# Patient Record
Sex: Female | Born: 1960 | Race: White | Hispanic: No | Marital: Married | Smoking: Never smoker
Health system: Southern US, Community
[De-identification: ages and names within clinical notes are randomized; demographics above are authoritative.]

## PROBLEM LIST (undated history)

## (undated) DIAGNOSIS — R51 Headache: Secondary | ICD-10-CM

## (undated) DIAGNOSIS — E785 Hyperlipidemia, unspecified: Secondary | ICD-10-CM

## (undated) DIAGNOSIS — M8580 Other specified disorders of bone density and structure, unspecified site: Secondary | ICD-10-CM

## (undated) HISTORY — PX: COLONOSCOPY: SHX174

## (undated) HISTORY — DX: Headache: R51

## (undated) HISTORY — DX: Hyperlipidemia, unspecified: E78.5

## (undated) HISTORY — DX: Other specified disorders of bone density and structure, unspecified site: M85.80

---

## 1996-11-10 HISTORY — PX: VAGINAL HYSTERECTOMY: SUR661

## 1998-04-06 ENCOUNTER — Other Ambulatory Visit: Admission: RE | Admit: 1998-04-06 | Discharge: 1998-04-06 | Payer: Self-pay | Admitting: Obstetrics and Gynecology

## 1999-11-05 ENCOUNTER — Ambulatory Visit (HOSPITAL_COMMUNITY): Admission: RE | Admit: 1999-11-05 | Discharge: 1999-11-05 | Payer: Self-pay | Admitting: Obstetrics and Gynecology

## 1999-11-05 ENCOUNTER — Encounter: Payer: Self-pay | Admitting: Obstetrics and Gynecology

## 2000-11-05 ENCOUNTER — Encounter: Payer: Self-pay | Admitting: Obstetrics and Gynecology

## 2000-11-05 ENCOUNTER — Ambulatory Visit (HOSPITAL_COMMUNITY): Admission: RE | Admit: 2000-11-05 | Discharge: 2000-11-05 | Payer: Self-pay | Admitting: Obstetrics and Gynecology

## 2001-04-27 ENCOUNTER — Other Ambulatory Visit: Admission: RE | Admit: 2001-04-27 | Discharge: 2001-04-27 | Payer: Self-pay | Admitting: Obstetrics and Gynecology

## 2001-11-12 ENCOUNTER — Encounter: Payer: Self-pay | Admitting: Obstetrics and Gynecology

## 2001-11-12 ENCOUNTER — Ambulatory Visit (HOSPITAL_COMMUNITY): Admission: RE | Admit: 2001-11-12 | Discharge: 2001-11-12 | Payer: Self-pay | Admitting: Obstetrics and Gynecology

## 2002-04-27 ENCOUNTER — Other Ambulatory Visit: Admission: RE | Admit: 2002-04-27 | Discharge: 2002-04-27 | Payer: Self-pay | Admitting: Obstetrics and Gynecology

## 2002-11-14 ENCOUNTER — Encounter: Payer: Self-pay | Admitting: Obstetrics and Gynecology

## 2002-11-14 ENCOUNTER — Encounter: Admission: RE | Admit: 2002-11-14 | Discharge: 2002-11-14 | Payer: Self-pay | Admitting: Obstetrics and Gynecology

## 2003-05-04 ENCOUNTER — Other Ambulatory Visit: Admission: RE | Admit: 2003-05-04 | Discharge: 2003-05-04 | Payer: Self-pay | Admitting: Obstetrics and Gynecology

## 2003-11-20 ENCOUNTER — Ambulatory Visit (HOSPITAL_COMMUNITY): Admission: RE | Admit: 2003-11-20 | Discharge: 2003-11-20 | Payer: Self-pay | Admitting: Obstetrics and Gynecology

## 2004-05-06 ENCOUNTER — Other Ambulatory Visit: Admission: RE | Admit: 2004-05-06 | Discharge: 2004-05-06 | Payer: Self-pay | Admitting: Obstetrics and Gynecology

## 2004-11-27 ENCOUNTER — Encounter: Admission: RE | Admit: 2004-11-27 | Discharge: 2004-11-27 | Payer: Self-pay | Admitting: Obstetrics and Gynecology

## 2005-02-27 ENCOUNTER — Ambulatory Visit: Payer: Self-pay | Admitting: Family Medicine

## 2005-05-07 ENCOUNTER — Other Ambulatory Visit: Admission: RE | Admit: 2005-05-07 | Discharge: 2005-05-07 | Payer: Self-pay | Admitting: Addiction Medicine

## 2005-11-10 HISTORY — PX: OOPHORECTOMY: SHX86

## 2005-11-10 HISTORY — PX: PELVIC LAPAROSCOPY: SHX162

## 2005-12-01 ENCOUNTER — Encounter: Admission: RE | Admit: 2005-12-01 | Discharge: 2005-12-01 | Payer: Self-pay | Admitting: Obstetrics and Gynecology

## 2006-05-08 ENCOUNTER — Other Ambulatory Visit: Admission: RE | Admit: 2006-05-08 | Discharge: 2006-05-08 | Payer: Self-pay | Admitting: Obstetrics and Gynecology

## 2006-08-10 ENCOUNTER — Ambulatory Visit (HOSPITAL_BASED_OUTPATIENT_CLINIC_OR_DEPARTMENT_OTHER): Admission: RE | Admit: 2006-08-10 | Discharge: 2006-08-10 | Payer: Self-pay | Admitting: Obstetrics and Gynecology

## 2006-08-10 ENCOUNTER — Encounter (INDEPENDENT_AMBULATORY_CARE_PROVIDER_SITE_OTHER): Payer: Self-pay | Admitting: *Deleted

## 2006-12-03 ENCOUNTER — Encounter: Admission: RE | Admit: 2006-12-03 | Discharge: 2006-12-03 | Payer: Self-pay | Admitting: Obstetrics and Gynecology

## 2007-05-10 ENCOUNTER — Other Ambulatory Visit: Admission: RE | Admit: 2007-05-10 | Discharge: 2007-05-10 | Payer: Self-pay | Admitting: Obstetrics and Gynecology

## 2007-06-22 ENCOUNTER — Ambulatory Visit: Payer: Self-pay | Admitting: Internal Medicine

## 2007-07-06 ENCOUNTER — Ambulatory Visit: Payer: Self-pay | Admitting: Internal Medicine

## 2007-12-06 ENCOUNTER — Telehealth (INDEPENDENT_AMBULATORY_CARE_PROVIDER_SITE_OTHER): Payer: Self-pay | Admitting: Internal Medicine

## 2007-12-07 ENCOUNTER — Encounter: Admission: RE | Admit: 2007-12-07 | Discharge: 2007-12-07 | Payer: Self-pay | Admitting: Obstetrics and Gynecology

## 2007-12-08 ENCOUNTER — Ambulatory Visit: Payer: Self-pay | Admitting: Family Medicine

## 2007-12-08 DIAGNOSIS — R51 Headache: Secondary | ICD-10-CM | POA: Insufficient documentation

## 2007-12-08 DIAGNOSIS — K209 Esophagitis, unspecified without bleeding: Secondary | ICD-10-CM | POA: Insufficient documentation

## 2007-12-08 DIAGNOSIS — R519 Headache, unspecified: Secondary | ICD-10-CM | POA: Insufficient documentation

## 2007-12-13 ENCOUNTER — Telehealth (INDEPENDENT_AMBULATORY_CARE_PROVIDER_SITE_OTHER): Payer: Self-pay | Admitting: Internal Medicine

## 2007-12-14 ENCOUNTER — Ambulatory Visit: Payer: Self-pay | Admitting: Family Medicine

## 2008-01-12 ENCOUNTER — Telehealth (INDEPENDENT_AMBULATORY_CARE_PROVIDER_SITE_OTHER): Payer: Self-pay | Admitting: Internal Medicine

## 2008-02-02 ENCOUNTER — Encounter (INDEPENDENT_AMBULATORY_CARE_PROVIDER_SITE_OTHER): Payer: Self-pay | Admitting: Internal Medicine

## 2008-04-25 ENCOUNTER — Telehealth (INDEPENDENT_AMBULATORY_CARE_PROVIDER_SITE_OTHER): Payer: Self-pay | Admitting: Internal Medicine

## 2008-05-10 ENCOUNTER — Other Ambulatory Visit: Admission: RE | Admit: 2008-05-10 | Discharge: 2008-05-10 | Payer: Self-pay | Admitting: Obstetrics and Gynecology

## 2008-05-25 ENCOUNTER — Ambulatory Visit: Payer: Self-pay | Admitting: Family Medicine

## 2008-08-28 ENCOUNTER — Ambulatory Visit: Payer: Self-pay | Admitting: Family Medicine

## 2008-12-07 ENCOUNTER — Encounter: Admission: RE | Admit: 2008-12-07 | Discharge: 2008-12-07 | Payer: Self-pay | Admitting: Obstetrics and Gynecology

## 2009-05-16 ENCOUNTER — Other Ambulatory Visit: Admission: RE | Admit: 2009-05-16 | Discharge: 2009-05-16 | Payer: Self-pay | Admitting: Obstetrics and Gynecology

## 2009-05-16 ENCOUNTER — Encounter: Payer: Self-pay | Admitting: Obstetrics and Gynecology

## 2009-05-16 ENCOUNTER — Ambulatory Visit: Payer: Self-pay | Admitting: Obstetrics and Gynecology

## 2009-10-15 ENCOUNTER — Ambulatory Visit: Payer: Self-pay | Admitting: Family Medicine

## 2009-10-15 ENCOUNTER — Telehealth: Payer: Self-pay | Admitting: Family Medicine

## 2009-10-15 ENCOUNTER — Encounter: Payer: Self-pay | Admitting: Family Medicine

## 2009-10-15 DIAGNOSIS — R1013 Epigastric pain: Secondary | ICD-10-CM | POA: Insufficient documentation

## 2009-10-15 LAB — CONVERTED CEMR LAB
AST: 19 units/L (ref 0–37)
Basophils Relative: 0.6 % (ref 0.0–3.0)
Eosinophils Absolute: 0.1 10*3/uL (ref 0.0–0.7)
HCT: 39.5 % (ref 36.0–46.0)
Hemoglobin: 13.5 g/dL (ref 12.0–15.0)
Lipase: 10 units/L — ABNORMAL LOW (ref 11.0–59.0)
Lymphocytes Relative: 37.5 % (ref 12.0–46.0)
MCHC: 34.1 g/dL (ref 30.0–36.0)
MCV: 96.4 fL (ref 78.0–100.0)
Monocytes Absolute: 0.5 10*3/uL (ref 0.1–1.0)
Neutro Abs: 2.5 10*3/uL (ref 1.4–7.7)
Neutrophils Relative %: 49.2 % (ref 43.0–77.0)
Platelets: 131 10*3/uL — ABNORMAL LOW (ref 150.0–400.0)
RBC: 4.09 M/uL (ref 3.87–5.11)
Total Bilirubin: 0.8 mg/dL (ref 0.3–1.2)
Total Protein: 6.9 g/dL (ref 6.0–8.3)

## 2009-10-17 LAB — CONVERTED CEMR LAB
HDL: 72.5 mg/dL (ref >39.00)
Total CHOL/HDL Ratio: 3
Triglycerides: 71 mg/dL (ref 0.0–149.0)

## 2009-12-10 ENCOUNTER — Encounter: Admission: RE | Admit: 2009-12-10 | Discharge: 2009-12-10 | Payer: Self-pay | Admitting: Obstetrics and Gynecology

## 2010-05-30 ENCOUNTER — Ambulatory Visit: Payer: Self-pay | Admitting: Obstetrics and Gynecology

## 2010-05-30 ENCOUNTER — Other Ambulatory Visit: Admission: RE | Admit: 2010-05-30 | Discharge: 2010-05-30 | Payer: Self-pay | Admitting: Obstetrics and Gynecology

## 2010-11-30 ENCOUNTER — Other Ambulatory Visit: Payer: Self-pay | Admitting: Obstetrics and Gynecology

## 2010-11-30 DIAGNOSIS — Z0000 Encounter for general adult medical examination without abnormal findings: Secondary | ICD-10-CM

## 2010-12-11 ENCOUNTER — Ambulatory Visit
Admission: RE | Admit: 2010-12-11 | Discharge: 2010-12-11 | Disposition: A | Discharge status: Home / Self Care | Payer: Self-pay | Source: Ambulatory Visit | Attending: Obstetrics and Gynecology | Admitting: Obstetrics and Gynecology

## 2010-12-11 DIAGNOSIS — Z0000 Encounter for general adult medical examination without abnormal findings: Secondary | ICD-10-CM

## 2011-01-23 ENCOUNTER — Ambulatory Visit (INDEPENDENT_AMBULATORY_CARE_PROVIDER_SITE_OTHER): Payer: Self-pay | Admitting: Family Medicine

## 2011-01-23 ENCOUNTER — Encounter: Payer: Self-pay | Admitting: Family Medicine

## 2011-01-23 DIAGNOSIS — J018 Other acute sinusitis: Secondary | ICD-10-CM

## 2011-01-28 NOTE — Assessment & Plan Note (Signed)
Summary: COUGH,COLD/CLE  AETNA   Vital Signs:  Patient profile:   50 year old female Height:      64 inches Weight:      121.25 pounds BMI:     20.89 Temp:     98.3 degrees F Pulse rate:   80 / minute Pulse rhythm:   regular BP sitting:   110 / 80  (left arm) Cuff size:   regular  Vitals Entered ByMelody Comas (January 23, 2011 9:09 AM) CC: cough, congestion   History of Present Illness: 50 yo here with 2 weeks of cough and congestion.  Started with runny nose, dry cough. Now has sinus pressure, productive cough, body aches. No fevers or chills. No CP, wheezing or SOB.    Current Medications (verified): 1)  Budeprion Xl 150 Mg  Tb24 (Bupropion Hcl) .... Take 1 Tablet By Mouth Once A Day 2)  Baclofen 10 Mg Tabs (Baclofen) .... Take 1 Tablet By Mouth Once A Day 3)  Topamax 25 Mg Tabs (Topiramate) .... Take 2 By Mouth Once Daily 4)  Calcium Carbonate-Vitamin D 600-400 Mg-Unit  Tabs (Calcium Carbonate-Vitamin D) .... Take 1 Tablet By Mouth Once A Day 5)  Fish Oil   Oil (Fish Oil) .... Take 1 Tablet By Mouth Once A Day 6)  Multivitamins   Tabs (Multiple Vitamin) .... Take 1 Tablet By Mouth Once A Day 7)  Tussionex Pennkinetic Er 8-10 Mg/27ml Lqcr (Chlorpheniramine-Hydrocodone) .... 5 Ml By Mouth Two Times A Day As Needed Cough 8)  Amoxicillin 875 Mg Tabs (Amoxicillin) .Marland Kitchen.. 1 Tab By Mouth Two Times A Day X 10 Days  Allergies (verified): No Known Drug Allergies  Past History:  Social History: Last updated: 12/08/2007 Marital Status: Married Children: 2 adult children Occupation:   Risk Factors: Alcohol Use: <1 (12/08/2007) Caffeine Use: 1 (12/08/2007) Exercise: yes (12/08/2007)  Risk Factors: Smoking Status: never (12/08/2007) Passive Smoke Exposure: no (12/08/2007)  Review of Systems      See HPI General:  Complains of malaise; denies chills and fever. ENT:  Complains of postnasal drainage, sinus pressure, and sore throat. CV:  Denies chest pain or  discomfort. Resp:  Complains of cough and sputum productive; denies shortness of breath and wheezing.  Physical Exam  General:  alert, well-developed, and well-nourished.  NAD VSS Ears:  TMs retracted bilaterally Nose:  boggy turbinates, frontal and maxillary sinuses TTP Mouth:  pharyngeal erythema.   Lungs:  Normal respiratory effort, chest expands symmetrically. Lungs are clear to auscultation, no crackles or wheezes. Heart:  normal rate, regular rhythm, no murmur, and no gallop.   Extremities:  No clubbing, cyanosis, edema, or deformity noted with normal full range of motion of all joints.   Neurologic:  alert & oriented X3 and gait normal.   Skin:  Intact without suspicious lesions or rashes Psych:  Cognition and judgment appear intact. Alert and cooperative with normal attention span and concentration. No apparent delusions, illusions, hallucinations   Impression & Recommendations:  Problem # 1:  OTHER ACUTE SINUSITIS (ICD-461.8) Assessment New Given duration and progression of symptoms, will treat for bacterial sinusitis with Amoxcillin x 10 days. Supportive care as per pt instructions. Her updated medication list for this problem includes:    Tussionex Pennkinetic Er 8-10 Mg/33ml Lqcr (Chlorpheniramine-hydrocodone) .Marland KitchenMarland KitchenMarland KitchenMarland Kitchen 5 ml by mouth two times a day as needed cough    Amoxicillin 875 Mg Tabs (Amoxicillin) .Marland Kitchen... 1 tab by mouth two times a day x 10 days  Complete Medication List: 1)  Budeprion Xl 150 Mg Tb24 (Bupropion hcl) .... Take 1 tablet by mouth once a day 2)  Baclofen 10 Mg Tabs (Baclofen) .... Take 1 tablet by mouth once a day 3)  Topamax 25 Mg Tabs (Topiramate) .... Take 2 by mouth once daily 4)  Calcium Carbonate-vitamin D 600-400 Mg-unit Tabs (Calcium carbonate-vitamin d) .... Take 1 tablet by mouth once a day 5)  Fish Oil Oil (Fish oil) .... Take 1 tablet by mouth once a day 6)  Multivitamins Tabs (Multiple vitamin) .... Take 1 tablet by mouth once a day 7)   Tussionex Pennkinetic Er 8-10 Mg/49ml Lqcr (Chlorpheniramine-hydrocodone) .... 5 ml by mouth two times a day as needed cough 8)  Amoxicillin 875 Mg Tabs (Amoxicillin) .Marland Kitchen.. 1 tab by mouth two times a day x 10 days  Patient Instructions: 1)  Take amoxicillin as directed.  Drink lots of fluids.  Treat sympotmatically with Mucinex, nasal saline irrigation, and Tylenol/Ibuprofen. Also try claritin D or zyrtec D over the counter- two times a day as needed ( have to sign for them at pharmacy). You can use warm compresses.  Cough suppressant at night. Call if not improving as expected in 5-7 days.  Prescriptions: AMOXICILLIN 875 MG TABS (AMOXICILLIN) 1 tab by mouth two times a day x 10 days  #20 x 0   Entered and Authorized by:   Ruthe Mannan MD   Signed by:   Ruthe Mannan MD on 01/23/2011   Method used:   Electronically to        [facility] [address]* (retail)       [address] [place], [place]  [address]       Ph: [phone]       Fax: [phone]   RxID:   [id number] Sandria Senter ER 8-10 MG/5ML LQCR (CHLORPHENIRAMINE-HYDROCODONE) 5 ml by mouth two times a day as needed cough  #5 ounces x 0   Entered and Authorized by:   Ruthe Mannan MD   Signed by:   Ruthe Mannan MD on 01/23/2011   Method used:   Print then Give to Patient   RxID:   [id number]    Orders Added: 1)  Est. Patient Level III [59563]    Current Allergies (reviewed today): No known allergies

## 2011-03-28 NOTE — Op Note (Signed)
Darlene Spencer, Darlene Spencer                ACCOUNT NO.:  [id number]   MEDICAL RECORD NO.:  [id number]          PATIENT TYPE:  AMB   LOCATION:  [facility]                         FACILITY:  [facility]   PHYSICIAN:  Daniel L. Gottsegen, M.D.DATE OF BIRTH:  11-Dec-1960   DATE OF PROCEDURE:  08/10/2006  DATE OF DISCHARGE:                                 OPERATIVE REPORT   PREOPERATIVE DIAGNOSIS:  Right lower quadrant pain, right ovarian neoplasm,  question endometriosis.   POSTOPERATIVE DIAGNOSES:  1. Right lower quadrant pain, right ovarian neoplasm, question      endometriosis.  2. Pelvic adhesions.   NAME OF OPERATION:  Diagnostic laparoscopy. with lysis of pelvic adhesions,  bilateral salpingo-oophorectomy.   SURGEON:  Daniel L. Eda Paschal, M.D.   FIRST ASSISTANT:  Timothy P. Fontaine, M.D.   INDICATIONS:  The patient is a 50 year old female who had presented to the  office with persistent chronic right lower quadrant pain.  She had been  ultrasounded, which showed enlargement of her right ovary, consistent with  an endometrioma, and she did have a previous history of endometriosis.  Her  pain has persisted.  She was re-ultrasounded. The ovarian neoplasm had not  changed in size, and therefore it was elected to proceed with surgery.  Although the patient is only 65, she is postmenopausal.  We had discussed  pros and cons of removing both her ovaries, and she has elected to do both.   FINDINGS:  At the time of surgery, the patient's right ovary was twice as  large is normal.  It appeared consistent with an endometrioma, although we  never opened the specimen, so we could not be sure.  The right ovary was  adherent to the appendix, which also could explain some of the patient's  pain.  The appendix itself appeared to be normal other than its adherence to  the ovary and tube.  The left ovary and tube were normal.  There was a  segment of sigmoid colon adherent into the cul-de-sac, and there was an  area  of endometriosis on the left broad ligament.   PROCEDURE:  After adequate general anesthesia, the patient was placed in  dorsal lithotomy position, prepped and draped in the usual sterile manner.  A Foley catheter was inserted into the patient's bladder, and a sponge stick  was placed in the vagina.  A midline vertical incision was made, and then  the laparoscope was introduced directly using Opti Vision for an atraumatic  entry.  Pneumoperitoneum was created. Two 5 mm ports were placed in the  pelvis in the right and left lower quadrant.  Peritoneal washings were first  obtained.  At this point, the appendix was dissected free from the ovary and  tube.  Bipolar power was used for control of bleeding, as well as a sharp  scissors, and care was taken not to actually cauterize the appendix itself.  At the termination of this, the appendix was now free.  It appeared normal.  The right ureter could be identified, and then the right ovary and tube with  neoplasm was removed using bipolar  current for control of bleeding.  It was  completely separated from all its broad ligament attachments.  Attention was  the next turned to the left side.  We could not see her left ovary and tube  until the sigmoid colon could be dissected free from the bladder flap.  This  was done without difficulty.  The left ureter could then be identified.  The  left ovary and tube were then removed using bipolar current to separate the  attachments, starting at the IP ligament.  This was done atraumatically and  without bleeding.  The one area of endometriosis was cauterized, elevated,  to keep it away from the ureter.  At this point, a 5 mm scope was placed in  the right lower quadrant.  An Endopouch was placed subumbilically, and both  specimens were removed and then separated.  The pelvis was reinspected.  There was no bleeding noted.  All cul-de-sac fluid was removed.  Pneumoperitoneum was evacuated.  The  subumbilical fascial incision was  closed with 0 Vicryl, and then the three skin incisions were closed with  Dermabond.  The patient tolerated the procedure well and left the operating  room in satisfactory condition, draining clear urine from her Foley  catheter.      Daniel L. Eda Paschal, M.D.  Electronically Signed     DLG/MEDQ  D:  08/10/2006  T:  08/10/2006  Job:  [id number]

## 2011-04-18 ENCOUNTER — Encounter: Payer: Self-pay | Admitting: Family Medicine

## 2011-04-21 ENCOUNTER — Other Ambulatory Visit: Payer: Self-pay

## 2011-04-21 ENCOUNTER — Ambulatory Visit (INDEPENDENT_AMBULATORY_CARE_PROVIDER_SITE_OTHER)
Admission: RE | Admit: 2011-04-21 | Discharge: 2011-04-21 | Disposition: A | Discharge status: Home / Self Care | Payer: Medicare HMO | Source: Ambulatory Visit | Attending: Family Medicine | Admitting: Family Medicine

## 2011-04-21 ENCOUNTER — Encounter: Payer: Self-pay | Admitting: Family Medicine

## 2011-04-21 ENCOUNTER — Ambulatory Visit (INDEPENDENT_AMBULATORY_CARE_PROVIDER_SITE_OTHER): Payer: Medicare HMO | Admitting: Family Medicine

## 2011-04-21 VITALS — BP 110/80 | HR 80 | Temp 97.8°F | Wt 122.5 lb

## 2011-04-21 DIAGNOSIS — M549 Dorsalgia, unspecified: Secondary | ICD-10-CM

## 2011-04-21 DIAGNOSIS — M5442 Lumbago with sciatica, left side: Secondary | ICD-10-CM | POA: Insufficient documentation

## 2011-04-21 DIAGNOSIS — Z136 Encounter for screening for cardiovascular disorders: Secondary | ICD-10-CM

## 2011-04-21 NOTE — Progress Notes (Signed)
Addended by: Alvina Chou on: 04/21/2011 12:52 PM   Modules accepted: Orders

## 2011-04-21 NOTE — Patient Instructions (Addendum)
Try to avoid painful activities  as much as possible. Aleve and/or tylenol as needed for pain Let's get xrays today. If not improving at follow-up we will consider ultrasound and/or physical therapy.

## 2011-04-21 NOTE — Progress Notes (Signed)
50 yo here for upper back, neck pain.  Started acutely 1-2 months ago. No known injury or trauma. Feels like it is getting worse.  Sometimes, achy feeling radiates down one or both arms. No UE weakness. No cough, CP or SOB.  Has only tried ASA for it because she was worried it could be her heart when pains radiated down her arm. ASA did not help.  Works at a [employer] account [employer] all day.  ROS: See HPI.  The PMH, PSH, Social History, Family History, Medications, and allergies have been reviewed in [facility], and have been updated if relevant.  Physical exam: BP 110/80  Pulse 80  Temp(Src) 97.8 F (36.6 C) (Oral)  Wt 122 lb 8 oz (55.566 kg) Gen:  Alert, pleasant, NAD Resp:  CTA bilaterally CVS:  RRR, no mrg Msk:  spurling neg, no tightness of paraspinous muscles. Neg arch, neg empty can. Neuro:  UE  Strength normal bilaterally, sensation in tact.  1. Back pain   New. Seems most consistent with arthritis vs other vertebral issue. Will get xray of thoracic and cervical spine today. Symptoms not consistent with cardiac, EKG also NSR today. See pt instructions for details.

## 2011-06-11 ENCOUNTER — Ambulatory Visit (INDEPENDENT_AMBULATORY_CARE_PROVIDER_SITE_OTHER): Payer: Managed Care, Other (non HMO) | Admitting: Obstetrics and Gynecology

## 2011-06-11 ENCOUNTER — Encounter: Payer: Self-pay | Admitting: Obstetrics and Gynecology

## 2011-06-11 ENCOUNTER — Other Ambulatory Visit (HOSPITAL_COMMUNITY)
Admission: RE | Admit: 2011-06-11 | Discharge: 2011-06-11 | Disposition: A | Discharge status: Home / Self Care | Payer: Managed Care, Other (non HMO) | Source: Ambulatory Visit | Attending: Obstetrics and Gynecology | Admitting: Obstetrics and Gynecology

## 2011-06-11 DIAGNOSIS — E789 Disorder of lipoprotein metabolism, unspecified: Secondary | ICD-10-CM

## 2011-06-11 DIAGNOSIS — Z13220 Encounter for screening for lipoid disorders: Secondary | ICD-10-CM

## 2011-06-11 DIAGNOSIS — Z01419 Encounter for gynecological examination (general) (routine) without abnormal findings: Secondary | ICD-10-CM | POA: Insufficient documentation

## 2011-06-11 DIAGNOSIS — Z0000 Encounter for general adult medical examination without abnormal findings: Secondary | ICD-10-CM

## 2011-06-11 NOTE — Progress Notes (Signed)
Addended by: Trellis Paganini on: 06/11/2011 02:51 PM   Modules accepted: Orders

## 2011-06-11 NOTE — Progress Notes (Signed)
06/11/2011: Shaianne's LDL is now up to 176. She continues to have a very good HDL 89. I called her today and I think there is room for her to improve her diet. She will work on this and return in 4 months for followup lipid profile.

## 2011-06-11 NOTE — Progress Notes (Signed)
The patient came to see me today for her annual GYN exam. She is stopped her hormone replacement and although she is symptomatic she seems to be okay without it.she does have some vaginal dryness with intercourse but a lubricant is adequate. She is up-to-date on mammograms and bone density. Her entire past history, family history, social history, and ROS was reviewed and recorded.  Physical exam: HEENT within normal limits. Neck shows no masses. Breasts were examined both sitting and lying. There are no skin changes, they are symmetrical, and there are no masses. Abdomen is soft without guarding rebound or masses. External genitalia are normal. BUN  is normal.vaginal exam shows slightly decreased estrogen effect. Cervix and uterus are absent. Adnexa fails to reveal masses. Rectovaginal exam is confirmatory. Extremities within normal limits.  Impression normal GYN exam, some menopausal symptoms.  Plan: For the moment the patient will stay off her estrogen. Her symptoms discussed above start when she discontinued her estrogen. If she wants to go back on estrogen for symptoms we will try a patch. We check a lipid profile today because she's had a slightly elevated LDL in the past. She will continue yearly mammograms.

## 2011-07-06 ENCOUNTER — Emergency Department (HOSPITAL_COMMUNITY): Payer: Managed Care, Other (non HMO)

## 2011-07-06 ENCOUNTER — Observation Stay (HOSPITAL_COMMUNITY)
Admission: EM | Admit: 2011-07-06 | Discharge: 2011-07-07 | Disposition: A | Discharge status: Home / Self Care | Payer: Managed Care, Other (non HMO) | Attending: Internal Medicine | Admitting: Internal Medicine

## 2011-07-06 DIAGNOSIS — R0989 Other specified symptoms and signs involving the circulatory and respiratory systems: Secondary | ICD-10-CM | POA: Insufficient documentation

## 2011-07-06 DIAGNOSIS — E785 Hyperlipidemia, unspecified: Secondary | ICD-10-CM | POA: Insufficient documentation

## 2011-07-06 DIAGNOSIS — R079 Chest pain, unspecified: Principal | ICD-10-CM | POA: Insufficient documentation

## 2011-07-06 DIAGNOSIS — R110 Nausea: Secondary | ICD-10-CM | POA: Insufficient documentation

## 2011-07-06 DIAGNOSIS — R0609 Other forms of dyspnea: Secondary | ICD-10-CM | POA: Insufficient documentation

## 2011-07-06 DIAGNOSIS — G43909 Migraine, unspecified, not intractable, without status migrainosus: Secondary | ICD-10-CM | POA: Insufficient documentation

## 2011-07-06 LAB — CBC
HCT: 37.9 % (ref 36.0–46.0)
Hemoglobin: 13 g/dL (ref 12.0–15.0)
RDW: 12.8 % (ref 11.5–15.5)
WBC: 10.2 10*3/uL (ref 4.0–10.5)

## 2011-07-06 LAB — BASIC METABOLIC PANEL
CO2: 28 mEq/L (ref 19–32)
Calcium: 9.2 mg/dL (ref 8.4–10.5)
Sodium: 141 mEq/L (ref 135–145)

## 2011-07-06 LAB — DIFFERENTIAL
Basophils Absolute: 0 10*3/uL (ref 0.0–0.1)
Lymphocytes Relative: 5 % — ABNORMAL LOW (ref 12–46)
Neutro Abs: 9 10*3/uL — ABNORMAL HIGH (ref 1.7–7.7)
Neutrophils Relative %: 88 % — ABNORMAL HIGH (ref 43–77)

## 2011-07-06 LAB — CARDIAC PANEL(CRET KIN+CKTOT+MB+TROPI)
CK, MB: 2.9 ng/mL (ref 0.3–4.0)
CK, MB: 1.9 ng/mL (ref 0.3–4.0)
Total CK: 120 U/L (ref 7–177)
Troponin I: <0.3 ng/mL (ref <0.30)

## 2011-07-06 LAB — URINALYSIS, MICROSCOPIC ONLY
Glucose, UA: NEGATIVE mg/dL
Hgb urine dipstick: NEGATIVE
Protein, ur: NEGATIVE mg/dL
Specific Gravity, Urine: 1.002 — ABNORMAL LOW (ref 1.005–1.030)
pH: 6 (ref 5.0–8.0)

## 2011-07-06 LAB — POCT I-STAT TROPONIN I

## 2011-07-06 LAB — CK TOTAL AND CKMB (NOT AT ARMC)
CK, MB: 3.4 ng/mL (ref 0.3–4.0)
Total CK: 204 U/L — ABNORMAL HIGH (ref 7–177)

## 2011-07-06 LAB — TSH: TSH: 0.63 u[IU]/mL (ref 0.350–4.500)

## 2011-07-07 LAB — HEMOGLOBIN A1C: Mean Plasma Glucose: 117 mg/dL — ABNORMAL HIGH (ref <117)

## 2011-07-07 LAB — BASIC METABOLIC PANEL
BUN: 15 mg/dL (ref 6–23)
Chloride: 105 mEq/L (ref 96–112)
Glucose, Bld: 97 mg/dL (ref 70–99)
Potassium: 3.1 mEq/L — ABNORMAL LOW (ref 3.5–5.1)
Sodium: 137 mEq/L (ref 135–145)

## 2011-07-07 LAB — CBC
MCH: 31.5 pg (ref 26.0–34.0)
MCHC: 35.2 g/dL (ref 30.0–36.0)
Platelets: 108 10*3/uL — ABNORMAL LOW (ref 150–400)
RDW: 12.7 % (ref 11.5–15.5)

## 2011-07-07 LAB — LIPID PANEL: Cholesterol: 199 mg/dL (ref 0–200)

## 2011-07-23 NOTE — H&P (Signed)
NAMEMARIGOLD, Darlene Spencer NO.:  [id number]  MEDICAL RECORD NO.:  [id number]  LOCATION:  3712                         FACILITY:  [facility]  PHYSICIAN:  Peggye Pitt, M.D. DATE OF BIRTH:  1961/08/23  DATE OF ADMISSION:  07/06/2011 DATE OF DISCHARGE:                             HISTORY & PHYSICAL   PRIMARY CARE PHYSICIAN:  Ruthe Mannan, MD with [facility].  CHIEF COMPLAINT:  Chest pain.  HISTORY OF PRESENT ILLNESS:  Ms.  Spencer is a very pleasant 50 year old white woman with past medical history only significant for diet- controlled hyperlipidemia and migraine headaches, who presents to the emergency department today with right-sided chest pain that began at around 3 o'clock in the morning.  She states the chest pain woke her up from sleep again at around 3:00 a.m.  Chest pain was located on the right upper chest without radiation.  She states that pain lasted for approximately 3 hours before it disappeared.  She was given aspirin and nitroglycerin in the emergency department and did not have significant relief with either medication.  She did have some mild shortness of breath associated with the chest pain and said that she felt better sitting up, then she did lying down.  She also had some nausea, however, no frank emesis.  She waited at home for about an hour and then finally her husband advised her to come to the emergency department.  We are asked to admit her for further evaluation.  ALLERGIES:  No known drug allergies.  PAST MEDICAL HISTORY:  Significant only for migraine headaches and diet- controlled hyperlipidemia.  HOME MEDICATIONS:  Topamax 10 mg at bedtime.  She also takes baclofen, she is not sure of the dose.  SOCIAL HISTORY:  She denies any alcohol, tobacco, or illicit drug use. She is married and lives with her husband.  FAMILY HISTORY:  Significant for a father who died at age 51 of what was presumed to be a heart attack.  No other  history of heart disease, cancer, strokes, or blood clots.  REVIEW OF SYSTEMS:  Negative except as mentioned in history of present illness.  PHYSICAL EXAMINATION:  VITAL SIGNS:  On admission, blood pressure 98/70, heart rate 81, respirations 15, temperature of 98.6, sats of 99% on 2 liters. GENERAL:  She is alert, awake, and oriented x3 in no distress. HEENT:  Normocephalic, atraumatic.  Her pupils are equal, round, and reactive to light.  She has intact extraocular movements. NECK:  Supple.  No JVD, no lymphadenopathy, no bruits, no goiter. HEART:  Regular rate and rhythm without murmurs, rubs, or gallops. LUNGS:  Clear to auscultation bilaterally. ABDOMEN:  Soft, nontender, nondistended with positive bowel sounds. EXTREMITIES:  No clubbing, cyanosis or edema with positive pulses. NEUROLOGIC:  Grossly intact and nonfocal.  LABORATORY DATA ON ADMISSION:  Sodium 141, potassium 3.6, chloride 106, bicarb 28, BUN 22, creatinine 0.78, glucose of 92.  WBC is 10.2, hemoglobin 13.0, platelets of 129.  First set of cardiac enzymes is normal with a total CK of 204, CK-MB of 3.4, and a troponin 0.01.  A chest x-ray shows no acute distress.  An EKG which unfortunately is not available  for me to review at this time, however, the ED physician, in his notes says that it is normal.  ASSESSMENT AND PLAN: 1. Chest pain.  This is very atypical for coronary artery disease.     She also has no coronary artery disease risk factors other than her     family history and hyperlipidemia.  At this time, I will admit her     as an observation to rule out acute coronary syndrome.  I will     cycle cardiac enzymes and EKGs and we will order 1 stat to     evaluate.  She states her chest pain at time of onset, felt better     with sitting forward which would definitely make her believe that     this is more pleuritic in nature.  A chest x-ray was normal as     stated above.  CT scan of the chest to rule out PE  will not be     ordered as she has very low pretest probability for pulmonary     embolus by Wells criteria, she also denies any calf pain or     swelling.  If she rules out, I will discharge her home in the     morning.  I do not believe she requires a stress test either     inpatient or outpatient at this time. 2. For her diet-controlled hyperlipidemia, we will recheck a fasting     lipid profile in the morning. 3. For migraine headaches, we will continue Topamax and baclofen at     home doses once med rec is performed by pharmacy. 4. For deep vein thrombosis prophylaxis, I will place her on Lovenox.     Peggye Pitt, M.D.     EH/MEDQ  D:  07/06/2011  T:  07/06/2011  Job:  [id number]  cc:   Ruthe Mannan, M.D.  Electronically Signed by Peggye Pitt M.D. on 07/23/2011 02:24:26 PM

## 2011-07-23 NOTE — Discharge Summary (Signed)
  NAMEMADDISEN, Spencer NO.:  [id number]  MEDICAL RECORD NO.:  [id number]  LOCATION:  3712                         FACILITY:  [facility]  PHYSICIAN:  Peggye Pitt, M.D. DATE OF BIRTH:  1960/12/05  DATE OF ADMISSION:  07/06/2011 DATE OF DISCHARGE:  07/07/2011                              DISCHARGE SUMMARY   PRIMARY CARE PHYSICIAN:  Ruthe Mannan, MD  DISCHARGE DIAGNOSES: 1. Chest pain, ruled out for acute coronary syndrome. 2. Diet-controlled hyperlipidemia. 3. Migraine headaches.  DISCHARGE MEDICATIONS: 1. Aspirin 81 mg daily. 2. Baclofen 10 mg at bedtime. 3. Os-Cal D 1 tablet daily. 4. Fish oil over the counter 1 capsule three times a day. 5. Multivitamin 1 tablet daily. 6. Topamax 25 mg, of which she takes 2 tablets twice daily. 7. Wellbutrin 150 mg daily.  DISPOSITION AND FOLLOWUP:  Darlene Spencer will be discharged home today in stable and improved condition.  She will need to follow up with her primary care physician within 2 weeks after discharge.  CONSULTATION THIS HOSPITALIZATION:  None.  IMAGES AND PROCEDURES:  A chest x-ray on July 06, 2011, that showed no acute cardiopulmonary process.  HISTORY AND PHYSICAL:  For complete details, please refer to history and physical on July 06, 2011, but in brief, Darlene Spencer is a pleasant 50- year-old white woman with past medical history only significant for diet- controlled hyperlipidemia and migraine headaches who presented to the emergency department with chest pain that woke her up at about 3 o'clock in the morning.  Her coronary artery disease risk factors include hyperlipidemia and family history and because of this, we are asked to admit her for further evaluation and management.  HOSPITAL COURSE BY PROBLEM:  Chest pain.  She is now ruled out for acute coronary syndrome by the way of two sets of negative cardiac enzymes and EKGs that demonstrated no acute ST or T-wave changes.  Given her only minimal  coronary artery disease risk factors and that her chest pain is very typical, I believe that she does not require further inpatient or outpatient cardiac workup.  We also did not perform a CT of the chest to rule out pulmonary embolism given her very low pretest probability for PE.  She is instructed to follow up with her primary care physician for any further recommendations.  She has been started on a baby aspirin daily.  VITAL SIGNS ON DAY OF DISCHARGE:  Blood pressure 100/65, heart rate 69, respirations 18, sats 97% on room air, and temperature 98.4.     Peggye Pitt, M.D.     EH/MEDQ  D:  07/07/2011  T:  07/07/2011  Job:  [id number]  cc:   Ruthe Mannan, M.D.  Electronically Signed by Peggye Pitt M.D. on 07/23/2011 02:24:18 PM

## 2011-09-29 ENCOUNTER — Other Ambulatory Visit: Payer: Self-pay

## 2011-09-29 MED ORDER — FLUCONAZOLE 200 MG PO TABS: 200.0000 mg | ORAL_TABLET | Freq: Every day | ORAL | Status: AC

## 2011-09-29 NOTE — Telephone Encounter (Signed)
PT GOING ON ANOTHER TRIP THIS YEAR. REQUESTING DIFLUCAN PILLS. YOU GAVE HER GENERIC 200MG  #5 ALMOST 1 YEAR AGO FOR THE SAME REQUEST.

## 2011-09-29 NOTE — Telephone Encounter (Signed)
That is fine 

## 2011-09-29 NOTE — Telephone Encounter (Signed)
PT NOTIFIED RX SENT IN.

## 2011-11-12 ENCOUNTER — Other Ambulatory Visit: Payer: Self-pay | Admitting: Obstetrics and Gynecology

## 2011-11-12 DIAGNOSIS — Z1231 Encounter for screening mammogram for malignant neoplasm of breast: Secondary | ICD-10-CM

## 2011-12-15 ENCOUNTER — Ambulatory Visit: Payer: Managed Care, Other (non HMO)

## 2011-12-22 ENCOUNTER — Ambulatory Visit
Admission: RE | Admit: 2011-12-22 | Discharge: 2011-12-22 | Disposition: A | Discharge status: Home / Self Care | Payer: Managed Care, Other (non HMO) | Source: Ambulatory Visit | Attending: Obstetrics and Gynecology | Admitting: Obstetrics and Gynecology

## 2011-12-22 DIAGNOSIS — Z1231 Encounter for screening mammogram for malignant neoplasm of breast: Secondary | ICD-10-CM

## 2012-06-14 ENCOUNTER — Other Ambulatory Visit: Payer: Self-pay | Admitting: Obstetrics and Gynecology

## 2012-06-14 ENCOUNTER — Encounter: Payer: Managed Care, Other (non HMO) | Admitting: Obstetrics and Gynecology

## 2012-07-02 ENCOUNTER — Encounter: Payer: Self-pay | Admitting: Obstetrics and Gynecology

## 2012-07-02 ENCOUNTER — Ambulatory Visit (INDEPENDENT_AMBULATORY_CARE_PROVIDER_SITE_OTHER): Payer: Managed Care, Other (non HMO) | Admitting: Obstetrics and Gynecology

## 2012-07-02 VITALS — BP 116/70 | Ht 64.0 in | Wt 121.0 lb

## 2012-07-02 DIAGNOSIS — Z01419 Encounter for gynecological examination (general) (routine) without abnormal findings: Secondary | ICD-10-CM

## 2012-07-02 LAB — CBC WITH DIFFERENTIAL/PLATELET
Eosinophils Absolute: 0.1 10*3/uL (ref 0.0–0.7)
Eosinophils Relative: 3 % (ref 0–5)
HCT: 39.4 % (ref 36.0–46.0)
Lymphocytes Relative: 41 % (ref 12–46)
Lymphs Abs: 1.6 10*3/uL (ref 0.7–4.0)
MCH: 30.7 pg (ref 26.0–34.0)
MCV: 88.3 fL (ref 78.0–100.0)
Monocytes Absolute: 0.4 10*3/uL (ref 0.1–1.0)
Monocytes Relative: 10 % (ref 3–12)
Platelets: 161 10*3/uL (ref 150–400)
RBC: 4.46 MIL/uL (ref 3.87–5.11)
WBC: 3.8 10*3/uL — ABNORMAL LOW (ref 4.0–10.5)

## 2012-07-02 LAB — LIPID PANEL
HDL: 72 mg/dL (ref >39)
LDL Cholesterol: 157 mg/dL — ABNORMAL HIGH (ref 0–99)
Total CHOL/HDL Ratio: 3.3 Ratio
VLDL: 10 mg/dL (ref 0–40)

## 2012-07-02 MED ORDER — BUPROPION HCL ER (XL) 150 MG PO TB24: 150.0000 mg | ORAL_TABLET | Freq: Every day | ORAL | Status: DC

## 2012-07-02 NOTE — Patient Instructions (Signed)
Call me for sleep medication if necessary.

## 2012-07-02 NOTE — Progress Notes (Signed)
Patient came to see me today for her annual GYN exam. She stopped her oral estradiol approximately 8 months ago and is doing well without it. She is up-to-date on mammograms. Normal bone density in August, 2009. She has always had normal Pap smears. Her last Pap smear was 2012. Two years ago her cholesterol was slightly elevated with a total to 224 and LDL of 139. Last year it was better with a total of 199 and an LDL of 107. She is having trouble with sleep disturbance. She has no trouble falling  asleep but wakes up after 4 hours and cannot go back to bed. She does not wake up with menopausal symptoms.  She had a vaginal hysterectomy in 1998 for endometriosis. She had a diagnostic laparoscopy with bilateral salpingo-oophorectomy in 2007 for endometriosis. She is having no vaginal bleeding. She is having no pelvic pain.  HEENT: Within normal limits. Kennon Portela present. Neck: No masses. Supraclavicular lymph nodes: Not enlarged. Breasts: Examined in both sitting and lying position. Symmetrical without skin changes or masses. Abdomen: Soft no masses guarding or rebound. No hernias. Pelvic: External within normal limits. BUS within normal limits. Vaginal examination shows good estrogen effect, no cystocele enterocele or rectocele. Cervix and uterus absent. Adnexa within normal limits. Rectovaginal confirmatory. Extremities within normal limits.  Assessment: #1. Sleep disturbance-unlikely menopausal. #2. Endometriosis #3. Slightly elevated cholesterol.  Plan: Discussed Sonata or Intermezzo. She will check cost and let me know. Discussed neurology consult. Continue Wellbutrin. Cholesterol checked-patient's fasted.The new Pap smear guidelines were discussed with the patient. No pap done. Plan followup bone density next year after she has been off her estrogen longer.

## 2012-07-03 LAB — URINALYSIS W MICROSCOPIC + REFLEX CULTURE
Casts: NONE SEEN
Glucose, UA: NEGATIVE mg/dL
Nitrite: NEGATIVE
Protein, ur: NEGATIVE mg/dL
Squamous Epithelial / LPF: NONE SEEN
pH: 7.5 (ref 5.0–8.0)

## 2012-07-06 ENCOUNTER — Other Ambulatory Visit: Payer: Self-pay | Admitting: Obstetrics and Gynecology

## 2012-07-06 DIAGNOSIS — N390 Urinary tract infection, site not specified: Secondary | ICD-10-CM

## 2012-07-06 LAB — URINE CULTURE: Colony Count: 75000

## 2012-07-06 MED ORDER — SULFAMETHOXAZOLE-TRIMETHOPRIM 800-160 MG PO TABS: 1.0000 | ORAL_TABLET | Freq: Two times a day (BID) | ORAL | Status: AC

## 2012-11-22 ENCOUNTER — Other Ambulatory Visit: Payer: Self-pay | Admitting: Gynecology

## 2012-11-22 DIAGNOSIS — Z1231 Encounter for screening mammogram for malignant neoplasm of breast: Secondary | ICD-10-CM

## 2012-12-22 ENCOUNTER — Ambulatory Visit: Payer: Managed Care, Other (non HMO)

## 2012-12-31 ENCOUNTER — Ambulatory Visit
Admission: RE | Admit: 2012-12-31 | Discharge: 2012-12-31 | Disposition: A | Discharge status: Home / Self Care | Payer: Managed Care, Other (non HMO) | Source: Ambulatory Visit | Attending: Gynecology | Admitting: Gynecology

## 2012-12-31 DIAGNOSIS — Z1231 Encounter for screening mammogram for malignant neoplasm of breast: Secondary | ICD-10-CM

## 2013-01-03 ENCOUNTER — Telehealth: Payer: Self-pay | Admitting: *Deleted

## 2013-01-03 MED ORDER — FLUCONAZOLE 150 MG PO TABS: 150.0000 mg | ORAL_TABLET | ORAL | Status: DC | PRN

## 2013-01-03 NOTE — Telephone Encounter (Signed)
(  Dr.G patient) she will leaving for a cruise in a month for 5 days. She asked if you would be willing to prescribe diflucan a couple of pills for her to take on her trip? She gets yeast infections due to being in the water a lot, and would like to be able to enjoy her trip. Please advise

## 2013-01-03 NOTE — Telephone Encounter (Signed)
rx sent

## 2013-01-03 NOTE — Telephone Encounter (Signed)
Okay for Diflucan 150 mg #3 one by mouth when necessary yeast infection

## 2013-01-04 NOTE — Telephone Encounter (Signed)
Pt informed with the below note. 

## 2013-05-09 ENCOUNTER — Ambulatory Visit (INDEPENDENT_AMBULATORY_CARE_PROVIDER_SITE_OTHER): Payer: Managed Care, Other (non HMO) | Admitting: Family Medicine

## 2013-05-09 ENCOUNTER — Encounter: Payer: Self-pay | Admitting: Family Medicine

## 2013-05-09 VITALS — BP 122/82 | HR 68 | Temp 98.4°F | Wt 127.0 lb

## 2013-05-09 DIAGNOSIS — Z136 Encounter for screening for cardiovascular disorders: Secondary | ICD-10-CM

## 2013-05-09 DIAGNOSIS — R079 Chest pain, unspecified: Secondary | ICD-10-CM | POA: Insufficient documentation

## 2013-05-09 LAB — LIPASE: Lipase: 26 U/L (ref 11.0–59.0)

## 2013-05-09 LAB — LIPID PANEL
Cholesterol: 235 mg/dL — ABNORMAL HIGH (ref 0–200)
Total CHOL/HDL Ratio: 3
VLDL: 25 mg/dL (ref 0.0–40.0)

## 2013-05-09 LAB — COMPREHENSIVE METABOLIC PANEL
AST: 18 U/L (ref 0–37)
Albumin: 4.1 g/dL (ref 3.5–5.2)
Alkaline Phosphatase: 74 U/L (ref 39–117)
Glucose, Bld: 83 mg/dL (ref 70–99)
Potassium: 3.7 mEq/L (ref 3.5–5.1)
Sodium: 138 mEq/L (ref 135–145)
Total Bilirubin: 0.4 mg/dL (ref 0.3–1.2)
Total Protein: 7.3 g/dL (ref 6.0–8.3)

## 2013-05-09 LAB — CBC
MCHC: 33 g/dL (ref 30.0–36.0)
MCV: 93.5 fl (ref 78.0–100.0)
Platelets: 125 10*3/uL — ABNORMAL LOW (ref 150.0–400.0)
RDW: 14.1 % (ref 11.5–14.6)

## 2013-05-09 LAB — LDL CHOLESTEROL, DIRECT: Direct LDL: 139.1 mg/dL

## 2013-05-09 NOTE — Progress Notes (Signed)
52 yo here for upper back/chest, neck pain.   I last saw her in June 2012 and it was for a similar complaint.  This episode started acutely over the weekend.  She was not exerting herself.  Had not lifted anything heavy, no trauma.  Right scapular pain, also with left jaw pain and headache.  Some chest tightness.  Not worsened by exertion.  Lasted all day Saturday and through Sunday.  Now only has dull headache.  Does have FH of CAD and h/o HLD.  Lab Results  Component Value Date   CHOL 239* 07/02/2012   HDL 72 07/02/2012   LDLCALC 157* 07/02/2012   LDLDIRECT 127.0 10/15/2009   TRIG 52 07/02/2012   CHOLHDL 3.3 07/02/2012     No UE weakness.  No cough, or SOB.  No nausea, vomiting or diaphoresis   ROS:  See HPI.   The PMH, PSH, Social History, Family History, Medications, and allergies have been reviewed in [facility], and have been updated if relevant.  Physical exam:  BP 122/82  Pulse 68  Temp(Src) 98.4 F (36.9 C)  Wt 127 lb (57.607 kg)  BMI 21.79 kg/m2  LMP 11/10/1996  Gen: Alert, pleasant, NAD  Resp: CTA bilaterally  CVS: RRR, no mrg  Msk: spurling neg, no tightness of paraspinous muscles.  Neg arch, neg empty can.  Neuro: UE Strength normal bilaterally, sensation in tact.   Assessment and Plan: 1. Chest pain radiating to jaw Recurrent, deteriorated. ?GERD but cannot rule out cardiac issue. EKG reassuring- pos for bradycardia only (she is physically active). Check labs today, refer to cards for further work up/stress test. The patient indicates understanding of these issues and agrees with the plan.  - EKG 12-Lead - CBC - Lipase - Comprehensive metabolic panel - Ambulatory referral to Cardiology  2. Screening for ischemic heart disease  - Lipid Panel

## 2013-05-09 NOTE — Patient Instructions (Addendum)
Good to see you. I will call you with your lab results.  Please stop by to see Shirlee Limerick on your way out to set up your referral.

## 2013-05-10 ENCOUNTER — Encounter: Payer: Self-pay | Admitting: Family Medicine

## 2013-05-26 ENCOUNTER — Telehealth: Payer: Self-pay | Admitting: *Deleted

## 2013-05-26 ENCOUNTER — Encounter: Payer: Self-pay | Admitting: Cardiovascular Disease

## 2013-05-26 ENCOUNTER — Ambulatory Visit (INDEPENDENT_AMBULATORY_CARE_PROVIDER_SITE_OTHER): Payer: Managed Care, Other (non HMO) | Admitting: Cardiovascular Disease

## 2013-05-26 VITALS — BP 120/90 | HR 53 | Ht 64.0 in | Wt 127.8 lb

## 2013-05-26 DIAGNOSIS — R079 Chest pain, unspecified: Secondary | ICD-10-CM

## 2013-05-26 DIAGNOSIS — E785 Hyperlipidemia, unspecified: Secondary | ICD-10-CM

## 2013-05-26 DIAGNOSIS — R0602 Shortness of breath: Secondary | ICD-10-CM

## 2013-05-26 MED ORDER — ATORVASTATIN CALCIUM 10 MG PO TABS: 10.0000 mg | ORAL_TABLET | Freq: Every day | ORAL | Status: DC

## 2013-05-26 NOTE — Assessment & Plan Note (Addendum)
Etiology of her chest pain is uncertain though somewhat atypical for angina. She has been exercising since that time with no recurrent or reproducible symptoms. What is concerning is her strong family history of CAD. We spent a significant amount of time discussing treadmill testing, stress echo, also discussed calcium score, and the benefits of each test . She's interested in the CT heart calcium score and we will arrange this in [place] This will help guide whether she has underlying CAD and medical management of her hyperlipidemia and any further testing.Marland Kitchen

## 2013-05-26 NOTE — Patient Instructions (Addendum)
You are doing well. Please start generic lipitor every other day for a few weeks,  Then increase to daily  We will check lipids and liver in three months  We will setup a cardiac Calcium score (CT) in [place]  Please call us if you have new issues that need to be addressed before your next appt.  Your physician wants you to follow-up in: 12 months.  You will receive a reminder letter in the mail two months in advance. If you don't receive a letter, please call our office to schedule the follow-up appointment.

## 2013-05-26 NOTE — Progress Notes (Signed)
Patient ID: Darlene Spencer, female    DOB: [birth year], 52 y.o.   MRN: [id number]  HPI Comments: Darlene Spencer is a very pleasant 52 year old woman with history of hyperlipidemia, strong family history of heart disease with father who passed away from MI at 64, presenting by referral from Dr. Clifton Custard for chest pain, jaw pain, back pain.  She reports that several weeks ago, she had significant chest, jaw and back pain associated with headache. Symptoms started on a Saturday, continued until Sunday late in the day. She took several aspirin. Her headache persisted for at least one week. Since then she has been walking several times per week. She is normal regular walking at moderate level intensity with no recent symptoms of chest pain or shortness of breath.  She did have an episode of chest pain several years ago. She went to the hospital, had lab workup. No stress test done. Everything at that time was reportedly normal. She did not have any subsequent chest pain after that episode.  EKG shows sinus bradycardia with rate 53 beats per minute, no significant ST or T wave changes Total cholesterol 235, LDL 139     Outpatient Encounter Prescriptions as of 05/26/2013  Medication Sig Dispense Refill  . Calcium Carbonate-Vitamin D (TH CALCIUM CARBONATE-VITAMIN D) 600-400 MG-UNIT per tablet Take 1 tablet by mouth daily.        . fish oil-omega-3 fatty acids 1000 MG capsule Take 1 g by mouth daily.        . Multiple Vitamin (MULTIVITAMIN) tablet Take 1 tablet by mouth daily.        Marland Kitchen topiramate (TOPAMAX) 25 MG tablet Take 25 mg by mouth 2 (two) times daily.          Review of Systems  Constitutional: Negative.   HENT: Negative.   Eyes: Negative.   Respiratory: Negative.   Cardiovascular: Positive for chest pain.       Jaw pain, back pain episode  Gastrointestinal: Negative.   Musculoskeletal: Negative.   Skin: Negative.   Neurological: Negative.   Psychiatric/Behavioral: Negative.   All other  systems reviewed and are negative.    BP 120/90  Pulse 53  Ht 5\' 4"  (1.626 m)  Wt 127 lb 12 oz (57.947 kg)  BMI 21.92 kg/m2  LMP 11/10/1996  Physical Exam  Nursing note and vitals reviewed. Constitutional: She is oriented to person, place, and time. She appears well-developed and well-nourished.  HENT:  Head: Normocephalic.  Nose: Nose normal.  Mouth/Throat: Oropharynx is clear and moist.  Eyes: Conjunctivae are normal. Pupils are equal, round, and reactive to light.  Neck: Normal range of motion. Neck supple. No JVD present.  Cardiovascular: Normal rate, regular rhythm, S1 normal, S2 normal, normal heart sounds and intact distal pulses.  Exam reveals no gallop and no friction rub.   No murmur heard. Pulmonary/Chest: Effort normal and breath sounds normal. No respiratory distress. She has no wheezes. She has no rales. She exhibits no tenderness.  Abdominal: Soft. Bowel sounds are normal. She exhibits no distension. There is no tenderness.  Musculoskeletal: Normal range of motion. She exhibits no edema and no tenderness.  Lymphadenopathy:    She has no cervical adenopathy.  Neurological: She is alert and oriented to person, place, and time. Coordination normal.  Skin: Skin is warm and dry. No rash noted. No erythema.  Psychiatric: She has a normal mood and affect. Her behavior is normal. Judgment and thought content normal.    Assessment and Plan

## 2013-05-26 NOTE — Telephone Encounter (Signed)
Pt called back states "never mind".

## 2013-05-26 NOTE — Telephone Encounter (Signed)
Lipitor (generic) is $102.00 is there anything else she can try?

## 2013-05-26 NOTE — Assessment & Plan Note (Signed)
We have talked to her extensively about her hyperlipidemia, strong family history of CAD. Father died of MI in his 27s. She's going to start low-dose cholesterol medication. She will start generic Lipitor 10 mg every other day, titrating up to 10 mg daily. She has  lab work to be done through work at the end of August. Could repeat lipids with LFTs at the end of the year.

## 2013-05-26 NOTE — Telephone Encounter (Signed)
Please see below.

## 2013-05-29 NOTE — Telephone Encounter (Signed)
If generic lipitor expensive with her plan,  Could try simvastatin 20 mg daily Or pravastatin 20 mg daily

## 2013-05-31 ENCOUNTER — Other Ambulatory Visit: Payer: Self-pay | Admitting: Cardiovascular Disease

## 2013-05-31 DIAGNOSIS — R0602 Shortness of breath: Secondary | ICD-10-CM

## 2013-05-31 DIAGNOSIS — R079 Chest pain, unspecified: Secondary | ICD-10-CM

## 2013-06-06 ENCOUNTER — Ambulatory Visit (INDEPENDENT_AMBULATORY_CARE_PROVIDER_SITE_OTHER)
Admission: RE | Admit: 2013-06-06 | Discharge: 2013-06-06 | Disposition: A | Discharge status: Home / Self Care | Payer: Managed Care, Other (non HMO) | Source: Ambulatory Visit | Attending: Cardiovascular Disease | Admitting: Cardiovascular Disease

## 2013-06-06 DIAGNOSIS — R0602 Shortness of breath: Secondary | ICD-10-CM

## 2013-06-06 DIAGNOSIS — R079 Chest pain, unspecified: Secondary | ICD-10-CM

## 2013-06-30 ENCOUNTER — Encounter: Payer: Self-pay | Admitting: Cardiovascular Disease

## 2013-07-04 ENCOUNTER — Ambulatory Visit (INDEPENDENT_AMBULATORY_CARE_PROVIDER_SITE_OTHER): Payer: Managed Care, Other (non HMO) | Admitting: Women's Health

## 2013-07-04 ENCOUNTER — Encounter: Payer: Self-pay | Admitting: Women's Health

## 2013-07-04 VITALS — BP 120/70 | Ht 64.0 in | Wt 128.0 lb

## 2013-07-04 DIAGNOSIS — Z01419 Encounter for gynecological examination (general) (routine) without abnormal findings: Secondary | ICD-10-CM

## 2013-07-04 DIAGNOSIS — Z780 Asymptomatic menopausal state: Secondary | ICD-10-CM

## 2013-07-04 DIAGNOSIS — E785 Hyperlipidemia, unspecified: Secondary | ICD-10-CM | POA: Insufficient documentation

## 2013-07-04 DIAGNOSIS — E7800 Pure hypercholesterolemia, unspecified: Secondary | ICD-10-CM

## 2013-07-04 NOTE — Patient Instructions (Signed)
Health Recommendations for Postmenopausal Women Respected and ongoing research has looked at the most common causes of death, disability, and poor quality of life in postmenopausal women. The causes include heart disease, diseases of blood vessels, diabetes, depression, cancer, and bone loss (osteoporosis). Many things can be done to help lower the chances of developing these and other common problems: CARDIOVASCULAR DISEASE Heart Disease: A heart attack is a medical emergency. Know the signs and symptoms of a heart attack. Below are things women can do to reduce their risk for heart disease.   Do not smoke. If you smoke, quit.  Aim for a healthy weight. Being overweight causes many preventable deaths. Eat a healthy and balanced diet and drink an adequate amount of liquids.  Get moving. Make a commitment to be more physically active. Aim for 30 minutes of activity on most, if not all days of the week.  Eat for heart health. Choose a diet that is low in saturated fat and cholesterol and eliminate trans fat. Include whole grains, vegetables, and fruits. Read and understand the labels on food containers before buying.  Know your numbers. Ask your caregiver to check your blood pressure, cholesterol (total, HDL, LDL, triglycerides) and blood glucose. Work with your caregiver on improving your entire clinical picture.  High blood pressure. Limit or stop your table salt intake (try salt substitute and food seasonings). Avoid salty foods and drinks. Read labels on food containers before buying. Eating well and exercising can help control high blood pressure. STROKE  Stroke is a medical emergency. Stroke may be the result of a blood clot in a blood vessel in the brain or by a brain hemorrhage (bleeding). Know the signs and symptoms of a stroke. To lower the risk of developing a stroke:  Avoid fatty foods.  Quit smoking.  Control your diabetes, blood pressure, and irregular heart rate. THROMBOPHLEBITIS  (BLOOD CLOT) OF THE LEG  Becoming overweight and leading a stationary lifestyle may also contribute to developing blood clots. Controlling your diet and exercising will help lower the risk of developing blood clots. CANCER SCREENING  Breast Cancer: Take steps to reduce your risk of breast cancer.  You should practice "breast self-awareness." This means understanding the normal appearance and feel of your breasts and should include breast self-examination. Any changes detected, no matter how small, should be reported to your caregiver.  After age 40, you should have a clinical breast exam (CBE) every year.  Starting at age 40, you should consider having a mammogram (breast X-ray) every year.  If you have a family history of breast cancer, talk to your caregiver about genetic screening.  If you are at high risk for breast cancer, talk to your caregiver about having an MRI and a mammogram every year.  Intestinal or Stomach Cancer: Tests to consider are a rectal exam, fecal occult blood, sigmoidoscopy, and colonoscopy. Women who are high risk may need to be screened at an earlier age and more often.  Cervical Cancer:  Beginning at age 30, you should have a Pap test every 3 years as long as the past 3 Pap tests have been normal.  If you have had past treatment for cervical cancer or a condition that could lead to cancer, you need Pap tests and screening for cancer for at least 20 years after your treatment.  If you had a hysterectomy for a problem that was not cancer or a condition that could lead to cancer, then you no longer need Pap tests.    If you are between ages 65 and 70, and you have had normal Pap tests going back 10 years, you no longer need Pap tests.  If Pap tests have been discontinued, risk factors (such as a new sexual partner) need to be reassessed to determine if screening should be resumed.  Some medical problems can increase the chance of getting cervical cancer. In these  cases, your caregiver may recommend more frequent screening and Pap tests.  Uterine Cancer: If you have vaginal bleeding after reaching menopause, you should notify your caregiver.  Ovarian cancer: Other than yearly pelvic exams, there are no reliable tests available to screen for ovarian cancer at this time except for yearly pelvic exams.  Lung Cancer: Yearly chest X-rays can detect lung cancer and should be done on high risk women, such as cigarette smokers and women with chronic lung disease (emphysema).  Skin Cancer: A complete body skin exam should be done at your yearly examination. Avoid overexposure to the sun and ultraviolet light lamps. Use a strong sun block cream when in the sun. All of these things are important in lowering the risk of skin cancer. MENOPAUSE Menopause Symptoms: Hormone therapy products are effective for treating symptoms associated with menopause:  Moderate to severe hot flashes.  Night sweats.  Mood swings.  Headaches.  Tiredness.  Loss of sex drive.  Insomnia.  Other symptoms. Hormone replacement carries certain risks, especially in older women. Women who use or are thinking about using estrogen or estrogen with progestin treatments should discuss that with their caregiver. Your caregiver will help you understand the benefits and risks. The ideal dose of hormone replacement therapy is not known. The Food and Drug Administration (FDA) has concluded that hormone therapy should be used only at the lowest doses and for the shortest amount of time to reach treatment goals.  OSTEOPOROSIS Protecting Against Bone Loss and Preventing Fracture: If you use hormone therapy for prevention of bone loss (osteoporosis), the risks for bone loss must outweigh the risk of the therapy. Ask your caregiver about other medications known to be safe and effective for preventing bone loss and fractures. To guard against bone loss or fractures, the following is recommended:  If  you are less than age 50, take 1000 mg of calcium and at least 600 mg of Vitamin D per day.  If you are greater than age 50 but less than age 70, take 1200 mg of calcium and at least 600 mg of Vitamin D per day.  If you are greater than age 70, take 1200 mg of calcium and at least 800 mg of Vitamin D per day. Smoking and excessive alcohol intake increases the risk of osteoporosis. Eat foods rich in calcium and vitamin D and do weight bearing exercises several times a week as your caregiver suggests. DIABETES Diabetes Melitus: If you have Type I or Type 2 diabetes, you should keep your blood sugar under control with diet, exercise and recommended medication. Avoid too many sweets, starchy and fatty foods. Being overweight can make control more difficult. COGNITION AND MEMORY Cognition and Memory: Menopausal hormone therapy is not recommended for the prevention of cognitive disorders such as Alzheimer's disease or memory loss.  DEPRESSION  Depression may occur at any age, but is common in elderly women. The reasons may be because of physical, medical, social (loneliness), or financial problems and needs. If you are experiencing depression because of medical problems and control of symptoms, talk to your caregiver about this. Physical activity and   exercise may help with mood and sleep. Community and volunteer involvement may help your sense of value and worth. If you have depression and you feel that the problem is getting worse or becoming severe, talk to your caregiver about treatment options that are best for you. ACCIDENTS  Accidents are common and can be serious in the elderly woman. Prepare your house to prevent accidents. Eliminate throw rugs, place hand bars in the bath, shower and toilet areas. Avoid wearing high heeled shoes or walking on wet, snowy, and icy areas. Limit or stop driving if you have vision or hearing problems, or you feel you are unsteady with you movements and  reflexes. HEPATITIS C Hepatitis C is a type of viral infection affecting the liver. It is spread mainly through contact with blood from an infected person. It can be treated, but if left untreated, it can lead to severe liver damage over years. Many people who are infected do not know that the virus is in their blood. If you are a "baby-boomer", it is recommended that you have one screening test for Hepatitis C. IMMUNIZATIONS  Several immunizations are important to consider having during your senior years, including:   Tetanus, diptheria, and pertussis booster shot.  Influenza every year before the flu season begins.  Pneumonia vaccine.  Shingles vaccine.  Others as indicated based on your specific needs. Talk to your caregiver about these. Document Released: 12/19/2005 Document Revised: 10/13/2012 Document Reviewed: 08/14/2008 [employer] Patient Information 2014 [employer].  

## 2013-07-04 NOTE — Progress Notes (Signed)
Darlene Spencer 1961/05/02 [id number]    History:    The patient presents for annual exam.  Postmenopausal on no HRT with no complaints. 2009 Normal dexa T score -.7 left hip, FRAX 3.6/.1%. Was started on Lipitor 10mg  this past year for Chol of 230, last check lipid panel normal, total cholesterol 177, HDL 80, triglycerides 72, LDL 83. TVH 1998 for endometriosis, 2007 BSO endometriotic cyst. Negative colonoscopy 2008. Normal Pap and mammogram history.   Past medical history, past surgical history, family history and social history were all reviewed and documented in the EPIC chart. 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 Mother hypertension, father heart disease.   ROS:  A  ROS was performed and pertinent positives and negatives are included in the history.  Exam:  Filed Vitals:   07/04/13 0808  BP: 120/70    General appearance:  Normal Head/Neck:  Normal, without cervical or supraclavicular adenopathy. Thyroid:  Symmetrical, normal in size, without palpable masses or nodularity. Respiratory  Effort:  Normal  Auscultation:  Clear without wheezing or rhonchi Cardiovascular  Auscultation:  Regular rate, without rubs, murmurs or gallops  Edema/varicosities:  Not grossly evident Abdominal  Soft,nontender, without masses, guarding or rebound.  Liver/spleen:  No organomegaly noted  Hernia:  None appreciated  Skin  Inspection:  Grossly normal  Palpation:  Grossly normal Neurologic/psychiatric  Orientation:  Normal with appropriate conversation.  Mood/affect:  Normal  Genitourinary    Breasts: Examined lying and sitting.     Right: Without masses, retractions, discharge or axillary adenopathy.     Left: Without masses, retractions, discharge or axillary adenopathy.   Inguinal/mons:  Normal without inguinal adenopathy  External genitalia:  Normal  BUS/Urethra/Skene's glands:  Normal  Bladder:  Normal  Vagina:  Normal  Cervix/uterus: Absent  Adnexa/parametria:     Rt: Without masses or tenderness.   Lt: Without masses or tenderness.  Anus and perineum: Normal  Digital rectal exam: Normal sphincter tone without palpated masses or tenderness  Assessment/Plan:  52 y.o. M. WF G3P2  for annual exam with no complaints.  TVH with BSO for endometriosis on no HRT Normal DEXA 2009 Hypercholesteremia-primary care manages labs and meds.  Plan: Repeat DEXA, will schedule. Home safety, fall prevention importance of exercise reviewed. SBE's, continue annual mammogram, calcium rich diet, vitamin D 2000 daily encouraged. Continue to increase regular exercise. UA,  Pap normal 2012, new screening guidelines reviewed.    Harrington Challenger WHNP, 9:12 AM 07/04/2013

## 2013-07-05 LAB — URINALYSIS W MICROSCOPIC + REFLEX CULTURE
Casts: NONE SEEN
Glucose, UA: NEGATIVE mg/dL
Nitrite: NEGATIVE
Specific Gravity, Urine: 1.022 (ref 1.005–1.030)
Squamous Epithelial / LPF: NONE SEEN
pH: 5.5 (ref 5.0–8.0)

## 2013-07-18 ENCOUNTER — Encounter: Payer: Self-pay | Admitting: Obstetrics and Gynecology

## 2013-07-26 ENCOUNTER — Telehealth: Payer: Self-pay | Admitting: *Deleted

## 2013-07-26 MED ORDER — FLUCONAZOLE 150 MG PO TABS: 150.0000 mg | ORAL_TABLET | Freq: Once | ORAL | Status: DC

## 2013-07-26 NOTE — Telephone Encounter (Signed)
Ok to call in diflucan 150 with 3 refills, tell her to have a great time on the cruise and wear sunscreen!

## 2013-07-26 NOTE — Telephone Encounter (Signed)
Pt informed with the below note, rx sent. After e-sribed medication I realized the direction was take 1 po once. I called [facility] and told them it should be take 1 tablet prn as needed.

## 2013-07-26 NOTE — Telephone Encounter (Signed)
Pt will be leaving for a cruise in about 3 weeks, she is requesting some diflucan to have on hand in case a yeast infection would occur. Pt is requesting about 4 tablets. Had annual done in August. Please advise

## 2013-08-10 ENCOUNTER — Other Ambulatory Visit: Payer: Self-pay | Admitting: Gynecology

## 2013-08-10 DIAGNOSIS — Z780 Asymptomatic menopausal state: Secondary | ICD-10-CM

## 2013-08-10 DIAGNOSIS — M8580 Other specified disorders of bone density and structure, unspecified site: Secondary | ICD-10-CM

## 2013-08-10 DIAGNOSIS — Z13820 Encounter for screening for osteoporosis: Secondary | ICD-10-CM

## 2013-08-10 HISTORY — DX: Other specified disorders of bone density and structure, unspecified site: M85.80

## 2013-08-30 ENCOUNTER — Encounter: Payer: Self-pay | Admitting: Gynecology

## 2013-08-30 ENCOUNTER — Ambulatory Visit (INDEPENDENT_AMBULATORY_CARE_PROVIDER_SITE_OTHER): Payer: Managed Care, Other (non HMO)

## 2013-08-30 DIAGNOSIS — M899 Disorder of bone, unspecified: Secondary | ICD-10-CM

## 2013-08-30 DIAGNOSIS — Z780 Asymptomatic menopausal state: Secondary | ICD-10-CM

## 2013-08-30 DIAGNOSIS — Z13820 Encounter for screening for osteoporosis: Secondary | ICD-10-CM

## 2013-08-30 DIAGNOSIS — M8580 Other specified disorders of bone density and structure, unspecified site: Secondary | ICD-10-CM

## 2013-09-02 ENCOUNTER — Other Ambulatory Visit: Payer: Self-pay | Admitting: *Deleted

## 2013-09-02 DIAGNOSIS — M8580 Other specified disorders of bone density and structure, unspecified site: Secondary | ICD-10-CM

## 2013-09-15 ENCOUNTER — Other Ambulatory Visit: Payer: Self-pay

## 2013-09-20 ENCOUNTER — Other Ambulatory Visit: Payer: Managed Care, Other (non HMO)

## 2013-09-20 DIAGNOSIS — M8580 Other specified disorders of bone density and structure, unspecified site: Secondary | ICD-10-CM

## 2013-11-30 ENCOUNTER — Other Ambulatory Visit: Payer: Self-pay

## 2013-11-30 DIAGNOSIS — Z1231 Encounter for screening mammogram for malignant neoplasm of breast: Secondary | ICD-10-CM

## 2013-12-08 ENCOUNTER — Other Ambulatory Visit: Payer: Self-pay | Admitting: *Deleted

## 2013-12-08 ENCOUNTER — Other Ambulatory Visit: Payer: Self-pay | Admitting: Cardiovascular Disease

## 2013-12-08 MED ORDER — ATORVASTATIN CALCIUM 10 MG PO TABS: 10.0000 mg | ORAL_TABLET | Freq: Every day | ORAL | Status: DC

## 2013-12-08 NOTE — Telephone Encounter (Signed)
Requested Prescriptions   Signed Prescriptions Disp Refills  . atorvastatin (LIPITOR) 10 MG tablet 30 tablet 6    Sig: Take 1 tablet (10 mg total) by mouth daily.    Authorizing Provider: Minna Merritts    Ordering User: Britt Bottom

## 2014-01-03 ENCOUNTER — Ambulatory Visit
Admission: RE | Admit: 2014-01-03 | Discharge: 2014-01-03 | Disposition: A | Discharge status: Home / Self Care | Payer: Managed Care, Other (non HMO) | Source: Ambulatory Visit

## 2014-01-03 DIAGNOSIS — Z1231 Encounter for screening mammogram for malignant neoplasm of breast: Secondary | ICD-10-CM

## 2014-06-12 ENCOUNTER — Telehealth: Payer: Self-pay | Admitting: *Deleted

## 2014-06-12 MED ORDER — FLUCONAZOLE 150 MG PO TABS: 150.0000 mg | ORAL_TABLET | Freq: Every day | ORAL | Status: DC

## 2014-06-12 NOTE — Addendum Note (Signed)
Addended by: Alen Blew on: 06/12/2014 12:13 PM   Modules accepted: Orders

## 2014-06-12 NOTE — Telephone Encounter (Signed)
Pt informed. Rx sent KW CMA

## 2014-06-12 NOTE — Telephone Encounter (Signed)
Ok to refill Diflucan, tell her to enjoy cruise and wear sunscreen!

## 2014-06-12 NOTE — Telephone Encounter (Signed)
Pt has her annual exam scheduled with you Sept 3. Going on a cruise 8/22. Requesting Diflucan tablets (3) to take with her since she will be in her bathing suit. Pls advise. Thank u

## 2014-07-05 ENCOUNTER — Encounter: Payer: Managed Care, Other (non HMO) | Admitting: Women's Health

## 2014-07-13 ENCOUNTER — Encounter: Payer: Self-pay | Admitting: Women's Health

## 2014-07-13 ENCOUNTER — Ambulatory Visit (INDEPENDENT_AMBULATORY_CARE_PROVIDER_SITE_OTHER): Payer: Managed Care, Other (non HMO) | Admitting: Women's Health

## 2014-07-13 VITALS — BP 118/72 | Ht 64.0 in | Wt 115.0 lb

## 2014-07-13 DIAGNOSIS — Z13220 Encounter for screening for lipoid disorders: Secondary | ICD-10-CM

## 2014-07-13 DIAGNOSIS — B009 Herpesviral infection, unspecified: Secondary | ICD-10-CM

## 2014-07-13 DIAGNOSIS — Z01419 Encounter for gynecological examination (general) (routine) without abnormal findings: Secondary | ICD-10-CM

## 2014-07-13 LAB — COMPREHENSIVE METABOLIC PANEL
ALBUMIN: 4.5 g/dL (ref 3.5–5.2)
ALT: 17 U/L (ref 0–35)
AST: 18 U/L (ref 0–37)
Alkaline Phosphatase: 69 U/L (ref 39–117)
BUN: 15 mg/dL (ref 6–23)
CO2: 26 mEq/L (ref 19–32)
Calcium: 9.3 mg/dL (ref 8.4–10.5)
Chloride: 107 mEq/L (ref 96–112)
Creat: 0.89 mg/dL (ref 0.50–1.10)
GLUCOSE: 99 mg/dL (ref 70–99)
POTASSIUM: 3.8 meq/L (ref 3.5–5.3)
Sodium: 142 mEq/L (ref 135–145)
Total Bilirubin: 0.5 mg/dL (ref 0.2–1.2)
Total Protein: 7.1 g/dL (ref 6.0–8.3)

## 2014-07-13 LAB — CBC WITH DIFFERENTIAL/PLATELET
Basophils Absolute: 0.1 10*3/uL (ref 0.0–0.1)
Basophils Relative: 1 % (ref 0–1)
EOS ABS: 0.1 10*3/uL (ref 0.0–0.7)
Eosinophils Relative: 1 % (ref 0–5)
HCT: 40.7 % (ref 36.0–46.0)
Hemoglobin: 13.3 g/dL (ref 12.0–15.0)
LYMPHS ABS: 1.5 10*3/uL (ref 0.7–4.0)
LYMPHS PCT: 28 % (ref 12–46)
MCH: 30.9 pg (ref 26.0–34.0)
MCHC: 32.7 g/dL (ref 30.0–36.0)
MCV: 94.7 fL (ref 78.0–100.0)
Monocytes Absolute: 0.5 10*3/uL (ref 0.1–1.0)
Monocytes Relative: 9 % (ref 3–12)
NEUTROS PCT: 61 % (ref 43–77)
Neutro Abs: 3.4 10*3/uL (ref 1.7–7.7)
PLATELETS: 151 10*3/uL (ref 150–400)
RBC: 4.3 MIL/uL (ref 3.87–5.11)
RDW: 13.5 % (ref 11.5–15.5)
WBC: 5.5 10*3/uL (ref 4.0–10.5)

## 2014-07-13 LAB — LIPID PANEL
CHOLESTEROL: 170 mg/dL (ref 0–200)
HDL: 76 mg/dL (ref >39)
LDL Cholesterol: 83 mg/dL (ref 0–99)
Total CHOL/HDL Ratio: 2.2 Ratio
Triglycerides: 54 mg/dL (ref <150)
VLDL: 11 mg/dL (ref 0–40)

## 2014-07-13 MED ORDER — ACYCLOVIR 200 MG PO CAPS: 200.0000 mg | ORAL_CAPSULE | Freq: Every day | ORAL | Status: DC

## 2014-07-13 NOTE — Patient Instructions (Signed)
Health Recommendations for Postmenopausal Women Respected and ongoing research has looked at the most common causes of death, disability, and poor quality of life in postmenopausal women. The causes include heart disease, diseases of blood vessels, diabetes, depression, cancer, and bone loss (osteoporosis). Many things can be done to help lower the chances of developing these and other common problems. CARDIOVASCULAR DISEASE Heart Disease: A heart attack is a medical emergency. Know the signs and symptoms of a heart attack. Below are things women can do to reduce their risk for heart disease.   Do not smoke. If you smoke, quit.  Aim for a healthy weight. Being overweight causes many preventable deaths. Eat a healthy and balanced diet and drink an adequate amount of liquids.  Get moving. Make a commitment to be more physically active. Aim for 30 minutes of activity on most, if not all days of the week.  Eat for heart health. Choose a diet that is low in saturated fat and cholesterol and eliminate trans fat. Include whole grains, vegetables, and fruits. Read and understand the labels on food containers before buying.  Know your numbers. Ask your caregiver to check your blood pressure, cholesterol (total, HDL, LDL, triglycerides) and blood glucose. Work with your caregiver on improving your entire clinical picture.  High blood pressure. Limit or stop your table salt intake (try salt substitute and food seasonings). Avoid salty foods and drinks. Read labels on food containers before buying. Eating well and exercising can help control high blood pressure. STROKE  Stroke is a medical emergency. Stroke may be the result of a blood clot in a blood vessel in the brain or by a brain hemorrhage (bleeding). Know the signs and symptoms of a stroke. To lower the risk of developing a stroke:  Avoid fatty foods.  Quit smoking.  Control your diabetes, blood pressure, and irregular heart rate. THROMBOPHLEBITIS  (BLOOD CLOT) OF THE LEG  Becoming overweight and leading a stationary lifestyle may also contribute to developing blood clots. Controlling your diet and exercising will help lower the risk of developing blood clots. CANCER SCREENING  Breast Cancer: Take steps to reduce your risk of breast cancer.  You should practice "breast self-awareness." This means understanding the normal appearance and feel of your breasts and should include breast self-examination. Any changes detected, no matter how small, should be reported to your caregiver.  After age 40, you should have a clinical breast exam (CBE) every year.  Starting at age 40, you should consider having a mammogram (breast X-ray) every year.  If you have a family history of breast cancer, talk to your caregiver about genetic screening.  If you are at high risk for breast cancer, talk to your caregiver about having an MRI and a mammogram every year.  Intestinal or Stomach Cancer: Tests to consider are a rectal exam, fecal occult blood, sigmoidoscopy, and colonoscopy. Women who are high risk may need to be screened at an earlier age and more often.  Cervical Cancer:  Beginning at age 30, you should have a Pap test every 3 years as long as the past 3 Pap tests have been normal.  If you have had past treatment for cervical cancer or a condition that could lead to cancer, you need Pap tests and screening for cancer for at least 20 years after your treatment.  If you had a hysterectomy for a problem that was not cancer or a condition that could lead to cancer, then you no longer need Pap tests.    If you are between ages 65 and 70, and you have had normal Pap tests going back 10 years, you no longer need Pap tests.  If Pap tests have been discontinued, risk factors (such as a new sexual partner) need to be reassessed to determine if screening should be resumed.  Some medical problems can increase the chance of getting cervical cancer. In these  cases, your caregiver may recommend more frequent screening and Pap tests.  Uterine Cancer: If you have vaginal bleeding after reaching menopause, you should notify your caregiver.  Ovarian Cancer: Other than yearly pelvic exams, there are no reliable tests available to screen for ovarian cancer at this time except for yearly pelvic exams.  Lung Cancer: Yearly chest X-rays can detect lung cancer and should be done on high risk women, such as cigarette smokers and women with chronic lung disease (emphysema).  Skin Cancer: A complete body skin exam should be done at your yearly examination. Avoid overexposure to the sun and ultraviolet light lamps. Use a strong sun block cream when in the sun. All of these things are important for lowering the risk of skin cancer. MENOPAUSE Menopause Symptoms: Hormone therapy products are effective for treating symptoms associated with menopause:  Moderate to severe hot flashes.  Night sweats.  Mood swings.  Headaches.  Tiredness.  Loss of sex drive.  Insomnia.  Other symptoms. Hormone replacement carries certain risks, especially in older women. Women who use or are thinking about using estrogen or estrogen with progestin treatments should discuss that with their caregiver. Your caregiver will help you understand the benefits and risks. The ideal dose of hormone replacement therapy is not known. The Food and Drug Administration (FDA) has concluded that hormone therapy should be used only at the lowest doses and for the shortest amount of time to reach treatment goals.  OSTEOPOROSIS Protecting Against Bone Loss and Preventing Fracture If you use hormone therapy for prevention of bone loss (osteoporosis), the risks for bone loss must outweigh the risk of the therapy. Ask your caregiver about other medications known to be safe and effective for preventing bone loss and fractures. To guard against bone loss or fractures, the following is recommended:  If  you are younger than age 50, take 1000 mg of calcium and at least 600 mg of Vitamin D per day.  If you are older than age 50 but younger than age 70, take 1200 mg of calcium and at least 600 mg of Vitamin D per day.  If you are older than age 70, take 1200 mg of calcium and at least 800 mg of Vitamin D per day. Smoking and excessive alcohol intake increases the risk of osteoporosis. Eat foods rich in calcium and vitamin D and do weight bearing exercises several times a week as your caregiver suggests. DIABETES Diabetes Mellitus: If you have type I or type 2 diabetes, you should keep your blood sugar under control with diet, exercise, and recommended medication. Avoid starchy and fatty foods, and too many sweets. Being overweight can make diabetes control more difficult. COGNITION AND MEMORY Cognition and Memory: Menopausal hormone therapy is not recommended for the prevention of cognitive disorders such as Alzheimer's disease or memory loss.  DEPRESSION  Depression may occur at any age, but it is common in elderly women. This may be because of physical, medical, social (loneliness), or financial problems and needs. If you are experiencing depression because of medical problems and control of symptoms, talk to your caregiver about this. Physical   activity and exercise may help with mood and sleep. Community and volunteer involvement may improve your sense of value and worth. If you have depression and you feel that the problem is getting worse or becoming severe, talk to your caregiver about which treatment options are best for you. ACCIDENTS  Accidents are common and can be serious in elderly woman. Prepare your house to prevent accidents. Eliminate throw rugs, place hand bars in bath, shower, and toilet areas. Avoid wearing high heeled shoes or walking on wet, snowy, and icy areas. Limit or stop driving if you have vision or hearing problems, or if you feel you are unsteady with your movements and  reflexes. HEPATITIS C Hepatitis C is a type of viral infection affecting the liver. It is spread mainly through contact with blood from an infected person. It can be treated, but if left untreated, it can lead to severe liver damage over the years. Many people who are infected do not know that the virus is in their blood. If you are a "baby-boomer", it is recommended that you have one screening test for Hepatitis C. IMMUNIZATIONS  Several immunizations are important to consider having during your senior years, including:   Tetanus, diphtheria, and pertussis booster shot.  Influenza every year before the flu season begins.  Pneumonia vaccine.  Shingles vaccine.  Others, as indicated based on your specific needs. Talk to your caregiver about these. Document Released: 12/19/2005 Document Revised: 03/13/2014 Document Reviewed: 08/14/2008 [employer] Patient Information 2015 [employer]. This information is not intended to replace advice given to you by your health care provider. Make sure you discuss any questions you have with your health care provider.  

## 2014-07-13 NOTE — Progress Notes (Signed)
Darlene Spencer 05/20/1961 [id number]    History:    Presents for annual exam.  1988 TVH for endometriosis. 2007 BSO for endometrioma on no HRT. 2014 T score -1.1 and hip average FRAX 5%/0.4%, vitamin D 42. Started on Lipitor in 2014 by cardiologist. Negative colonoscopy 2008. History of normal Paps and mammograms.  Past medical history, past surgical history, family history and social history were all reviewed and documented in the EPIC chart. 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 Father heart disease, mother hypertension.  ROS:  A  12 point ROS was performed and pertinent positives and negatives are included.  Exam:  Filed Vitals:   07/13/14 0832  BP: 118/72    General appearance:  Normal Thyroid:  Symmetrical, normal in size, without palpable masses or nodularity. Respiratory  Auscultation:  Clear without wheezing or rhonchi Cardiovascular  Auscultation:  Regular rate, without rubs, murmurs or gallops  Edema/varicosities:  Not grossly evident Abdominal  Soft,nontender, without masses, guarding or rebound.  Liver/spleen:  No organomegaly noted  Hernia:  None appreciated  Skin  Inspection:  Grossly normal   Breasts: Examined lying and sitting.     Right: Without masses, retractions, discharge or axillary adenopathy.     Left: Without masses, retractions, discharge or axillary adenopathy. Gentitourinary   Inguinal/mons:  Normal without inguinal adenopathy  External genitalia:  Normal  BUS/Urethra/Skene's glands:  Normal  Vagina:  Normal  Cervix:  Absent  Uterus:  Absent  Adnexa/parametria:     Rt: Without masses or tenderness.   Lt: Without masses or tenderness.  Anus and perineum: Normal  Digital rectal exam: Normal sphincter tone without palpated masses or tenderness  Assessment/Plan:  53 y.o. MWF G2P2 for annual exam.    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 SBE's, continue annual 3-D mammogram history of dense breast. Continue regular exercise, calcium rich diet, vitamin D 2000 daily encouraged. CBC, comprehensive metabolic panel, lipid panel, UA,. Will take lab results to cardiologist for Lipitor refill.   Note: This dictation was prepared with Dragon/digital dictation.  Any transcriptional errors that result are unintentional. Huel [facility], 8:55 AM 07/13/2014

## 2014-07-14 LAB — URINALYSIS W MICROSCOPIC + REFLEX CULTURE
BILIRUBIN URINE: NEGATIVE
Bacteria, UA: NONE SEEN
CRYSTALS: NONE SEEN
Casts: NONE SEEN
Glucose, UA: NEGATIVE mg/dL
Hgb urine dipstick: NEGATIVE
Ketones, ur: NEGATIVE mg/dL
Nitrite: NEGATIVE
Protein, ur: NEGATIVE mg/dL
SPECIFIC GRAVITY, URINE: 1.02 (ref 1.005–1.030)
SQUAMOUS EPITHELIAL / LPF: NONE SEEN
UROBILINOGEN UA: 0.2 mg/dL (ref 0.0–1.0)
pH: 6 (ref 5.0–8.0)

## 2014-07-16 LAB — URINE CULTURE: Colony Count: 15000

## 2014-07-19 ENCOUNTER — Telehealth: Payer: Self-pay | Admitting: *Deleted

## 2014-07-19 NOTE — Telephone Encounter (Signed)
Spoke w/ pt.  Advised her that I had spoken w/ Dr. Rockey Situ who recommends that pt contact her PCP and see about getting them to refill meds.  She verbalizes understanding and will call back if we can be of further assistance.

## 2014-07-19 NOTE — Telephone Encounter (Signed)
Patient called and had labs done at her pcp and wants to know if her meds will get refilled with Dr. Rockey Situ. She Cant afford to come in right now due to a high deductible. Please call patient.

## 2014-07-21 ENCOUNTER — Other Ambulatory Visit: Payer: Self-pay | Admitting: Women's Health

## 2014-07-21 MED ORDER — ATORVASTATIN CALCIUM 10 MG PO TABS: 10.0000 mg | ORAL_TABLET | Freq: Every day | ORAL | Status: DC

## 2014-07-21 NOTE — Telephone Encounter (Signed)
Darlene Spencer, [name] to refill this medication for the patient?

## 2014-08-16 ENCOUNTER — Other Ambulatory Visit: Payer: Self-pay | Admitting: Cardiovascular Disease

## 2014-09-04 ENCOUNTER — Other Ambulatory Visit: Payer: Self-pay | Admitting: Women's Health

## 2014-09-11 ENCOUNTER — Encounter: Payer: Self-pay | Admitting: Women's Health

## 2014-11-30 ENCOUNTER — Other Ambulatory Visit: Payer: Self-pay

## 2014-11-30 DIAGNOSIS — Z1231 Encounter for screening mammogram for malignant neoplasm of breast: Secondary | ICD-10-CM

## 2015-01-04 ENCOUNTER — Ambulatory Visit
Admission: RE | Admit: 2015-01-04 | Discharge: 2015-01-04 | Disposition: A | Discharge status: Home / Self Care | Payer: BLUE CROSS/BLUE SHIELD | Source: Ambulatory Visit

## 2015-01-04 DIAGNOSIS — Z1231 Encounter for screening mammogram for malignant neoplasm of breast: Secondary | ICD-10-CM

## 2015-02-02 ENCOUNTER — Other Ambulatory Visit: Payer: Self-pay | Admitting: Women's Health

## 2015-02-08 ENCOUNTER — Encounter: Payer: Self-pay | Admitting: Women's Health

## 2015-02-09 ENCOUNTER — Other Ambulatory Visit: Payer: Self-pay | Admitting: Women's Health

## 2015-02-09 DIAGNOSIS — E7800 Pure hypercholesterolemia, unspecified: Secondary | ICD-10-CM

## 2015-02-09 MED ORDER — ATORVASTATIN CALCIUM 10 MG PO TABS: 10.0000 mg | ORAL_TABLET | Freq: Every day | ORAL | Status: DC

## 2015-03-05 ENCOUNTER — Telehealth: Payer: Self-pay

## 2015-03-05 NOTE — Telephone Encounter (Signed)
Attempted to contact pt; phone lines will not permit dial out.

## 2015-03-05 NOTE — Telephone Encounter (Signed)
Lm on pts vm and advised per Dr Deborra Medina. Instructed pt to contact office and schedule nurse visit if wanting immunization

## 2015-03-05 NOTE — Telephone Encounter (Signed)
Pt left v/m; pt stuck nail in leg on 03/04/15 and wants to know if should get tetanus shot; pt was last seen 05/09/2013 and last tetanus shot on 08/28/2008; no future appt scheduled.Please advise.

## 2015-03-05 NOTE — Telephone Encounter (Signed)
Yes she can come in for tetanus booster.

## 2015-03-29 ENCOUNTER — Encounter: Payer: Self-pay | Admitting: Family Medicine

## 2015-03-30 ENCOUNTER — Emergency Department (HOSPITAL_COMMUNITY)
Admission: EM | Admit: 2015-03-30 | Discharge: 2015-03-30 | Disposition: A | Discharge status: Home / Self Care | Payer: BLUE CROSS/BLUE SHIELD | Attending: Emergency Medicine | Admitting: Emergency Medicine

## 2015-03-30 ENCOUNTER — Emergency Department (HOSPITAL_COMMUNITY): Payer: BLUE CROSS/BLUE SHIELD

## 2015-03-30 ENCOUNTER — Other Ambulatory Visit (INDEPENDENT_AMBULATORY_CARE_PROVIDER_SITE_OTHER): Payer: BLUE CROSS/BLUE SHIELD

## 2015-03-30 ENCOUNTER — Telehealth: Payer: Self-pay

## 2015-03-30 ENCOUNTER — Other Ambulatory Visit: Payer: Self-pay | Admitting: Family Medicine

## 2015-03-30 ENCOUNTER — Encounter (HOSPITAL_COMMUNITY): Payer: Self-pay | Admitting: Neurology

## 2015-03-30 DIAGNOSIS — S80812A Abrasion, left lower leg, initial encounter: Secondary | ICD-10-CM | POA: Diagnosis not present

## 2015-03-30 DIAGNOSIS — Y929 Unspecified place or not applicable: Secondary | ICD-10-CM | POA: Insufficient documentation

## 2015-03-30 DIAGNOSIS — R0789 Other chest pain: Secondary | ICD-10-CM | POA: Diagnosis not present

## 2015-03-30 DIAGNOSIS — X58XXXA Exposure to other specified factors, initial encounter: Secondary | ICD-10-CM | POA: Insufficient documentation

## 2015-03-30 DIAGNOSIS — R7303 Prediabetes: Secondary | ICD-10-CM

## 2015-03-30 DIAGNOSIS — Y939 Activity, unspecified: Secondary | ICD-10-CM | POA: Insufficient documentation

## 2015-03-30 DIAGNOSIS — E785 Hyperlipidemia, unspecified: Secondary | ICD-10-CM

## 2015-03-30 DIAGNOSIS — Z8742 Personal history of other diseases of the female genital tract: Secondary | ICD-10-CM | POA: Diagnosis not present

## 2015-03-30 DIAGNOSIS — R5382 Chronic fatigue, unspecified: Secondary | ICD-10-CM

## 2015-03-30 DIAGNOSIS — M8580 Other specified disorders of bone density and structure, unspecified site: Secondary | ICD-10-CM | POA: Diagnosis not present

## 2015-03-30 DIAGNOSIS — R7309 Other abnormal glucose: Secondary | ICD-10-CM | POA: Diagnosis not present

## 2015-03-30 DIAGNOSIS — R079 Chest pain, unspecified: Secondary | ICD-10-CM | POA: Diagnosis present

## 2015-03-30 DIAGNOSIS — M546 Pain in thoracic spine: Secondary | ICD-10-CM | POA: Diagnosis not present

## 2015-03-30 DIAGNOSIS — Z79899 Other long term (current) drug therapy: Secondary | ICD-10-CM | POA: Insufficient documentation

## 2015-03-30 DIAGNOSIS — Y999 Unspecified external cause status: Secondary | ICD-10-CM | POA: Insufficient documentation

## 2015-03-30 DIAGNOSIS — E780 Pure hypercholesterolemia: Secondary | ICD-10-CM | POA: Insufficient documentation

## 2015-03-30 LAB — COMPREHENSIVE METABOLIC PANEL
ALT: 13 U/L (ref 0–35)
AST: 15 U/L (ref 0–37)
Albumin: 4.2 g/dL (ref 3.5–5.2)
Alkaline Phosphatase: 71 U/L (ref 39–117)
BUN: 13 mg/dL (ref 6–23)
CALCIUM: 9.4 mg/dL (ref 8.4–10.5)
CHLORIDE: 106 meq/L (ref 96–112)
CO2: 28 meq/L (ref 19–32)
Creatinine, Ser: 0.87 mg/dL (ref 0.40–1.20)
GFR: 71.74 mL/min (ref >60.00)
GLUCOSE: 67 mg/dL — AB (ref 70–99)
POTASSIUM: 3.5 meq/L (ref 3.5–5.1)
SODIUM: 139 meq/L (ref 135–145)
Total Bilirubin: 0.6 mg/dL (ref 0.2–1.2)
Total Protein: 7 g/dL (ref 6.0–8.3)

## 2015-03-30 LAB — LIPID PANEL
CHOL/HDL RATIO: 2
Cholesterol: 161 mg/dL (ref 0–200)
HDL: 74.5 mg/dL (ref >39.00)
LDL Cholesterol: 73 mg/dL (ref 0–99)
NonHDL: 86.5
TRIGLYCERIDES: 67 mg/dL (ref 0.0–149.0)
VLDL: 13.4 mg/dL (ref 0.0–40.0)

## 2015-03-30 LAB — BASIC METABOLIC PANEL
Anion gap: 10 (ref 5–15)
BUN: 12 mg/dL (ref 6–20)
CALCIUM: 9.2 mg/dL (ref 8.9–10.3)
CO2: 24 mmol/L (ref 22–32)
Chloride: 106 mmol/L (ref 101–111)
Creatinine, Ser: 1.01 mg/dL — ABNORMAL HIGH (ref 0.44–1.00)
GFR calc Af Amer: >60 mL/min (ref >60)
Glucose, Bld: 102 mg/dL — ABNORMAL HIGH (ref 65–99)
Potassium: 3.5 mmol/L (ref 3.5–5.1)
Sodium: 140 mmol/L (ref 135–145)

## 2015-03-30 LAB — CBC
HEMATOCRIT: 41.4 % (ref 36.0–46.0)
HEMOGLOBIN: 13.8 g/dL (ref 12.0–15.0)
MCH: 31.5 pg (ref 26.0–34.0)
MCHC: 33.3 g/dL (ref 30.0–36.0)
MCV: 94.5 fL (ref 78.0–100.0)
Platelets: 144 10*3/uL — ABNORMAL LOW (ref 150–400)
RBC: 4.38 MIL/uL (ref 3.87–5.11)
RDW: 12.7 % (ref 11.5–15.5)
WBC: 4.5 10*3/uL (ref 4.0–10.5)

## 2015-03-30 LAB — CBC WITH DIFFERENTIAL/PLATELET
Basophils Absolute: 0 10*3/uL (ref 0.0–0.1)
Basophils Relative: 0.6 % (ref 0.0–3.0)
Eosinophils Absolute: 0.1 10*3/uL (ref 0.0–0.7)
Eosinophils Relative: 2.7 % (ref 0.0–5.0)
HCT: 40.1 % (ref 36.0–46.0)
HEMOGLOBIN: 13.7 g/dL (ref 12.0–15.0)
LYMPHS ABS: 1.3 10*3/uL (ref 0.7–4.0)
LYMPHS PCT: 39.7 % (ref 12.0–46.0)
MCHC: 34.2 g/dL (ref 30.0–36.0)
MCV: 92.8 fl (ref 78.0–100.0)
Monocytes Absolute: 0.3 10*3/uL (ref 0.1–1.0)
Monocytes Relative: 10.1 % (ref 3.0–12.0)
NEUTROS ABS: 1.5 10*3/uL (ref 1.4–7.7)
Neutrophils Relative %: 46.9 % (ref 43.0–77.0)
Platelets: 124 10*3/uL — ABNORMAL LOW (ref 150.0–400.0)
RBC: 4.32 Mil/uL (ref 3.87–5.11)
RDW: 13.5 % (ref 11.5–15.5)
WBC: 3.3 10*3/uL — ABNORMAL LOW (ref 4.0–10.5)

## 2015-03-30 LAB — I-STAT TROPONIN, ED
TROPONIN I, POC: 0 ng/mL (ref 0.00–0.08)
Troponin i, poc: 0 ng/mL (ref 0.00–0.08)

## 2015-03-30 LAB — TSH: TSH: 2.26 u[IU]/mL (ref 0.35–4.50)

## 2015-03-30 LAB — TROPONIN I: Troponin I: <0.03 ng/mL (ref <0.031)

## 2015-03-30 LAB — FOLATE

## 2015-03-30 LAB — VITAMIN B12: Vitamin B-12: 449 pg/mL (ref 211–911)

## 2015-03-30 LAB — HEMOGLOBIN A1C: Hgb A1c MFr Bld: 5.3 % (ref 4.6–6.5)

## 2015-03-30 NOTE — ED Notes (Addendum)
Pt reports today middle chest pressure at 1500 and mid to upper back pain for 1 week. On Sunday had v/d. Reports she may have been bitten by a snake on Saturday but is not sure b/c she didn't feel anything. Pt is a x 4. In NAD. No vomiting today but feels nauseated,.

## 2015-03-30 NOTE — Telephone Encounter (Signed)
Darlene Spencer with [facility] called pt had been having mid scapula pain,and vomiting and diarrhea last weekend; no V & D since 03/25/15 but mid scapula pain and pain when takes deep breath continues; pain level is low now. Pt is nauseated today. No SOB. [place] disposition is ED. Pt does not want to go to ED and wants to come to office for EKG only; pt concerned could be heart problem. Spoke with Dr Diona Browner and if no CP or SOB pt could be scheduled Sat Clinic; when I went back to phone to make Darlene Spencer aware; Darlene Spencer advised that pt was having chest pressure and pain now but trying to downplay it. Mariana Single if pt have CP and pressure should go to ED for eval. Darlene Spencer will notify pt.

## 2015-03-30 NOTE — Discharge Instructions (Signed)
Chest Pain (Nonspecific) Keep your scheduled appointment with your primary care physician at [facility] next week. It is often hard to give a diagnosis for the cause of chest pain. There is always a chance that your pain could be related to something serious, such as a heart attack or a blood clot in the lungs. You need to follow up with your doctor. HOME CARE  If antibiotic medicine was given, take it as directed by your doctor. Finish the medicine even if you start to feel better.  For the next few days, avoid activities that bring on chest pain. Continue physical activities as told by your doctor.  Do not use any tobacco products. This includes cigarettes, chewing tobacco, and e-cigarettes.  Avoid drinking alcohol.  Only take medicine as told by your doctor.  Follow your doctor's suggestions for more testing if your chest pain does not go away.  Keep all doctor visits you made. GET HELP IF:  Your chest pain does not go away, even after treatment.  You have a rash with blisters on your chest.  You have a fever. GET HELP RIGHT AWAY IF:   You have more pain or pain that spreads to your arm, neck, jaw, back, or belly (abdomen).  You have shortness of breath.  You cough more than usual or cough up blood.  You have very bad back or belly pain.  You feel sick to your stomach (nauseous) or throw up (vomit).  You have very bad weakness.  You pass out (faint).  You have chills. This is an emergency. Do not wait to see if the problems will go away. Call your local emergency services (911 in [place] Do not drive yourself to the hospital. MAKE SURE YOU:   Understand these instructions.  Will watch your condition.  Will get help right away if you are not doing well or get worse. Document Released: 04/14/2008 Document Revised: 11/01/2013 Document Reviewed: 04/14/2008 [facility] Patient Information 2015 [place], [place]. This information is not intended to replace advice given to you  by your health care provider. Make sure you discuss any questions you have with your health care provider.

## 2015-03-30 NOTE — ED Provider Notes (Signed)
CSN: [id number]     Arrival date & time 03/30/15  1711 History   First MD Initiated Contact with Patient 03/30/15 1923     Chief Complaint  Patient presents with  . Chest Pain  . Back Pain     (Consider location/radiation/quality/duration/timing/severity/associated sxs/prior Treatment) HPI Complains of back pain at midthoracic area onset 6 days ago after working on a deck. Back pain is nonradiating, worse with changing positions improved with sitting upright. She developed anterior chest pain today at 3 PM which lasted 2 hours and resolve spontaneously. She treated herself with aspirin earlier today. Presently asymptomatic. Nothing made chest pain better or worse. No associated shortness of breath nausea or sweatiness. Past Medical History  Diagnosis Date  . Endometriosis 2007    endometriotc cyst Rt.ovary  . Headache(784.0)   . Elevated cholesterol   . Osteopenia 08/2013    T score -1.1 FRAX 5.0%/0.4%   Past Surgical History  Procedure Laterality Date  . Vaginal hysterectomy  1998  . Oophorectomy  2007    BSO  . Pelvic laparoscopy  2007    BSO   Family History  Problem Relation Age of Onset  . Hypertension Mother   . Heart disease Father   . Heart failure Father   . Heart attack Father 37  . Cancer Maternal Grandfather     colon  . Hypertension Maternal Grandfather    History  Substance Use Topics  . Smoking status: Never Smoker   . Smokeless tobacco: Never Used  . Alcohol Use: No     Comment: Rare   OB History    Gravida Para Term Preterm AB TAB SAB Ectopic Multiple Living   3 2 2  1  1   2      Review of Systems  Constitutional: Negative.   HENT: Negative.   Respiratory: Negative.   Cardiovascular: Positive for chest pain.  Gastrointestinal: Negative.   Musculoskeletal: Positive for back pain.  Skin: Positive for wound.       Tiny abrasion on left calf obtain several days ago. Her husband thought she may have been bitten by a snake. Patient has no recall  of a bite. Wound is nonpainful  Neurological: Negative.   Psychiatric/Behavioral: Negative.   All other systems reviewed and are negative.     Allergies  Review of patient's allergies indicates no known allergies.  Home Medications   Prior to Admission medications   Medication Sig Start Date End Date Taking? Authorizing Provider  atorvastatin (LIPITOR) 10 MG tablet Take 1 tablet (10 mg total) by mouth daily. Patient taking differently: Take 10 mg by mouth daily at 6 PM.  02/09/15  Yes Huel Cote, NP  Calcium Carbonate-Vitamin D (TH CALCIUM CARBONATE-VITAMIN D) 600-400 MG-UNIT per tablet Take 1 tablet by mouth daily at 6 PM.    Yes Historical Provider, MD  fish oil-omega-3 fatty acids 1000 MG capsule Take 1 g by mouth daily.     Yes Historical Provider, MD  Multiple Vitamin (MULTIVITAMIN) tablet Take 1 tablet by mouth daily.     Yes Historical Provider, MD  topiramate (TOPAMAX) 100 MG tablet Take 100 mg by mouth daily at 6 PM. 03/28/15  Yes Historical Provider, MD  acyclovir (ZOVIRAX) 200 MG capsule Take 1 capsule (200 mg total) by mouth 5 (five) times daily. Patient not taking: Reported on 03/30/2015 07/13/14   Huel Cote, NP  fluconazole (DIFLUCAN) 150 MG tablet Take 1 tablet (150 mg total) by mouth daily. Patient not taking:  Reported on 03/30/2015 06/12/14   Huel Cote, NP   BP 123/80 mmHg  Pulse 59  Temp(Src) 98.4 F (36.9 C) (Oral)  Resp 15  SpO2 100%  LMP 11/10/1996 Physical Exam  Constitutional: She appears well-developed and well-nourished.  HENT:  Head: Normocephalic and atraumatic.  Eyes: Conjunctivae are normal. Pupils are equal, round, and reactive to light.  Neck: Neck supple. No tracheal deviation present. No thyromegaly present.  Cardiovascular: Normal rate and regular rhythm.   No murmur heard. Pulmonary/Chest: Effort normal and breath sounds normal.  Abdominal: Soft. Bowel sounds are normal. She exhibits no distension. There is no tenderness.   Musculoskeletal: Normal range of motion. She exhibits no edema or tenderness.  Neurological: She is alert. Coordination normal.  Skin: Skin is warm and dry. No rash noted.  2 Mill diameter abrasion at left lateral calf. Nontender.  Psychiatric: She has a normal mood and affect.  Nursing note and vitals reviewed.   ED Course  Procedures (including critical care time) Labs Review Labs Reviewed  CBC - Abnormal; Notable for the following:    Platelets 144 (*)    All other components within normal limits  BASIC METABOLIC PANEL - Abnormal; Notable for the following:    Glucose, Bld 102 (*)    Creatinine, Ser 1.01 (*)    All other components within normal limits  I-STAT TROPOININ, ED    Imaging Review Dg Chest 2 View  03/30/2015   CLINICAL DATA:  Pain between scapulae x 1 week. Chest tightness today, since 1500 hrs. No sob. No headache or dizziness. N/v 5 days ago w/ diarrhea. No fever, cough, or congestion. No htn or diab. Non smoker. No hx mi or stroke.  EXAM: CHEST - 2 VIEW  COMPARISON:  CT 06/06/2013 and earlier studies  FINDINGS: Lungs are clear. Heart size and mediastinal contours are within normal limits. Tortuous aorta. No effusion.  No pneumothorax. Visualized skeletal structures are unremarkable.  IMPRESSION: No acute cardiopulmonary disease.   Electronically Signed   By: Lucrezia Europe M.D.   On: 03/30/2015 17:57     EKG Interpretation   Date/Time:  Friday Mar 30 2015 17:17:26 EDT Ventricular Rate:  60 PR Interval:  154 QRS Duration: 84 QT Interval:  398 QTC Calculation: 398 R Axis:   66 Text Interpretation:  Normal sinus rhythm Normal ECG No significant change  since last tracing Confirmed by Winfred Leeds  MD, Robel Wuertz [phone]) on 03/30/2015  7:26:06 PM     Chest x-ray viewed by me Results for orders placed or performed during the hospital encounter of 03/30/15  CBC  Result Value Ref Range   WBC 4.5 4.0 - 10.5 K/uL   RBC 4.38 3.87 - 5.11 MIL/uL   Hemoglobin 13.8 12.0 - 15.0  g/dL   HCT 41.4 36.0 - 46.0 %   MCV 94.5 78.0 - 100.0 fL   MCH 31.5 26.0 - 34.0 pg   MCHC 33.3 30.0 - 36.0 g/dL   RDW 12.7 11.5 - 15.5 %   Platelets 144 (L) 150 - 400 K/uL  Basic metabolic panel  Result Value Ref Range   Sodium 140 135 - 145 mmol/L   Potassium 3.5 3.5 - 5.1 mmol/L   Chloride 106 101 - 111 mmol/L   CO2 24 22 - 32 mmol/L   Glucose, Bld 102 (H) 65 - 99 mg/dL   BUN 12 6 - 20 mg/dL   Creatinine, Ser 1.01 (H) 0.44 - 1.00 mg/dL   Calcium 9.2 8.9 - 10.3 mg/dL  GFR calc non Af Amer >60 >60 mL/min   GFR calc Af Amer >60 >60 mL/min   Anion gap 10 5 - 15  Troponin I  Result Value Ref Range   Troponin I <0.03 <0.031 ng/mL  I-stat troponin, ED  (not at [facility])  Result Value Ref Range   Troponin i, poc 0.00 0.00 - 0.08 ng/mL   Comment 3          I-stat troponin, ED  Result Value Ref Range   Troponin i, poc 0.00 0.00 - 0.08 ng/mL   Comment 3           Dg Chest 2 View  03/30/2015   CLINICAL DATA:  Pain between scapulae x 1 week. Chest tightness today, since 1500 hrs. No sob. No headache or dizziness. N/v 5 days ago w/ diarrhea. No fever, cough, or congestion. No htn or diab. Non smoker. No hx mi or stroke.  EXAM: CHEST - 2 VIEW  COMPARISON:  CT 06/06/2013 and earlier studies  FINDINGS: Lungs are clear. Heart size and mediastinal contours are within normal limits. Tortuous aorta. No effusion.  No pneumothorax. Visualized skeletal structures are unremarkable.  IMPRESSION: No acute cardiopulmonary disease.   Electronically Signed   By: Lucrezia Europe M.D.   On: 03/30/2015 17:57    MDM  Cardiac risk factors hypercholesterolemia otherwise negative heart score equals 2 based on age and single risk factor. Story highly atypical, normal EKG Final diagnoses:  None   back pain is felt to be musculoskeletal in etiology And she is to keep her scheduled appointment with her primary care physician [facility] office next week Diagnosis atypical chest pain     Orlie Dakin, MD 03/30/15 2101

## 2015-03-30 NOTE — Telephone Encounter (Signed)
PLEASE NOTE: All timestamps contained within this report are represented as [place] Standard Time. CONFIDENTIALTY NOTICE: This fax transmission is intended only for the addressee. It contains information that is legally privileged, confidential or otherwise protected from use or disclosure. If you are not the intended recipient, you are strictly prohibited from reviewing, disclosing, copying using or disseminating any of this information or taking any action in reliance on or regarding this information. If you have received this fax in error, please notify us immediately by telephone so that we can arrange for its return to [place]. Phone: [phone], Toll-Free: [phone], Fax: [phone] Page: 1 of 2 Call Id: [id number] [place] Patient Name: Darlene Spencer Gender: Female DOB: Mar 23, 1961 Age: 54 Y 4 M 23 D Return Phone Number: [phone] (Primary) Address: [address] City/State/Zip: [place] [address] Client [facility] - Client Client Site [facility] - Day Physician Arnette Norris Contact Type Call Call Type Triage / Clinical Relationship To Patient Self Appointment Disposition EMR Appointment Not Necessary Info pasted into Epic Yes Return Phone Number [phone] (Primary) Chief Complaint CHEST PAIN (>=21 years) - pain, pressure, heaviness or tightness Initial Comment Caller states has been having pain in upper back, and nausea, wants to make sure there is nothing wrong with her heart. Having heart pressure , has to take a deeper breath to breathe [place] refuses to go to the ED PCP RN will discuss with a MD Caller states she will likely go to [facility] PreDisposition Call Doctor Nurse Assessment Nurse: Venetia Maxon, RN, Manuela Schwartz Date/Time (Eastern Time): 03/30/2015 2:47:17 PM Confirm and document reason for  call. If symptomatic, describe symptoms. ---Caller states has been having pain in upper back, and nausea, wants to make sure there is nothing wrong with her heart. Having heart pressure , has to take a deeper breath to breathe She got really sick over weekened V/D weak since then. Had pain in shoulder blades never had a fever. Last urine was 57min. Has the patient traveled out of the country within the last 30 days? ---No Does the patient require triage? ---Yes Related visit to physician within the last 2 weeks? ---No Does the PT have any chronic conditions? (i.e. diabetes, asthma, etc.) ---Yes List chronic conditions. ---cholesterol Guidelines Guideline Title Affirmed Question Affirmed Notes Nurse Date/Time (Eastern Time) Chest Pain Pain also present in shoulder(s) or arm(s) or jaw (Exception: pain is clearly made worse by movement) Venetia Maxon, RN, Manuela Schwartz 03/30/2015 2:49:47 PM PLEASE NOTE: All timestamps contained within this report are represented as [place] Standard Time. CONFIDENTIALTY NOTICE: This fax transmission is intended only for the addressee. It contains information that is legally privileged, confidential or otherwise protected from use or disclosure. If you are not the intended recipient, you are strictly prohibited from reviewing, disclosing, copying using or disseminating any of this information or taking any action in reliance on or regarding this information. If you have received this fax in error, please notify us immediately by telephone so that we can arrange for its return to [place]. Phone: [phone], Toll-Free: [phone], Fax: [phone] Page: 2 of 2 Call Id: [id number] [place]. Time Eilene Ghazi Time) Disposition Final User 03/30/2015 2:44:45 PM Send to Urgent Toni Arthurs 03/30/2015 2:51:19 PM Go to ED Now Yes Venetia Maxon, RN, Edwena Bunde Understands: Yes Disagree/Comply: Comply Care Advice Given Per Guideline GO TO ED NOW: You need to be seen  in the  Emergency Department. Go to the ER at ___________ [place] now. Drive carefully. * Another adult should drive. CARE ADVICE given per Chest Pain (Adult) guideline. * You become worse. After Care Instructions Given Call Event Type User Date / Time Description Comments User: Willey Blade, RN Date/Time Eilene Ghazi Time): 03/30/2015 2:58:33 PM Called PCP office and spoke with nurse and pt has a cardiologist Dr Donnetta Hail has not seen him in a few years. User: Willey Blade, RN Date/Time Eilene Ghazi Time): 03/30/2015 3:11:00 PM Per MD in the office and due to chest pressure and pain with deep breath advised caller and she will call spouse . Referrals GO TO FACILITY UNDECIDED

## 2015-03-30 NOTE — Telephone Encounter (Signed)
Agree with ER dispo. 

## 2015-03-30 NOTE — Telephone Encounter (Signed)
[facility]     Patient Name: LINDEN MIKES Initial Comment Caller states has been having pain in upper back, and nausea, wants to make sure there is nothing wrong with her heart. Having heart pressure , has to take a deeper breath to breathe   DOB: 10/12/1961      Nurse Assessment  Nurse: Venetia Maxon, RN, Manuela Schwartz Date/Time (Eastern Time): 03/30/2015 2:47:17 PM  Confirm and document reason for call. If symptomatic, describe symptoms. ---Caller states has been having pain in upper back, and nausea, wants to make sure there is nothing wrong with her heart. Having heart pressure , has to take a deeper breath to breathe She got really sick over weekened V/D weak since then. Had pain in shoulder blades never had a fever. Last urine was 43min.  Has the patient traveled out of the country within the last 30 days? ---No  Does the patient require triage? ---Yes  Related visit to physician within the last 2 weeks? ---No  Does the PT have any chronic conditions? (i.e. diabetes, asthma, etc.) ---Yes  List chronic conditions. ---cholesterol    Guidelines     Guideline Title Affirmed Question Affirmed Notes   Chest Pain Pain also present in shoulder(s) or arm(s) or jaw (Exception: pain is clearly made worse by movement)    Final Disposition User   Go to ED Now Venetia Maxon, RN, Theodoro Clock PCP office and spoke with nurse and pt has a cardiologist Dr Donnetta Hail has not seen him in a few years.   Per MD in the office and due to chest pressure and pain with deep breath advised caller and she will call spouse

## 2015-04-05 ENCOUNTER — Ambulatory Visit (INDEPENDENT_AMBULATORY_CARE_PROVIDER_SITE_OTHER): Payer: BLUE CROSS/BLUE SHIELD | Admitting: Family Medicine

## 2015-04-05 ENCOUNTER — Encounter: Payer: Self-pay | Admitting: Family Medicine

## 2015-04-05 VITALS — BP 106/66 | HR 51 | Temp 97.5°F | Ht 63.75 in | Wt 115.2 lb

## 2015-04-05 DIAGNOSIS — R079 Chest pain, unspecified: Secondary | ICD-10-CM

## 2015-04-05 DIAGNOSIS — D696 Thrombocytopenia, unspecified: Secondary | ICD-10-CM

## 2015-04-05 DIAGNOSIS — Z0000 Encounter for general adult medical examination without abnormal findings: Secondary | ICD-10-CM | POA: Diagnosis not present

## 2015-04-05 DIAGNOSIS — E785 Hyperlipidemia, unspecified: Secondary | ICD-10-CM

## 2015-04-05 DIAGNOSIS — G43909 Migraine, unspecified, not intractable, without status migrainosus: Secondary | ICD-10-CM | POA: Insufficient documentation

## 2015-04-05 DIAGNOSIS — G43809 Other migraine, not intractable, without status migrainosus: Secondary | ICD-10-CM

## 2015-04-05 DIAGNOSIS — K209 Esophagitis, unspecified without bleeding: Secondary | ICD-10-CM

## 2015-04-05 DIAGNOSIS — E7800 Pure hypercholesterolemia, unspecified: Secondary | ICD-10-CM

## 2015-04-05 DIAGNOSIS — G43009 Migraine without aura, not intractable, without status migrainosus: Secondary | ICD-10-CM | POA: Insufficient documentation

## 2015-04-05 DIAGNOSIS — Z862 Personal history of diseases of the blood and blood-forming organs and certain disorders involving the immune mechanism: Secondary | ICD-10-CM | POA: Insufficient documentation

## 2015-04-05 MED ORDER — ATORVASTATIN CALCIUM 10 MG PO TABS: 10.0000 mg | ORAL_TABLET | Freq: Every day | ORAL | Status: DC

## 2015-04-05 NOTE — Progress Notes (Signed)
Pre visit review using our clinic review tool, if applicable. No additional management support is needed unless otherwise documented below in the visit note. 

## 2015-04-05 NOTE — Assessment & Plan Note (Signed)
Well controlled on current dose of lipitor. No changes made today. 

## 2015-04-05 NOTE — Assessment & Plan Note (Signed)
Resolved. Neg work up.  Significant neg cardiac work up two years ago.

## 2015-04-05 NOTE — Progress Notes (Signed)
Subjective:   Patient ID: Darlene Spencer, female    DOB: 11/29/1960, 54 y.o.   MRN: [id number]  Darlene Spencer is a pleasant 54 y.o. year old female who presents to clinic today with Annual Exam and Fatigue  and follow up of chronic medical conditions on 04/05/2015  HPI: Colonoscopy  8/26/08[name]- 10 year recall Mammogram 01/04/15 Remote h/o hysterectomy, had BME done by GYN Elon Alas) on 07/13/14  CP- was seen in ER 5/20 for CP- note reviewed. Neg troponin, EKG reassuring- NSR.  Told it was atypical/msk. Did see Dr. Rockey Situ in 05/2013.  Note reviewed. Has not seen him since.  She tells me today that she did not have CP at that time, just needed to "get on cholesterol medication.  HLD- taking lipitor. Lab Results  Component Value Date   CHOL 161 03/30/2015   HDL 74.50 03/30/2015   LDLCALC 73 03/30/2015   LDLDIRECT 139.1 05/09/2013   TRIG 67.0 03/30/2015   CHOLHDL 2 03/30/2015   Migraines- only gets "one in a blue moon," now that she is taking Topamax 100 mg daily.  Fatigue- improved.  Thinks she was bitten by a spider two weeks ago.  Vomited once and was fatigued for a week after. Fatigue has since resolved.  Lab Results  Component Value Date   HGBA1C 5.3 03/30/2015   Lab Results  Component Value Date   TSH 2.26 03/30/2015    Platelets- mildly low.   Lab Results  Component Value Date   WBC 4.5 03/30/2015   HGB 13.8 03/30/2015   HCT 41.4 03/30/2015   MCV 94.5 03/30/2015   PLT 144* 03/30/2015   Lab Results  Component Value Date   NA 140 03/30/2015   K 3.5 03/30/2015   CL 106 03/30/2015   CO2 24 03/30/2015   Lab Results  Component Value Date   CREATININE 1.01* 03/30/2015   Lab Results  Component Value Date   CHOL 161 03/30/2015   HDL 74.50 03/30/2015   LDLCALC 73 03/30/2015   LDLDIRECT 139.1 05/09/2013   TRIG 67.0 03/30/2015   CHOLHDL 2 03/30/2015   Lab Results  Component Value Date   VITAMINB12 449 03/30/2015   Current Outpatient  Prescriptions on File Prior to Visit  Medication Sig Dispense Refill  . Calcium Carbonate-Vitamin D (TH CALCIUM CARBONATE-VITAMIN D) 600-400 MG-UNIT per tablet Take 1 tablet by mouth daily at 6 PM.     . fish oil-omega-3 fatty acids 1000 MG capsule Take 1 g by mouth daily.      . Multiple Vitamin (MULTIVITAMIN) tablet Take 1 tablet by mouth daily.      Marland Kitchen topiramate (TOPAMAX) 100 MG tablet Take 100 mg by mouth daily at 6 PM.     No current facility-administered medications on file prior to visit.    No Known Allergies  Past Medical History  Diagnosis Date  . Endometriosis 2007    endometriotc cyst Rt.ovary  . Headache(784.0)   . Elevated cholesterol   . Osteopenia 08/2013    T score -1.1 FRAX 5.0%/0.4%    Past Surgical History  Procedure Laterality Date  . Vaginal hysterectomy  1998  . Oophorectomy  2007    BSO  . Pelvic laparoscopy  2007    BSO    Family History  Problem Relation Age of Onset  . Hypertension Mother   . Heart disease Father   . Heart failure Father   . Heart attack Father 110  . Cancer Maternal Grandfather  colon  . Hypertension Maternal Grandfather     History   Social History  . Marital Status: Married    Spouse Name: N/A  . Number of Children: 2  . Years of Education: N/A   Occupational History  .     Social History Main Topics  . Smoking status: Never Smoker   . Smokeless tobacco: Never Used  . Alcohol Use: No     Comment: Rare  . Drug Use: No  . Sexual Activity: Yes    Birth Control/ Protection: Surgical   Other Topics Concern  . Not on file   Social History Narrative   The PMH, PSH, Social History, Family History, Medications, and allergies have been reviewed in [facility], and have been updated if relevant.   Review of Systems  Constitutional: Negative.   HENT: Negative.   Eyes: Negative.   Respiratory: Negative.   Cardiovascular: Negative.   Gastrointestinal: Negative.   Endocrine: Negative.   Genitourinary: Negative.     Musculoskeletal: Negative.   Skin: Negative.   Allergic/Immunologic: Negative.   Neurological: Negative.   Hematological: Negative.   Psychiatric/Behavioral: Negative.        Objective:    BP 106/66 mmHg  Pulse 51  Temp(Src) 97.5 F (36.4 C) (Oral)  Ht 5' 3.75" (1.619 m)  Wt 115 lb 4 oz (52.277 kg)  BMI 19.94 kg/m2  SpO2 98%  LMP 11/10/1996   Physical Exam  Constitutional: She is oriented to person, place, and time. She appears well-developed and well-nourished. No distress.  HENT:  Head: Normocephalic and atraumatic.  Eyes: Conjunctivae are normal.  Neck: Normal range of motion.  Cardiovascular: Normal rate and regular rhythm.   Pulmonary/Chest: Effort normal and breath sounds normal.  Abdominal: Soft.  Musculoskeletal: Normal range of motion. She exhibits no edema.  Neurological: She is alert and oriented to person, place, and time. No cranial nerve deficit.  Skin: Skin is warm.  Sun spots throughout  Psychiatric: She has a normal mood and affect. Her behavior is normal. Judgment and thought content normal.  Nursing note and vitals reviewed.         Assessment & Plan:   Pure hypercholesterolemia - Plan: atorvastatin (LIPITOR) 10 MG tablet  Well woman exam (no gynecological exam)  Chest pain radiating to jaw  Hyperlipidemia  Hypercholesteremia  Esophagitis No Follow-up on file.

## 2015-04-05 NOTE — Assessment & Plan Note (Signed)
Well controlled with Topamax 100 mg nightly for migraine prophlyaxis. No changes made.

## 2015-04-05 NOTE — Assessment & Plan Note (Signed)
Mild and likely due to ASA use. No occult bleeding. Recheck CBC in 6 months. The patient indicates understanding of these issues and agrees with the plan.

## 2015-04-05 NOTE — Patient Instructions (Signed)
Good to see you. Please make a lab appointment to get your blood counts rechecked (CBC) in 6 months.

## 2015-04-05 NOTE — Assessment & Plan Note (Signed)
Reviewed preventive care protocols, scheduled due services, and updated immunizations Discussed nutrition, exercise, diet, and healthy lifestyle.  

## 2015-05-03 ENCOUNTER — Telehealth: Payer: Self-pay | Admitting: *Deleted

## 2015-05-03 MED ORDER — FLUCONAZOLE 150 MG PO TABS: 150.0000 mg | ORAL_TABLET | Freq: Once | ORAL | Status: DC

## 2015-05-03 NOTE — Telephone Encounter (Signed)
Diflucan 150 by mouth 1 dose office visit if no relief.

## 2015-05-03 NOTE — Telephone Encounter (Signed)
Pt called c/o yeast infection itching and white discharge. Pt stats you normally send Rx for diflucan with a couple of pills to have. Please advise

## 2015-05-03 NOTE — Telephone Encounter (Signed)
Pt informed, rx sent 

## 2015-05-16 ENCOUNTER — Telehealth: Payer: Self-pay | Admitting: Family Medicine

## 2015-05-16 NOTE — Telephone Encounter (Signed)
Pt came in and dropped off [place] ppw for her insurance company. She said she can fill out the waist measurement part and also asked if the bp result could be the lowest result she had (which was 106/66). The best number to reach her at is [phone]. Placing on Gina's desk.

## 2015-05-17 NOTE — Telephone Encounter (Signed)
Form placed on Dr. Hulen Shouts desk for completion.

## 2015-05-17 NOTE — Telephone Encounter (Signed)
Form completed and given to [facility].

## 2015-05-21 NOTE — Telephone Encounter (Signed)
Pt called checking on paperwork [phone]

## 2015-05-22 NOTE — Telephone Encounter (Signed)
Spoke to pt and informed her paperwork faxed to requested party. Pt requesting copy; placed at front desk for her to pickup

## 2015-05-22 NOTE — Telephone Encounter (Signed)
Pt called back checking on paperwork [phone]

## 2015-07-26 ENCOUNTER — Ambulatory Visit (INDEPENDENT_AMBULATORY_CARE_PROVIDER_SITE_OTHER): Payer: BLUE CROSS/BLUE SHIELD | Admitting: Women's Health

## 2015-07-26 ENCOUNTER — Encounter: Payer: Self-pay | Admitting: Women's Health

## 2015-07-26 VITALS — BP 118/80 | Ht 63.0 in | Wt 117.0 lb

## 2015-07-26 DIAGNOSIS — G4700 Insomnia, unspecified: Secondary | ICD-10-CM | POA: Diagnosis not present

## 2015-07-26 DIAGNOSIS — Z01419 Encounter for gynecological examination (general) (routine) without abnormal findings: Secondary | ICD-10-CM | POA: Diagnosis not present

## 2015-07-26 LAB — CBC WITH DIFFERENTIAL/PLATELET
BASOS PCT: 1 % (ref 0–1)
Basophils Absolute: 0 10*3/uL (ref 0.0–0.1)
Eosinophils Absolute: 0.1 10*3/uL (ref 0.0–0.7)
Eosinophils Relative: 2 % (ref 0–5)
HEMATOCRIT: 41.1 % (ref 36.0–46.0)
HEMOGLOBIN: 13.9 g/dL (ref 12.0–15.0)
LYMPHS PCT: 37 % (ref 12–46)
Lymphs Abs: 1.8 10*3/uL (ref 0.7–4.0)
MCH: 31.6 pg (ref 26.0–34.0)
MCHC: 33.8 g/dL (ref 30.0–36.0)
MCV: 93.4 fL (ref 78.0–100.0)
MONOS PCT: 10 % (ref 3–12)
MPV: 10.2 fL (ref 8.6–12.4)
Monocytes Absolute: 0.5 10*3/uL (ref 0.1–1.0)
NEUTROS ABS: 2.4 10*3/uL (ref 1.7–7.7)
Neutrophils Relative %: 50 % (ref 43–77)
Platelets: 136 10*3/uL — ABNORMAL LOW (ref 150–400)
RBC: 4.4 MIL/uL (ref 3.87–5.11)
RDW: 13.2 % (ref 11.5–15.5)
WBC: 4.8 10*3/uL (ref 4.0–10.5)

## 2015-07-26 MED ORDER — FLUCONAZOLE 150 MG PO TABS: 150.0000 mg | ORAL_TABLET | Freq: Once | ORAL | Status: DC

## 2015-07-26 MED ORDER — ZOLPIDEM TARTRATE 10 MG PO TABS: 10.0000 mg | ORAL_TABLET | Freq: Every evening | ORAL | Status: DC | PRN

## 2015-07-26 MED ORDER — ALPRAZOLAM 0.25 MG PO TABS: 0.2500 mg | ORAL_TABLET | Freq: Every evening | ORAL | Status: DC | PRN

## 2015-07-26 NOTE — Patient Instructions (Signed)
Health Recommendations for Postmenopausal Women Respected and ongoing research has looked at the most common causes of death, disability, and poor quality of life in postmenopausal women. The causes include heart disease, diseases of blood vessels, diabetes, depression, cancer, and bone loss (osteoporosis). Many things can be done to help lower the chances of developing these and other common problems. CARDIOVASCULAR DISEASE Heart Disease: A heart attack is a medical emergency. Know the signs and symptoms of a heart attack. Below are things women can do to reduce their risk for heart disease.   Do not smoke. If you smoke, quit.  Aim for a healthy weight. Being overweight causes many preventable deaths. Eat a healthy and balanced diet and drink an adequate amount of liquids.  Get moving. Make a commitment to be more physically active. Aim for 30 minutes of activity on most, if not all days of the week.  Eat for heart health. Choose a diet that is low in saturated fat and cholesterol and eliminate trans fat. Include whole grains, vegetables, and fruits. Read and understand the labels on food containers before buying.  Know your numbers. Ask your caregiver to check your blood pressure, cholesterol (total, HDL, LDL, triglycerides) and blood glucose. Work with your caregiver on improving your entire clinical picture.  High blood pressure. Limit or stop your table salt intake (try salt substitute and food seasonings). Avoid salty foods and drinks. Read labels on food containers before buying. Eating well and exercising can help control high blood pressure. STROKE  Stroke is a medical emergency. Stroke may be the result of a blood clot in a blood vessel in the brain or by a brain hemorrhage (bleeding). Know the signs and symptoms of a stroke. To lower the risk of developing a stroke:  Avoid fatty foods.  Quit smoking.  Control your diabetes, blood pressure, and irregular heart rate. THROMBOPHLEBITIS  (BLOOD CLOT) OF THE LEG  Becoming overweight and leading a stationary lifestyle may also contribute to developing blood clots. Controlling your diet and exercising will help lower the risk of developing blood clots. CANCER SCREENING  Breast Cancer: Take steps to reduce your risk of breast cancer.  You should practice "breast self-awareness." This means understanding the normal appearance and feel of your breasts and should include breast self-examination. Any changes detected, no matter how small, should be reported to your caregiver.  After age 40, you should have a clinical breast exam (CBE) every year.  Starting at age 40, you should consider having a mammogram (breast X-ray) every year.  If you have a family history of breast cancer, talk to your caregiver about genetic screening.  If you are at high risk for breast cancer, talk to your caregiver about having an MRI and a mammogram every year.  Intestinal or Stomach Cancer: Tests to consider are a rectal exam, fecal occult blood, sigmoidoscopy, and colonoscopy. Women who are high risk may need to be screened at an earlier age and more often.  Cervical Cancer:  Beginning at age 30, you should have a Pap test every 3 years as long as the past 3 Pap tests have been normal.  If you have had past treatment for cervical cancer or a condition that could lead to cancer, you need Pap tests and screening for cancer for at least 20 years after your treatment.  If you had a hysterectomy for a problem that was not cancer or a condition that could lead to cancer, then you no longer need Pap tests.    If you are between ages 65 and 70, and you have had normal Pap tests going back 10 years, you no longer need Pap tests.  If Pap tests have been discontinued, risk factors (such as a new sexual partner) need to be reassessed to determine if screening should be resumed.  Some medical problems can increase the chance of getting cervical cancer. In these  cases, your caregiver may recommend more frequent screening and Pap tests.  Uterine Cancer: If you have vaginal bleeding after reaching menopause, you should notify your caregiver.  Ovarian Cancer: Other than yearly pelvic exams, there are no reliable tests available to screen for ovarian cancer at this time except for yearly pelvic exams.  Lung Cancer: Yearly chest X-rays can detect lung cancer and should be done on high risk women, such as cigarette smokers and women with chronic lung disease (emphysema).  Skin Cancer: A complete body skin exam should be done at your yearly examination. Avoid overexposure to the sun and ultraviolet light lamps. Use a strong sun block cream when in the sun. All of these things are important for lowering the risk of skin cancer. MENOPAUSE Menopause Symptoms: Hormone therapy products are effective for treating symptoms associated with menopause:  Moderate to severe hot flashes.  Night sweats.  Mood swings.  Headaches.  Tiredness.  Loss of sex drive.  Insomnia.  Other symptoms. Hormone replacement carries certain risks, especially in older women. Women who use or are thinking about using estrogen or estrogen with progestin treatments should discuss that with their caregiver. Your caregiver will help you understand the benefits and risks. The ideal dose of hormone replacement therapy is not known. The Food and Drug Administration (FDA) has concluded that hormone therapy should be used only at the lowest doses and for the shortest amount of time to reach treatment goals.  OSTEOPOROSIS Protecting Against Bone Loss and Preventing Fracture If you use hormone therapy for prevention of bone loss (osteoporosis), the risks for bone loss must outweigh the risk of the therapy. Ask your caregiver about other medications known to be safe and effective for preventing bone loss and fractures. To guard against bone loss or fractures, the following is recommended:  If  you are younger than age 50, take 1000 mg of calcium and at least 600 mg of Vitamin D per day.  If you are older than age 50 but younger than age 70, take 1200 mg of calcium and at least 600 mg of Vitamin D per day.  If you are older than age 70, take 1200 mg of calcium and at least 800 mg of Vitamin D per day. Smoking and excessive alcohol intake increases the risk of osteoporosis. Eat foods rich in calcium and vitamin D and do weight bearing exercises several times a week as your caregiver suggests. DIABETES Diabetes Mellitus: If you have type I or type 2 diabetes, you should keep your blood sugar under control with diet, exercise, and recommended medication. Avoid starchy and fatty foods, and too many sweets. Being overweight can make diabetes control more difficult. COGNITION AND MEMORY Cognition and Memory: Menopausal hormone therapy is not recommended for the prevention of cognitive disorders such as Alzheimer's disease or memory loss.  DEPRESSION  Depression may occur at any age, but it is common in elderly women. This may be because of physical, medical, social (loneliness), or financial problems and needs. If you are experiencing depression because of medical problems and control of symptoms, talk to your caregiver about this. Physical   activity and exercise may help with mood and sleep. Community and volunteer involvement may improve your sense of value and worth. If you have depression and you feel that the problem is getting worse or becoming severe, talk to your caregiver about which treatment options are best for you. ACCIDENTS  Accidents are common and can be serious in elderly woman. Prepare your house to prevent accidents. Eliminate throw rugs, place hand bars in bath, shower, and toilet areas. Avoid wearing high heeled shoes or walking on wet, snowy, and icy areas. Limit or stop driving if you have vision or hearing problems, or if you feel you are unsteady with your movements and  reflexes. HEPATITIS C Hepatitis C is a type of viral infection affecting the liver. It is spread mainly through contact with blood from an infected person. It can be treated, but if left untreated, it can lead to severe liver damage over the years. Many people who are infected do not know that the virus is in their blood. If you are a "baby-boomer", it is recommended that you have one screening test for Hepatitis C. IMMUNIZATIONS  Several immunizations are important to consider having during your senior years, including:   Tetanus, diphtheria, and pertussis booster shot.  Influenza every year before the flu season begins.  Pneumonia vaccine.  Shingles vaccine.  Others, as indicated based on your specific needs. Talk to your caregiver about these. Document Released: 12/19/2005 Document Revised: 03/13/2014 Document Reviewed: 08/14/2008 ExitCare Patient Information 2015 [employer]. This information is not intended to replace advice given to you by your health care provider. Make sure you discuss any questions you have with your health care provider.  Insomnia Insomnia is frequent trouble falling and/or staying asleep. Insomnia can be a long term problem or a short term problem. Both are common. Insomnia can be a short term problem when the wakefulness is related to a certain stress or worry. Long term insomnia is often related to ongoing stress during waking hours and/or poor sleeping habits. Overtime, sleep deprivation itself can make the problem worse. Every little thing feels more severe because you are overtired and your ability to cope is decreased. CAUSES   Stress, anxiety, and depression.  Poor sleeping habits.  Distractions such as TV in the bedroom.  Naps close to bedtime.  Engaging in emotionally charged conversations before bed.  Technical reading before sleep.  Alcohol and other sedatives. They may make the problem worse. They can hurt normal sleep patterns and normal  dream activity.  Stimulants such as caffeine for several hours prior to bedtime.  Pain syndromes and shortness of breath can cause insomnia.  Exercise late at night.  Changing time zones may cause sleeping problems (jet lag). It is sometimes helpful to have someone observe your sleeping patterns. They should look for periods of not breathing during the night (sleep apnea). They should also look to see how long those periods last. If you live alone or observers are uncertain, you can also be observed at a sleep clinic where your sleep patterns will be professionally monitored. Sleep apnea requires a checkup and treatment. Give your caregivers your medical history. Give your caregivers observations your family has made about your sleep.  SYMPTOMS   Not feeling rested in the morning.  Anxiety and restlessness at bedtime.  Difficulty falling and staying asleep. TREATMENT   Your caregiver may prescribe treatment for an underlying medical disorders. Your caregiver can give advice or help if you are using alcohol or other   drugs for self-medication. Treatment of underlying problems will usually eliminate insomnia problems.  Medications can be prescribed for short time use. They are generally not recommended for lengthy use.  Over-the-counter sleep medicines are not recommended for lengthy use. They can be habit forming.  You can promote easier sleeping by making lifestyle changes such as:  Using relaxation techniques that help with breathing and reduce muscle tension.  Exercising earlier in the day.  Changing your diet and the time of your last meal. No night time snacks.  Establish a regular time to go to bed.  Counseling can help with stressful problems and worry.  Soothing music and white noise may be helpful if there are background noises you cannot remove.  Stop tedious detailed work at least one hour before bedtime. HOME CARE INSTRUCTIONS   Keep a diary. Inform your caregiver  about your progress. This includes any medication side effects. See your caregiver regularly. Take note of:  Times when you are asleep.  Times when you are awake during the night.  The quality of your sleep.  How you feel the next day. This information will help your caregiver care for you.  Get out of bed if you are still awake after 15 minutes. Read or do some quiet activity. Keep the lights down. Wait until you feel sleepy and go back to bed.  Keep regular sleeping and waking hours. Avoid naps.  Exercise regularly.  Avoid distractions at bedtime. Distractions include watching television or engaging in any intense or detailed activity like attempting to balance the household checkbook.  Develop a bedtime ritual. Keep a familiar routine of bathing, brushing your teeth, climbing into bed at the same time each night, listening to soothing music. Routines increase the success of falling to sleep faster.  Use relaxation techniques. This can be using breathing and muscle tension release routines. It can also include visualizing peaceful scenes. You can also help control troubling or intruding thoughts by keeping your mind occupied with boring or repetitive thoughts like the old concept of counting sheep. You can make it more creative like imagining planting one beautiful flower after another in your backyard garden.  During your day, work to eliminate stress. When this is not possible use some of the previous suggestions to help reduce the anxiety that accompanies stressful situations. MAKE SURE YOU:   Understand these instructions.  Will watch your condition.  Will get help right away if you are not doing well or get worse. Document Released: 10/24/2000 Document Revised: 01/19/2012 Document Reviewed: 11/24/2007 ExitCare Patient Information 2015 [employer]. This information is not intended to replace advice given to you by your health care provider. Make sure you discuss any  questions you have with your health care provider.  

## 2015-07-26 NOTE — Progress Notes (Signed)
Darlene Spencer [birth year] [id number]    History:    Presents for annual exam.  1988 TVH for endometriosis, 2007 BSO for endometrioma. No HRT. Normal Pap and mammogram history. 2014 T score -1.1 hip average, FRAX 5%/0.4%. Vitamin D 42. Has been on Lipitor since 2014 per cardiologist for hypercholesterolemia. 2008 negative colonoscopy.  Past medical history, past surgical history, family history and social history were all reviewed and documented in the EPIC chart. 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 Mother hypertension, father heart disease. Active lifestyle,  ROS:  A ROS was performed and pertinent positives and negatives are included.  Exam:  Filed Vitals:   07/26/15 0806  BP: 118/80    General appearance:  Normal Thyroid:  Symmetrical, normal in size, without palpable masses or nodularity. Respiratory  Auscultation:  Clear without wheezing or rhonchi Cardiovascular  Auscultation:  Regular rate, without rubs, murmurs or gallops  Edema/varicosities:  Not grossly evident Abdominal  Soft,nontender, without masses, guarding or rebound.  Liver/spleen:  No organomegaly noted  Hernia:  None appreciated  Skin  Inspection:  Grossly normal   Breasts: Examined lying and sitting.     Right: Without masses, retractions, discharge or axillary adenopathy.     Left: Without masses, retractions, discharge or axillary adenopathy. Gentitourinary   Inguinal/mons:  Normal without inguinal adenopathy  External genitalia:  Normal  BUS/Urethra/Skene's glands:  Normal  Vagina:  Normal  Cervix: And uterus absent  Adnexa/parametria:     Rt: Without masses or tenderness.   Lt: Without masses or tenderness.  Anus and perineum: Normal  Digital rectal exam: Normal sphincter tone without palpated masses or tenderness  Assessment/Plan:  54 y.o. MWF G2 P2 for annual exam with complaint of insomnia.  TVH with BSO on no HRT Normal DEXA Hypercholesterolemia meds and labs per  cardiologist Slightly Low WBCs/platelets  Plan: Reviewed sleep hygiene, Ambien 10 mg by mouth at bedtime when necessary reviewed not to use daily, Xanax 0.25 by mouth at bedtime when necessary for sleep aware to use sparingly and not with Ambien. Instructed to call if no relief. SBE's, continue annual 3-D mammogram history of dense breast. Continue active lifestyle, exercise, calcium rich diet, vitamin D 2000 daily encouraged. Will repeat CBC. UAHuel Cote [facility], 9:40 AM 07/26/2015  W

## 2015-07-27 LAB — URINALYSIS W MICROSCOPIC + REFLEX CULTURE
Bacteria, UA: NONE SEEN [HPF]
Bilirubin Urine: NEGATIVE
Casts: NONE SEEN [LPF]
Crystals: NONE SEEN [HPF]
Glucose, UA: NEGATIVE
Hgb urine dipstick: NEGATIVE
Ketones, ur: NEGATIVE
Nitrite: NEGATIVE
Protein, ur: NEGATIVE
RBC / HPF: NONE SEEN RBC/HPF
Specific Gravity, Urine: 1.019 (ref 1.001–1.035)
Yeast: NONE SEEN [HPF]
pH: 6.5 (ref 5.0–8.0)

## 2015-07-29 LAB — URINE CULTURE: Colony Count: >=100000

## 2015-07-30 ENCOUNTER — Other Ambulatory Visit: Payer: Self-pay | Admitting: Women's Health

## 2015-07-30 MED ORDER — SULFAMETHOXAZOLE-TRIMETHOPRIM 800-160 MG PO TABS: 1.0000 | ORAL_TABLET | Freq: Two times a day (BID) | ORAL | Status: DC

## 2015-09-25 ENCOUNTER — Telehealth: Payer: Self-pay | Admitting: *Deleted

## 2015-09-25 NOTE — Telephone Encounter (Signed)
Pt called wanted to know if she still had ovaries, I informed pt per note she had TVH with BSO.

## 2015-10-10 ENCOUNTER — Other Ambulatory Visit: Payer: Self-pay | Admitting: Family Medicine

## 2015-10-10 DIAGNOSIS — D696 Thrombocytopenia, unspecified: Secondary | ICD-10-CM

## 2015-10-10 DIAGNOSIS — E7800 Pure hypercholesterolemia, unspecified: Secondary | ICD-10-CM

## 2015-10-15 ENCOUNTER — Other Ambulatory Visit: Payer: BLUE CROSS/BLUE SHIELD

## 2015-11-15 ENCOUNTER — Encounter: Payer: Self-pay | Admitting: Gynecology

## 2015-11-15 ENCOUNTER — Ambulatory Visit (INDEPENDENT_AMBULATORY_CARE_PROVIDER_SITE_OTHER): Payer: BLUE CROSS/BLUE SHIELD | Admitting: Gynecology

## 2015-11-15 VITALS — BP 118/76

## 2015-11-15 DIAGNOSIS — N3001 Acute cystitis with hematuria: Secondary | ICD-10-CM | POA: Diagnosis not present

## 2015-11-15 DIAGNOSIS — R300 Dysuria: Secondary | ICD-10-CM

## 2015-11-15 LAB — URINALYSIS W MICROSCOPIC + REFLEX CULTURE
BILIRUBIN URINE: NEGATIVE
CRYSTALS: NONE SEEN [HPF]
Casts: NONE SEEN [LPF]
GLUCOSE, UA: NEGATIVE
Ketones, ur: NEGATIVE
Nitrite: NEGATIVE
Specific Gravity, Urine: >1.03 (ref 1.001–1.035)
Yeast: NONE SEEN [HPF]
pH: 6.5 (ref 5.0–8.0)

## 2015-11-15 MED ORDER — SULFAMETHOXAZOLE-TRIMETHOPRIM 800-160 MG PO TABS: 1.0000 | ORAL_TABLET | Freq: Two times a day (BID) | ORAL | Status: DC

## 2015-11-15 NOTE — Progress Notes (Signed)
ZNYA ELK 1961-01-01 [id number]        55 y.o.  [id number] Presents with less than 24-hour history of frequency, dysuria and urgency. No fever or chills. Noticed blood in her urine this morning.  Past medical history,surgical history, problem list, medications, allergies, family history and social history were all reviewed and documented in the EPIC chart.  Directed ROS with pertinent positives and negatives documented in the history of present illness/assessment and plan.  Exam: Filed Vitals:   11/15/15 0950  BP: 118/76   General appearance:  Normal Spine straight without CVA tenderness Abdomen soft nontender without masses guarding rebound  Assessment/Plan:  55 y.o. [id number] with history and urinalysis consistent with cystitis. Will treat with Septra DS 1 by mouth twice a day 3 days and over-the-counter Urispas as needed. Follow up if symptoms persist, worsen or recur.    Anastasio Auerbach MD, 10:06 AM 11/15/2015

## 2015-11-15 NOTE — Patient Instructions (Signed)
Start the Septra antibiotic twice daily for 3 days. Follow up if your symptoms persist worsen or recur.  You can take the over the counter Urispas or equivalent which is a medication that will numb the bladder until the antibiotics take effect. Ask the pharmacist when you pick up your antibiotics and they can direct you to this.      Urinary Tract Infection Urinary tract infections (UTIs) can develop anywhere along your urinary tract. Your urinary tract is your body's drainage system for removing wastes and extra water. Your urinary tract includes two kidneys, two ureters, a bladder, and a urethra. Your kidneys are a pair of bean-shaped organs. Each kidney is about the size of your fist. They are located below your ribs, one on each side of your spine. CAUSES Infections are caused by microbes, which are microscopic organisms, including fungi, viruses, and bacteria. These organisms are so small that they can only be seen through a microscope. Bacteria are the microbes that most commonly cause UTIs. SYMPTOMS  Symptoms of UTIs may vary by age and gender of the patient and by the location of the infection. Symptoms in young women typically include a frequent and intense urge to urinate and a painful, burning feeling in the bladder or urethra during urination. Older women and men are more likely to be tired, shaky, and weak and have muscle aches and abdominal pain. A fever may mean the infection is in your kidneys. Other symptoms of a kidney infection include pain in your back or sides below the ribs, nausea, and vomiting. DIAGNOSIS To diagnose a UTI, your caregiver will ask you about your symptoms. Your caregiver will also ask you to provide a urine sample. The urine sample will be tested for bacteria and white blood cells. White blood cells are made by your body to help fight infection. TREATMENT  Typically, UTIs can be treated with medication. Because most UTIs are caused by a bacterial infection,  they usually can be treated with the use of antibiotics. The choice of antibiotic and length of treatment depend on your symptoms and the type of bacteria causing your infection. HOME CARE INSTRUCTIONS  If you were prescribed antibiotics, take them exactly as your caregiver instructs you. Finish the medication even if you feel better after you have only taken some of the medication.  Drink enough water and fluids to keep your urine clear or pale yellow.  Avoid caffeine, tea, and carbonated beverages. They tend to irritate your bladder.  Empty your bladder often. Avoid holding urine for long periods of time.  Empty your bladder before and after sexual intercourse.  After a bowel movement, women should cleanse from front to back. Use each tissue only once. SEEK MEDICAL CARE IF:   You have back pain.  You develop a fever.  Your symptoms do not begin to resolve within 3 days. SEEK IMMEDIATE MEDICAL CARE IF:   You have severe back pain or lower abdominal pain.  You develop chills.  You have nausea or vomiting.  You have continued burning or discomfort with urination. MAKE SURE YOU:   Understand these instructions.  Will watch your condition.  Will get help right away if you are not doing well or get worse.   This information is not intended to replace advice given to you by your health care provider. Make sure you discuss any questions you have with your health care provider.   Document Released: 08/06/2005 Document Revised: 07/18/2015 Document Reviewed: 12/05/2011 Elsevier Interactive Patient Education  2016 [employer].

## 2015-11-16 LAB — URINE CULTURE: Colony Count: 2000

## 2015-11-19 ENCOUNTER — Other Ambulatory Visit: Payer: Self-pay | Admitting: Gynecology

## 2015-11-19 DIAGNOSIS — R82998 Other abnormal findings in urine: Secondary | ICD-10-CM

## 2015-11-19 DIAGNOSIS — R3129 Other microscopic hematuria: Secondary | ICD-10-CM

## 2015-11-23 ENCOUNTER — Encounter: Payer: Self-pay | Admitting: Family Medicine

## 2015-11-23 ENCOUNTER — Ambulatory Visit (INDEPENDENT_AMBULATORY_CARE_PROVIDER_SITE_OTHER): Payer: BLUE CROSS/BLUE SHIELD | Admitting: Family Medicine

## 2015-11-23 VITALS — BP 111/75 | HR 79 | Temp 98.4°F | Ht 63.0 in | Wt 118.5 lb

## 2015-11-23 DIAGNOSIS — J0100 Acute maxillary sinusitis, unspecified: Secondary | ICD-10-CM | POA: Diagnosis not present

## 2015-11-23 MED ORDER — AMOXICILLIN 500 MG PO CAPS: 1000.0000 mg | ORAL_CAPSULE | Freq: Two times a day (BID) | ORAL | Status: DC

## 2015-11-23 NOTE — Progress Notes (Signed)
   Subjective:    Patient ID: Darlene Spencer, female    DOB: Aug 17, 1961, 55 y.o.   MRN: [id number]  Fever  This is a new problem. The problem occurs intermittently. The problem has been waxing and waning. Her temperature was unmeasured prior to arrival. Associated symptoms include congestion, headaches and a sore throat. Pertinent negatives include no coughing or ear pain. She has tried NSAIDs for the symptoms. The treatment provided mild relief.  Sinusitis This is a new problem. The current episode started 1 to 4 weeks ago. The pain is moderate. Associated symptoms include congestion, headaches, sinus pressure, sneezing and a sore throat. Pertinent negatives include no coughing or ear pain. ( fatigue) Treatments tried: alkaseltzer, claritin. The treatment provided mild relief.    Social History /Family History/Past Medical History reviewed and updated if needed. No history of sinus issues. Had sulfa for UTI in last 2 weeks.   Review of Systems  Constitutional: Positive for fever.  HENT: Positive for congestion, sinus pressure, sneezing and sore throat. Negative for ear pain.   Respiratory: Negative for cough.   Neurological: Positive for headaches.       Objective:   Physical Exam  Constitutional: Vital signs are normal. She appears well-developed and well-nourished. She is cooperative.  Non-toxic appearance. She does not appear ill. No distress.  HENT:  Head: Normocephalic.  Right Ear: Hearing, tympanic membrane, external ear and ear canal normal. Tympanic membrane is not erythematous, not retracted and not bulging. No middle ear effusion.  Left Ear: Hearing, tympanic membrane, external ear and ear canal normal. Tympanic membrane is not erythematous, not retracted and not bulging.  No middle ear effusion.  Nose: Mucosal edema and rhinorrhea present. Right sinus exhibits maxillary sinus tenderness. Right sinus exhibits no frontal sinus tenderness. Left sinus exhibits maxillary sinus  tenderness. Left sinus exhibits no frontal sinus tenderness.  Mouth/Throat: Uvula is midline, oropharynx is clear and moist and mucous membranes are normal.  Eyes: Conjunctivae, EOM and lids are normal. Pupils are equal, round, and reactive to light. Lids are everted and swept, no foreign bodies found.  Neck: Trachea normal and normal range of motion. Neck supple. Carotid bruit is not present. No thyroid mass and no thyromegaly present.  Cardiovascular: Normal rate, regular rhythm, S1 normal, S2 normal, normal heart sounds, intact distal pulses and normal pulses.  Exam reveals no gallop and no friction rub.   No murmur heard. Pulmonary/Chest: Effort normal and breath sounds normal. No tachypnea. No respiratory distress. She has no decreased breath sounds. She has no wheezes. She has no rhonchi. She has no rales.  Neurological: She is alert.  Skin: Skin is warm, dry and intact. No rash noted.  Psychiatric: Her speech is normal and behavior is normal. Judgment normal. Her mood appears not anxious. Cognition and memory are normal. She does not exhibit a depressed mood.          Assessment & Plan:

## 2015-11-23 NOTE — Patient Instructions (Signed)
Complete 10 days course of amox. Nasal saline spray or irrigation 2 times daily. Mucinex DM twice daily. Call if not improving as expected 4-5 days.  Call sooner if fever on antibiotics or new shortness of breath.

## 2015-11-23 NOTE — Progress Notes (Signed)
Pre visit review using our clinic review tool, if applicable. No additional management support is needed unless otherwise documented below in the visit note. 

## 2015-12-05 ENCOUNTER — Other Ambulatory Visit: Payer: Self-pay

## 2015-12-05 DIAGNOSIS — Z1231 Encounter for screening mammogram for malignant neoplasm of breast: Secondary | ICD-10-CM

## 2016-01-07 ENCOUNTER — Ambulatory Visit
Admission: RE | Admit: 2016-01-07 | Discharge: 2016-01-07 | Disposition: A | Discharge status: Home / Self Care | Payer: BLUE CROSS/BLUE SHIELD | Source: Ambulatory Visit

## 2016-01-07 DIAGNOSIS — Z1231 Encounter for screening mammogram for malignant neoplasm of breast: Secondary | ICD-10-CM

## 2016-02-15 DIAGNOSIS — G43719 Chronic migraine without aura, intractable, without status migrainosus: Secondary | ICD-10-CM | POA: Diagnosis not present

## 2016-02-15 DIAGNOSIS — G43019 Migraine without aura, intractable, without status migrainosus: Secondary | ICD-10-CM | POA: Diagnosis not present

## 2016-03-02 ENCOUNTER — Other Ambulatory Visit: Payer: Self-pay | Admitting: Women's Health

## 2016-03-11 ENCOUNTER — Other Ambulatory Visit: Payer: Self-pay | Admitting: Women's Health

## 2016-03-11 NOTE — Telephone Encounter (Signed)
Rx called in 

## 2016-03-11 NOTE — Telephone Encounter (Signed)
Ok for refill? 

## 2016-03-11 NOTE — Telephone Encounter (Signed)
Last filled on 07/27/15

## 2016-04-18 ENCOUNTER — Other Ambulatory Visit: Payer: Self-pay | Admitting: Family Medicine

## 2016-04-18 NOTE — Telephone Encounter (Signed)
Spoke to pt and informed her OV required. States she will contact office back to schedule CPE. Rx filled for #30

## 2016-05-01 ENCOUNTER — Other Ambulatory Visit (INDEPENDENT_AMBULATORY_CARE_PROVIDER_SITE_OTHER): Payer: BLUE CROSS/BLUE SHIELD

## 2016-05-01 DIAGNOSIS — D696 Thrombocytopenia, unspecified: Secondary | ICD-10-CM | POA: Diagnosis not present

## 2016-05-01 DIAGNOSIS — E7800 Pure hypercholesterolemia, unspecified: Secondary | ICD-10-CM | POA: Diagnosis not present

## 2016-05-01 LAB — LIPID PANEL
Cholesterol: 172 mg/dL (ref 0–200)
HDL: 71.3 mg/dL (ref >39.00)
LDL CALC: 86 mg/dL (ref 0–99)
NONHDL: 100.72
Total CHOL/HDL Ratio: 2
Triglycerides: 75 mg/dL (ref 0.0–149.0)
VLDL: 15 mg/dL (ref 0.0–40.0)

## 2016-05-01 LAB — CBC WITH DIFFERENTIAL/PLATELET
BASOS PCT: 0.5 % (ref 0.0–3.0)
Basophils Absolute: 0 10*3/uL (ref 0.0–0.1)
EOS PCT: 2.7 % (ref 0.0–5.0)
Eosinophils Absolute: 0.1 10*3/uL (ref 0.0–0.7)
HEMATOCRIT: 40.6 % (ref 36.0–46.0)
HEMOGLOBIN: 13.7 g/dL (ref 12.0–15.0)
LYMPHS PCT: 36.5 % (ref 12.0–46.0)
Lymphs Abs: 1.3 10*3/uL (ref 0.7–4.0)
MCHC: 33.7 g/dL (ref 30.0–36.0)
MCV: 93.5 fl (ref 78.0–100.0)
Monocytes Absolute: 0.5 10*3/uL (ref 0.1–1.0)
Monocytes Relative: 13.9 % — ABNORMAL HIGH (ref 3.0–12.0)
Neutro Abs: 1.7 10*3/uL (ref 1.4–7.7)
Neutrophils Relative %: 46.4 % (ref 43.0–77.0)
Platelets: 126 10*3/uL — ABNORMAL LOW (ref 150.0–400.0)
RBC: 4.34 Mil/uL (ref 3.87–5.11)
RDW: 12.8 % (ref 11.5–15.5)
WBC: 3.7 10*3/uL — AB (ref 4.0–10.5)

## 2016-05-01 LAB — COMPREHENSIVE METABOLIC PANEL
ALBUMIN: 4.3 g/dL (ref 3.5–5.2)
ALK PHOS: 67 U/L (ref 39–117)
ALT: 12 U/L (ref 0–35)
AST: 16 U/L (ref 0–37)
BUN: 14 mg/dL (ref 6–23)
CALCIUM: 9.5 mg/dL (ref 8.4–10.5)
CHLORIDE: 109 meq/L (ref 96–112)
CO2: 29 mEq/L (ref 19–32)
CREATININE: 0.92 mg/dL (ref 0.40–1.20)
GFR: 67 mL/min (ref >60.00)
Glucose, Bld: 88 mg/dL (ref 70–99)
Potassium: 3.6 mEq/L (ref 3.5–5.1)
SODIUM: 138 meq/L (ref 135–145)
TOTAL PROTEIN: 7 g/dL (ref 6.0–8.3)
Total Bilirubin: 0.5 mg/dL (ref 0.2–1.2)

## 2016-05-01 LAB — TSH: TSH: 1.9 u[IU]/mL (ref 0.35–4.50)

## 2016-05-07 ENCOUNTER — Encounter: Payer: Self-pay | Admitting: Family Medicine

## 2016-05-07 ENCOUNTER — Ambulatory Visit (INDEPENDENT_AMBULATORY_CARE_PROVIDER_SITE_OTHER): Payer: BLUE CROSS/BLUE SHIELD | Admitting: Family Medicine

## 2016-05-07 VITALS — BP 100/64 | HR 73 | Temp 97.7°F | Ht 63.75 in | Wt 118.5 lb

## 2016-05-07 DIAGNOSIS — E7800 Pure hypercholesterolemia, unspecified: Secondary | ICD-10-CM

## 2016-05-07 DIAGNOSIS — Z0000 Encounter for general adult medical examination without abnormal findings: Secondary | ICD-10-CM | POA: Diagnosis not present

## 2016-05-07 DIAGNOSIS — G43001 Migraine without aura, not intractable, with status migrainosus: Secondary | ICD-10-CM

## 2016-05-07 MED ORDER — ATORVASTATIN CALCIUM 10 MG PO TABS: ORAL_TABLET | ORAL | Status: DC

## 2016-05-07 NOTE — Assessment & Plan Note (Signed)
Well controlled on current dose of lipitor. eRx refilled.

## 2016-05-07 NOTE — Progress Notes (Signed)
Subjective:   Patient ID: Darlene Spencer, female    DOB: 1960-11-18, 55 y.o.   MRN: [id number]  Darlene Spencer is a pleasant 55 y.o. year old female who presents to clinic today with Annual Exam  and follow up of chronic medical conditions on 05/07/2016  HPI: Colonoscopy  8/26/08Carlean Purl- 10 year recall Mammogram 01/07/16 Remote h/o hysterectomy, had BME done by GYN Elon Alas) on 07/26/15   HLD- taking lipitor.Denies myalgias.  Lab Results  Component Value Date   CHOL 172 05/01/2016   HDL 71.30 05/01/2016   LDLCALC 86 05/01/2016   LDLDIRECT 139.1 05/09/2013   TRIG 75.0 05/01/2016   CHOLHDL 2 05/01/2016   Migraines- rarely gets migraines now that she is taking Topamax 100 mg daily.  Insomnia- very rarely uses ambien- on average 3 times per month.  Lab Results  Component Value Date   HGBA1C 5.3 03/30/2015   Lab Results  Component Value Date   TSH 1.90 05/01/2016      Lab Results  Component Value Date   WBC 3.7* 05/01/2016   HGB 13.7 05/01/2016   HCT 40.6 05/01/2016   MCV 93.5 05/01/2016   PLT 126.0* 05/01/2016   Lab Results  Component Value Date   NA 138 05/01/2016   K 3.6 05/01/2016   CL 109 05/01/2016   CO2 29 05/01/2016   Lab Results  Component Value Date   CREATININE 0.92 05/01/2016   Lab Results  Component Value Date   CHOL 172 05/01/2016   HDL 71.30 05/01/2016   LDLCALC 86 05/01/2016   LDLDIRECT 139.1 05/09/2013   TRIG 75.0 05/01/2016   CHOLHDL 2 05/01/2016   Lab Results  Component Value Date   VITAMINB12 449 03/30/2015   Current Outpatient Prescriptions on File Prior to Visit  Medication Sig Dispense Refill  . atorvastatin (LIPITOR) 10 MG tablet TAKE 1 TABLET(10 MG) BY MOUTH DAILY 30 tablet 0  . Calcium Carbonate-Vitamin D (TH CALCIUM CARBONATE-VITAMIN D) 600-400 MG-UNIT per tablet Take 1 tablet by mouth daily at 6 PM.     . fish oil-omega-3 fatty acids 1000 MG capsule Take 1 g by mouth daily.      . Multiple Vitamin (MULTIVITAMIN)  tablet Take 1 tablet by mouth daily.      Marland Kitchen topiramate (TOPAMAX) 100 MG tablet Take 100 mg by mouth daily at 6 PM.    . zolpidem (AMBIEN) 10 MG tablet TAKE 1 TABLET BY MOUTH EVERY DAY AT BEDTIME AS NEEDED SLEEP 30 tablet 0   No current facility-administered medications on file prior to visit.    No Known Allergies  Past Medical History  Diagnosis Date  . Endometriosis 2007    endometriotc cyst Rt.ovary  . Headache(784.0)   . Osteopenia 08/2013    T score -1.1 FRAX 5.0%/0.4%    Past Surgical History  Procedure Laterality Date  . Vaginal hysterectomy  1998  . Oophorectomy  2007    BSO  . Pelvic laparoscopy  2007    BSO    Family History  Problem Relation Age of Onset  . Hypertension Mother   . Heart disease Father   . Heart failure Father   . Heart attack Father 67  . Cancer Maternal Grandfather     colon  . Hypertension Maternal Grandfather     Social History   Social History  . Marital Status: Married    Spouse Name: N/A  . Number of Children: 2  . Years of Education: N/A   Occupational  History  .     Social History Main Topics  . Smoking status: Never Smoker   . Smokeless tobacco: Never Used  . Alcohol Use: No     Comment: Rare  . Drug Use: No  . Sexual Activity: Yes    Birth Control/ Protection: Surgical   Other Topics Concern  . Not on file   Social History Narrative   The PMH, PSH, Social History, Family History, Medications, and allergies have been reviewed in [facility], and have been updated if relevant.   Review of Systems  Constitutional: Negative.   HENT: Negative.   Eyes: Negative.   Respiratory: Negative.   Cardiovascular: Negative.   Gastrointestinal: Negative.   Endocrine: Negative.   Genitourinary: Negative.   Musculoskeletal: Negative.   Skin: Negative.   Allergic/Immunologic: Negative.   Neurological: Negative.   Hematological: Negative.   Psychiatric/Behavioral: Negative.        Objective:    BP 100/64 mmHg  Pulse 73   Temp(Src) 97.7 F (36.5 C) (Oral)  Ht 5' 3.75" (1.619 m)  Wt 118 lb 8 oz (53.751 kg)  BMI 20.51 kg/m2  SpO2 97%  LMP 11/10/1996   Physical Exam  Constitutional: She is oriented to person, place, and time. She appears well-developed and well-nourished. No distress.  HENT:  Head: Normocephalic and atraumatic.  Eyes: Conjunctivae are normal.  Neck: Normal range of motion.  Cardiovascular: Normal rate and regular rhythm.   Pulmonary/Chest: Effort normal and breath sounds normal.  Abdominal: Soft.  Musculoskeletal: Normal range of motion. She exhibits no edema.  Neurological: She is alert and oriented to person, place, and time. No cranial nerve deficit.  Skin: Skin is warm.  Sun spots throughout  Psychiatric: She has a normal mood and affect. Her behavior is normal. Judgment and thought content normal.  Nursing note and vitals reviewed.         Assessment & Plan:   Well woman exam (no gynecological exam)  Migraine without aura and with status migrainosus, not intractable No Follow-up on file.

## 2016-05-07 NOTE — Patient Instructions (Addendum)
Good to see you.  Your blood pressure looks great.

## 2016-05-07 NOTE — Assessment & Plan Note (Signed)
Under better control with topamax.

## 2016-05-07 NOTE — Assessment & Plan Note (Signed)
Reviewed preventive care protocols, scheduled due services, and updated immunizations Discussed nutrition, exercise, diet, and healthy lifestyle.  

## 2016-05-28 ENCOUNTER — Telehealth: Payer: Self-pay | Admitting: Family Medicine

## 2016-05-28 DIAGNOSIS — D696 Thrombocytopenia, unspecified: Secondary | ICD-10-CM

## 2016-05-28 NOTE — Telephone Encounter (Signed)
Patient would like a referral to a doctor to handle a low white blood cell count and she's starting to bruise easily.

## 2016-05-28 NOTE — Telephone Encounter (Signed)
Referral placed.

## 2016-05-30 ENCOUNTER — Telehealth: Payer: Self-pay | Admitting: Family Medicine

## 2016-05-30 ENCOUNTER — Encounter: Payer: Self-pay | Admitting: Internal Medicine

## 2016-05-30 DIAGNOSIS — R159 Full incontinence of feces: Secondary | ICD-10-CM

## 2016-05-30 NOTE — Telephone Encounter (Signed)
Referral placed.

## 2016-05-30 NOTE — Telephone Encounter (Signed)
Patient called to ask for a  Referral to her GI Dr Carlean Purl for problems with losing her bowels. She isnt due for a Colonoscopy until 06/10/2017.

## 2016-06-20 ENCOUNTER — Telehealth: Payer: Self-pay | Admitting: Hematology and Oncology

## 2016-06-20 ENCOUNTER — Encounter: Payer: Self-pay | Admitting: Hematology and Oncology

## 2016-06-20 NOTE — Telephone Encounter (Signed)
Appointment scheduled with Gudena on 8/22 at 1pm. Patient agreed to the date and time. Letter mailed to the patient.

## 2016-07-01 ENCOUNTER — Encounter: Payer: Self-pay | Admitting: Hematology and Oncology

## 2016-07-01 ENCOUNTER — Telehealth: Payer: Self-pay | Admitting: Hematology and Oncology

## 2016-07-01 ENCOUNTER — Ambulatory Visit (HOSPITAL_BASED_OUTPATIENT_CLINIC_OR_DEPARTMENT_OTHER): Payer: BLUE CROSS/BLUE SHIELD | Admitting: Hematology and Oncology

## 2016-07-01 VITALS — BP 129/86 | HR 71 | Temp 98.5°F | Resp 17 | Wt 117.9 lb

## 2016-07-01 DIAGNOSIS — D696 Thrombocytopenia, unspecified: Secondary | ICD-10-CM | POA: Diagnosis not present

## 2016-07-01 DIAGNOSIS — D72819 Decreased white blood cell count, unspecified: Secondary | ICD-10-CM | POA: Diagnosis not present

## 2016-07-01 NOTE — Assessment & Plan Note (Signed)
Mild thrombocytopenia: Platelet count fluctuating between 126K and 150K for the past 6 years.  Patient has had mild bruising probably related to her very active lifestyle than anything else. She does not have frequent nosebleeds. Very occasional gum bleeds when she does excessive flossing. No longer has menstrual cycles. She has not had any problems with prior surgeries.  Differential diagnosis: 1. Decreased production: Low-grade MDS 2. Vs increased distruction: Low-grade ITP Previous 0000000 and folic acid levels were normal. I explained to the patient that invasive procedures like bone marrow biopsies are probably not necessary at these levels. Patient is also completely asymptomatic without any fatigue fevers chills night sweats or any other major complaints.

## 2016-07-01 NOTE — Telephone Encounter (Signed)
appt made and avs printed °

## 2016-07-01 NOTE — Progress Notes (Signed)
[facility] CONSULT NOTE  Patient Care Team: Dianne Dun, MD as PCP - General (Family Medicine)  CHIEF COMPLAINTS/PURPOSE OF CONSULTATION:  Mild thrombocytopenia and mild leukopenia  HISTORY OF PRESENTING ILLNESS:  Darlene Spencer 55 y.o. female is here because of a diagnosis of mild thrombocytopenia and leukopenia. Patient has had fluctuating mild normocytic pain at least for the past 6 years. Her platelet counts have ranged from 110K to 150K. she has not had any excessive bruising or bleeding. She is a very active lifestyle and tends to get occasional bruises. She denies any problems with nosebleeds. She has occasional gum bleeds if she flosses very hard. She has not had any problems previous surgeries with excessive bleeding. There has not been any new medications. She has not been ill or sick with anything. She was also noted to have mild leukopenia with relative increase in monocyte count. She has not had any fevers chills infections area issues. Upon review of her previous blood work it appears that she has had intermittent mild leukopenia or the past several years. It is primarily neutrophils count related. There was also been slight lymphopenia. These blood counts have not been severe enough to be characterized as neutropenia or lymphopenia.  I reviewed her records extensively and collaborated the history with the patient.  MEDICAL HISTORY:  Past Medical History:  Diagnosis Date  . Endometriosis 2007   endometriotc cyst Rt.ovary  . Headache(784.0)   . Osteopenia 08/2013   T score -1.1 FRAX 5.0%/0.4%    SURGICAL HISTORY: Past Surgical History:  Procedure Laterality Date  . OOPHORECTOMY  2007   BSO  . PELVIC LAPAROSCOPY  2007   BSO  . VAGINAL HYSTERECTOMY  1998    SOCIAL HISTORY: Social History   Social History  . Marital status: Married    Spouse name: N/A  . Number of children: 2  . Years of education: N/A   Occupational History  .  [employer]   Social History Main Topics  . Smoking status: Never Smoker  . Smokeless tobacco: Never Used  . Alcohol use No     Comment: Rare  . Drug use: No  . Sexual activity: Yes    Birth control/ protection: Surgical   Other Topics Concern  . Not on file   Social History Narrative  . No narrative on file    FAMILY HISTORY: Family History  Problem Relation Age of Onset  . Hypertension Mother   . Heart disease Father   . Heart failure Father   . Heart attack Father 19  . Cancer Maternal Grandfather     colon  . Hypertension Maternal Grandfather     ALLERGIES:  has No Known Allergies.  MEDICATIONS:  Current Outpatient Prescriptions  Medication Sig Dispense Refill  . atorvastatin (LIPITOR) 10 MG tablet TAKE 1 TABLET(10 MG) BY MOUTH DAILY 30 tablet 6  . Calcium Carbonate-Vitamin D (TH CALCIUM CARBONATE-VITAMIN D) 600-400 MG-UNIT per tablet Take 1 tablet by mouth daily at 6 PM.     . fish oil-omega-3 fatty acids 1000 MG capsule Take 1 g by mouth daily.      . Multiple Vitamin (MULTIVITAMIN) tablet Take 1 tablet by mouth daily.      Marland Kitchen topiramate (TOPAMAX) 100 MG tablet Take 100 mg by mouth daily at 6 PM.    . zolpidem (AMBIEN) 10 MG tablet TAKE 1 TABLET BY MOUTH EVERY DAY AT BEDTIME AS NEEDED SLEEP 30 tablet 0   No current facility-administered  medications for this visit.     REVIEW OF SYSTEMS:   Constitutional: Denies fevers, chills or abnormal night sweats Eyes: Denies blurriness of vision, double vision or watery eyes Ears, nose, mouth, throat, and face: Denies mucositis or sore throat Respiratory: Denies cough, dyspnea or wheezes Cardiovascular: Denies palpitation, chest discomfort or lower extremity swelling Gastrointestinal:  Denies nausea, heartburn or change in bowel habits Skin: Denies abnormal skin rashes Lymphatics: Denies new lymphadenopathy or easy bruising Neurological:Denies numbness, tingling or new weaknesses Behavioral/Psych: Mood  is stable, no new changes  Breast:  Denies any palpable lumps or discharge All other systems were reviewed with the patient and are negative.  PHYSICAL EXAMINATION: ECOG PERFORMANCE STATUS: 0 - Asymptomatic  Vitals:   07/01/16 1248  BP: 129/86  Pulse: 71  Resp: 17  Temp: 98.5 F (36.9 C)   Filed Weights   07/01/16 1248  Weight: 117 lb 14.4 oz (53.5 kg)    GENERAL:alert, no distress and comfortable SKIN: skin color, texture, turgor are normal, no rashes or significant lesions EYES: normal, conjunctiva are pink and non-injected, sclera clear OROPHARYNX:no exudate, no erythema and lips, buccal mucosa, and tongue normal  NECK: supple, thyroid normal size, non-tender, without nodularity LYMPH:  no palpable lymphadenopathy in the cervical, axillary or inguinal LUNGS: clear to auscultation and percussion with normal breathing effort HEART: regular rate & rhythm and no murmurs and no lower extremity edema ABDOMEN:abdomen soft, non-tender and normal bowel sounds Musculoskeletal:no cyanosis of digits and no clubbing  PSYCH: alert & oriented x 3 with fluent speech NEURO: no focal motor/sensory deficits  LABORATORY DATA:  I have reviewed the data as listed Lab Results  Component Value Date   WBC 3.7 (L) 05/01/2016   HGB 13.7 05/01/2016   HCT 40.6 05/01/2016   MCV 93.5 05/01/2016   PLT 126.0 (L) 05/01/2016   Lab Results  Component Value Date   NA 138 05/01/2016   K 3.6 05/01/2016   CL 109 05/01/2016   CO2 29 05/01/2016    RADIOGRAPHIC STUDIES: I have personally reviewed the radiological reports and agreed with the findings in the report.  ASSESSMENT AND PLAN:  Thrombocytopenia Mild thrombocytopenia: Platelet count fluctuating between 126K and 150K for the past 6 years.  Patient has had mild bruising probably related to her very active lifestyle than anything else. She does not have frequent nosebleeds. Very occasional gum bleeds when she does excessive flossing. No  longer has menstrual cycles. She has not had any problems with prior surgeries.  Differential diagnosis: 1. Decreased production: Low-grade MDS 2. Vs increased distruction: Low-grade ITP Previous J-47 and folic acid levels were normal. I explained to the patient that invasive procedures like bone marrow biopsies are probably not necessary at these levels. Patient is also completely asymptomatic without any fatigue fevers chills night sweats or any other major complaints.  Leukopenia Mild leukopenia: White blood cell count 3.7 on 05/01/2016 ANC 1700 ALC 1300 Monocyte count 500  Previously blood counts reviewed also suggested a fluctuating white blood cell count. The likely cause of mild leukopenia is probably the mild decrease in the neutrophil count. I discussed with the patient that the slight decrease in the neutrophil count that has been fluctuating for the past 6 years without any clear trend suggests cyclical neutropenia. There is no indication for treatment. I did not recommend a bone marrow biopsy at this time.  If however her blood counts get worse, we would be happy to proceed with additional diagnostic workup. I discussed the  pros and cons of obtaining a flow cytometry. Because of the low positive predictive value of flow cytometry, I did not recommend it.  Return to clinic in 1 year to review the blood work. If the patient decides to undergo flow cytometry, she will call us. Patient has a high deductible health plan and she is worried about the cost of the flow cytometry test. I do not think it is absolutely essential. However if she requested we would be happy to send for flow cytometry.  All questions were answered. The patient knows to call the clinic with any problems, questions or concerns.    Rulon Eisenmenger, MD 07/01/16

## 2016-07-01 NOTE — Assessment & Plan Note (Signed)
Mild leukopenia: White blood cell count 3.7 on 05/01/2016 ANC 1700 ALC 1300 Monocyte count 500  Previously blood counts reviewed also suggested a fluctuating white blood cell count. The likely cause of mild leukopenia is probably the mild decrease in the neutrophil count. I discussed with the patient that the slight decrease in the neutrophil count that has been fluctuating for the past 6 years without any clear trend suggests cyclical neutropenia. There is no indication for treatment. I did not recommend a bone marrow biopsy at this time.  If however her blood counts get worse, we would be happy to proceed with additional diagnostic workup. I discussed the pros and cons of obtaining a flow cytometry. Because of the low positive predictive value of flow cytometry, I did not recommend it.

## 2016-07-29 ENCOUNTER — Ambulatory Visit (INDEPENDENT_AMBULATORY_CARE_PROVIDER_SITE_OTHER): Payer: BLUE CROSS/BLUE SHIELD | Admitting: Women's Health

## 2016-07-29 ENCOUNTER — Encounter: Payer: Self-pay | Admitting: Women's Health

## 2016-07-29 VITALS — BP 117/82 | Ht 64.0 in | Wt 115.2 lb

## 2016-07-29 DIAGNOSIS — G4700 Insomnia, unspecified: Secondary | ICD-10-CM | POA: Diagnosis not present

## 2016-07-29 DIAGNOSIS — J0100 Acute maxillary sinusitis, unspecified: Secondary | ICD-10-CM

## 2016-07-29 DIAGNOSIS — Z01419 Encounter for gynecological examination (general) (routine) without abnormal findings: Secondary | ICD-10-CM | POA: Diagnosis not present

## 2016-07-29 MED ORDER — AMOXICILLIN 500 MG PO CAPS: 500.0000 mg | ORAL_CAPSULE | Freq: Three times a day (TID) | ORAL | 0 refills | Status: DC

## 2016-07-29 MED ORDER — FLUCONAZOLE 150 MG PO TABS: 150.0000 mg | ORAL_TABLET | Freq: Once | ORAL | 1 refills | Status: AC

## 2016-07-29 MED ORDER — ZOLPIDEM TARTRATE 10 MG PO TABS: ORAL_TABLET | ORAL | 0 refills | Status: DC

## 2016-07-29 NOTE — Progress Notes (Signed)
Darlene Spencer [birth year] [id number]    History:    Presents for annual exam.  TVH with BSO for endometriosis on no HRT. Normal Pap and mammogram history. 2008 negative colonoscopy. History of mild thrombocytopenia has seen a hematologist with no treatment needed. On Lipitor per cardiologist. 2014 T score -1.1 and hip average FRAX 5%/0.4%  Past medical history, past surgical history, family history and social history were all reviewed and documented in the EPIC chart.  ROS:  A ROS was performed and pertinent positives and negatives are included.  Exam:  Vitals:   07/29/16 0927  BP: 117/82  Weight: 115 lb 3.2 oz (52.3 kg)  Height: 5\' 4"  (1.626 m)   Body mass index is 19.77 kg/m.   General appearance:  Normal Thyroid:  Symmetrical, normal in size, without palpable masses or nodularity. Respiratory  Auscultation:  Clear without wheezing or rhonchi Cardiovascular  Auscultation:  Regular rate, without rubs, murmurs or gallops  Edema/varicosities:  Not grossly evident Abdominal  Soft,nontender, without masses, guarding or rebound.  Liver/spleen:  No organomegaly noted  Hernia:  None appreciated  Skin  Inspection:  Grossly normal   Breasts: Examined lying and sitting.     Right: Without masses, retractions, discharge or axillary adenopathy.     Left: Without masses, retractions, discharge or axillary adenopathy. Gentitourinary   Inguinal/mons:  Normal without inguinal adenopathy  External genitalia:  Normal  BUS/Urethra/Skene's glands:  Normal  Vagina:  Normal  Cervix:  Normal  Uterus:  normal in size, shape and contour.  Midline and mobile  Adnexa/parametria:     Rt: Without masses or tenderness.   Lt: Without masses or tenderness.  Anus and perineum: Normal  Digital rectal exam: Normal sphincter tone without palpated masses or tenderness  Assessment/Plan:  55 y.o. MWF G2P2 for annual examWith no complaints.  TVH with BSO for endometriosis on no HRT Osteopenia without  elevated FRAX Hypercholesterolemia-cardiologist manages labs and meds Thrombocytopenia stable  Plan: SBE's, continue annual screening mammogram, calcium rich diet, vitamin D 1000 daily encouraged. Instructed to have vitamin D level checked at primary care with next blood draw. Repeat DEXA, home safety, fall prevention and importance of continuing regular weightbearing exercise reviewed. UAHuel Cote WHNP, 11:44 AM 07/29/2016

## 2016-07-29 NOTE — Patient Instructions (Signed)
Sinus Headache A sinus headache occurs when the paranasal sinuses become clogged or swollen. Paranasal sinuses are air pockets within the bones of the face. Sinus headaches can range from mild to severe. CAUSES A sinus headache can result from various conditions that affect the sinuses, such as:  Colds.  Sinus infections.  Allergies. SYMPTOMS The main symptom of this condition is a headache that may feel like pain or pressure in the face, forehead, ears, or upper teeth. People who have a sinus headache often have other symptoms, such as:  Congested or runny nose.  Fever.  Inability to smell. Weather changes can make symptoms worse. DIAGNOSIS This condition may be diagnosed based on:  A physical exam and medical history.  Imaging tests, such as a CT scan and MRI, to check for problems with the sinuses.  A specialist may look into the sinuses with a tool that has a camera (endoscopy). TREATMENT Treatment for this condition depends on the cause.  Sinus pain that is caused by a sinus infection may be treated with antibiotic medicine.  Sinus pain that is caused by allergies may be helped by allergy medicines (antihistamines) and medicated nasal sprays.  Sinus pain that is caused by congestion may be helped by flushing the nose and sinuses with saline solution. HOME CARE INSTRUCTIONS  Take medicines only as directed by your health care provider.  If you were prescribed an antibiotic medicine, finish all of it even if you start to feel better.  If you have congestion, use a nasal spray to help reduce pressure.  If directed, apply a warm, moist washcloth to your face to help relieve pain. SEEK MEDICAL CARE IF:  You have headaches more than one time each week.  You have sensitivity to light or sound.  You have a fever.  You feel sick to your stomach (nauseous) or you throw up (vomit).  Your headaches do not get better with treatment. Many people think that they have a  sinus headache when they actually have migraines or tension headaches. SEEK IMMEDIATE MEDICAL CARE IF:  You have vision problems.  You have sudden, severe pain in your face or head.  You have a seizure.  You are confused.  You have a stiff neck.   This information is not intended to replace advice given to you by your health care provider. Make sure you discuss any questions you have with your health care provider.   Document Released: 12/04/2004 Document Revised: 03/13/2015 Document Reviewed: 10/23/2014 Elsevier Interactive Patient Education [employer]. Menopause is a normal process in which your reproductive ability comes to an end. This process happens gradually over a span of months to years, usually between the ages of 20 and 15. Menopause is complete when you have missed 12 consecutive menstrual periods. It is important to talk with your health care provider about some of the most common conditions that affect postmenopausal women, such as heart disease, cancer, and bone loss (osteoporosis). Adopting a healthy lifestyle and getting preventive care can help to promote your health and wellness. Those actions can also lower your chances of developing some of these common conditions. WHAT SHOULD I KNOW ABOUT MENOPAUSE? During menopause, you may experience a number of symptoms, such as:  Moderate-to-severe hot flashes.  Night sweats.  Decrease in sex drive.  Mood swings.  Headaches.  Tiredness.  Irritability.  Memory problems.  Insomnia. Choosing to treat or not to treat menopausal changes is an individual decision that you make with your health  care provider. WHAT SHOULD I KNOW ABOUT HORMONE REPLACEMENT THERAPY AND SUPPLEMENTS? Hormone therapy products are effective for treating symptoms that are associated with menopause, such as hot flashes and night sweats. Hormone replacement carries certain risks, especially as you become older. If you are thinking about using  estrogen or estrogen with progestin treatments, discuss the benefits and risks with your health care provider. WHAT SHOULD I KNOW ABOUT HEART DISEASE AND STROKE? Heart disease, heart attack, and stroke become more likely as you age. This may be due, in part, to the hormonal changes that your body experiences during menopause. These can affect how your body processes dietary fats, triglycerides, and cholesterol. Heart attack and stroke are both medical emergencies. There are many things that you can do to help prevent heart disease and stroke:  Have your blood pressure checked at least every 1-2 years. High blood pressure causes heart disease and increases the risk of stroke.  If you are 30-61 years old, ask your health care provider if you should take aspirin to prevent a heart attack or a stroke.  Do not use any tobacco products, including cigarettes, chewing tobacco, or electronic cigarettes. If you need help quitting, ask your health care provider.  It is important to eat a healthy diet and maintain a healthy weight.  Be sure to include plenty of vegetables, fruits, low-fat dairy products, and lean protein.  Avoid eating foods that are high in solid fats, added sugars, or salt (sodium).  Get regular exercise. This is one of the most important things that you can do for your health.  Try to exercise for at least 150 minutes each week. The type of exercise that you do should increase your heart rate and make you sweat. This is known as moderate-intensity exercise.  Try to do strengthening exercises at least twice each week. Do these in addition to the moderate-intensity exercise.  Know your numbers.Ask your health care provider to check your cholesterol and your blood glucose. Continue to have your blood tested as directed by your health care provider. WHAT SHOULD I KNOW ABOUT CANCER SCREENING? There are several types of cancer. Take the following steps to reduce your risk and to catch any  cancer development as early as possible. Breast Cancer  Practice breast self-awareness.  This means understanding how your breasts normally appear and feel.  It also means doing regular breast self-exams. Let your health care provider know about any changes, no matter how small.  If you are 66 or older, have a clinician do a breast exam (clinical breast exam or CBE) every year. Depending on your age, family history, and medical history, it may be recommended that you also have a yearly breast X-ray (mammogram).  If you have a family history of breast cancer, talk with your health care provider about genetic screening.  If you are at high risk for breast cancer, talk with your health care provider about having an MRI and a mammogram every year.  Breast cancer (BRCA) gene test is recommended for women who have family members with BRCA-related cancers. Results of the assessment will determine the need for genetic counseling and BRCA1 and for BRCA2 testing. BRCA-related cancers include these types:  Breast. This occurs in males or females.  Ovarian.  Tubal. This may also be called fallopian tube cancer.  Cancer of the abdominal or pelvic lining (peritoneal cancer).  Prostate.  Pancreatic. Cervical, Uterine, and Ovarian Cancer Your health care provider may recommend that you be screened regularly for  cancer of the pelvic organs. These include your ovaries, uterus, and vagina. This screening involves a pelvic exam, which includes checking for microscopic changes to the surface of your cervix (Pap test).  For women ages 21-65, health care providers may recommend a pelvic exam and a Pap test every three years. For women ages 71-65, they may recommend the Pap test and pelvic exam, combined with testing for human papilloma virus (HPV), every five years. Some types of HPV increase your risk of cervical cancer. Testing for HPV may also be done on women of any age who have unclear Pap test  results.  Other health care providers may not recommend any screening for nonpregnant women who are considered low risk for pelvic cancer and have no symptoms. Ask your health care provider if a screening pelvic exam is right for you.  If you have had past treatment for cervical cancer or a condition that could lead to cancer, you need Pap tests and screening for cancer for at least 20 years after your treatment. If Pap tests have been discontinued for you, your risk factors (such as having a new sexual partner) need to be reassessed to determine if you should start having screenings again. Some women have medical problems that increase the chance of getting cervical cancer. In these cases, your health care provider may recommend that you have screening and Pap tests more often.  If you have a family history of uterine cancer or ovarian cancer, talk with your health care provider about genetic screening.  If you have vaginal bleeding after reaching menopause, tell your health care provider.  There are currently no reliable tests available to screen for ovarian cancer. Lung Cancer Lung cancer screening is recommended for adults 58-90 years old who are at high risk for lung cancer because of a history of smoking. A yearly low-dose CT scan of the lungs is recommended if you:  Currently smoke.  Have a history of at least 30 pack-years of smoking and you currently smoke or have quit within the past 15 years. A pack-year is smoking an average of one pack of cigarettes per day for one year. Yearly screening should:  Continue until it has been 15 years since you quit.  Stop if you develop a health problem that would prevent you from having lung cancer treatment. Colorectal Cancer  This type of cancer can be detected and can often be prevented.  Routine colorectal cancer screening usually begins at age 43 and continues through age 25.  If you have risk factors for colon cancer, your health care  provider may recommend that you be screened at an earlier age.  If you have a family history of colorectal cancer, talk with your health care provider about genetic screening.  Your health care provider may also recommend using home test kits to check for hidden blood in your stool.  A small camera at the end of a tube can be used to examine your colon directly (sigmoidoscopy or colonoscopy). This is done to check for the earliest forms of colorectal cancer.  Direct examination of the colon should be repeated every 5-10 years until age 35. However, if early forms of precancerous polyps or small growths are found or if you have a family history or genetic risk for colorectal cancer, you may need to be screened more often. Skin Cancer  Check your skin from head to toe regularly.  Monitor any moles. Be sure to tell your health care provider:  About any  new moles or changes in moles, especially if there is a change in a mole's shape or color.  If you have a mole that is larger than the size of a pencil eraser.  If any of your family members has a history of skin cancer, especially at a young age, talk with your health care provider about genetic screening.  Always use sunscreen. Apply sunscreen liberally and repeatedly throughout the day.  Whenever you are outside, protect yourself by wearing long sleeves, pants, a wide-brimmed hat, and sunglasses. WHAT SHOULD I KNOW ABOUT OSTEOPOROSIS? Osteoporosis is a condition in which bone destruction happens more quickly than new bone creation. After menopause, you may be at an increased risk for osteoporosis. To help prevent osteoporosis or the bone fractures that can happen because of osteoporosis, the following is recommended:  If you are 99-19 years old, get at least 1,000 mg of calcium and at least 600 mg of vitamin D per day.  If you are older than age 85 but younger than age 69, get at least 1,200 mg of calcium and at least 600 mg of vitamin D  per day.  If you are older than age 50, get at least 1,200 mg of calcium and at least 800 mg of vitamin D per day. Smoking and excessive alcohol intake increase the risk of osteoporosis. Eat foods that are rich in calcium and vitamin D, and do weight-bearing exercises several times each week as directed by your health care provider. WHAT SHOULD I KNOW ABOUT HOW MENOPAUSE AFFECTS [place]? Depression may occur at any age, but it is more common as you become older. Common symptoms of depression include:  Low or sad mood.  Changes in sleep patterns.  Changes in appetite or eating patterns.  Feeling an overall lack of motivation or enjoyment of activities that you previously enjoyed.  Frequent crying spells. Talk with your health care provider if you think that you are experiencing depression. WHAT SHOULD I KNOW ABOUT IMMUNIZATIONS? It is important that you get and maintain your immunizations. These include:  Tetanus, diphtheria, and pertussis (Tdap) booster vaccine.  Influenza every year before the flu season begins.  Pneumonia vaccine.  Shingles vaccine. Your health care provider may also recommend other immunizations.   This information is not intended to replace advice given to you by your health care provider. Make sure you discuss any questions you have with your health care provider.   Document Released: 12/19/2005 Document Revised: 11/17/2014 Document Reviewed: 06/29/2014 Elsevier Interactive Patient Education [employer].

## 2016-08-12 ENCOUNTER — Ambulatory Visit: Payer: BLUE CROSS/BLUE SHIELD | Admitting: Internal Medicine

## 2016-08-13 DIAGNOSIS — G43019 Migraine without aura, intractable, without status migrainosus: Secondary | ICD-10-CM | POA: Diagnosis not present

## 2016-08-13 DIAGNOSIS — G43719 Chronic migraine without aura, intractable, without status migrainosus: Secondary | ICD-10-CM | POA: Diagnosis not present

## 2016-11-12 ENCOUNTER — Other Ambulatory Visit: Payer: Self-pay | Admitting: Specialist

## 2016-11-12 DIAGNOSIS — G43719 Chronic migraine without aura, intractable, without status migrainosus: Secondary | ICD-10-CM | POA: Diagnosis not present

## 2016-11-12 DIAGNOSIS — R51 Headache: Principal | ICD-10-CM

## 2016-11-12 DIAGNOSIS — R519 Headache, unspecified: Secondary | ICD-10-CM

## 2016-11-12 DIAGNOSIS — G43019 Migraine without aura, intractable, without status migrainosus: Secondary | ICD-10-CM | POA: Diagnosis not present

## 2016-11-12 DIAGNOSIS — H9190 Unspecified hearing loss, unspecified ear: Secondary | ICD-10-CM

## 2016-11-17 ENCOUNTER — Ambulatory Visit
Admission: RE | Admit: 2016-11-17 | Discharge: 2016-11-17 | Disposition: A | Discharge status: Home / Self Care | Payer: BLUE CROSS/BLUE SHIELD | Source: Ambulatory Visit | Attending: Specialist | Admitting: Specialist

## 2016-11-17 DIAGNOSIS — R519 Headache, unspecified: Secondary | ICD-10-CM

## 2016-11-17 DIAGNOSIS — H9190 Unspecified hearing loss, unspecified ear: Secondary | ICD-10-CM

## 2016-11-17 DIAGNOSIS — R51 Headache: Secondary | ICD-10-CM | POA: Diagnosis not present

## 2016-12-10 ENCOUNTER — Other Ambulatory Visit: Payer: Self-pay | Admitting: Gynecology

## 2016-12-10 DIAGNOSIS — Z1231 Encounter for screening mammogram for malignant neoplasm of breast: Secondary | ICD-10-CM

## 2016-12-17 ENCOUNTER — Other Ambulatory Visit: Payer: Self-pay | Admitting: *Deleted

## 2016-12-17 MED ORDER — ATORVASTATIN CALCIUM 10 MG PO TABS: ORAL_TABLET | ORAL | 1 refills | Status: DC

## 2016-12-19 ENCOUNTER — Other Ambulatory Visit: Payer: Self-pay | Admitting: Women's Health

## 2016-12-19 DIAGNOSIS — G4700 Insomnia, unspecified: Secondary | ICD-10-CM

## 2016-12-19 NOTE — Telephone Encounter (Signed)
Called into pharmacy

## 2016-12-19 NOTE — Telephone Encounter (Signed)
Okay for refill?  

## 2017-01-07 ENCOUNTER — Ambulatory Visit
Admission: RE | Admit: 2017-01-07 | Discharge: 2017-01-07 | Disposition: A | Discharge status: Home / Self Care | Payer: BLUE CROSS/BLUE SHIELD | Source: Ambulatory Visit | Attending: Gynecology | Admitting: Gynecology

## 2017-01-07 DIAGNOSIS — Z1231 Encounter for screening mammogram for malignant neoplasm of breast: Secondary | ICD-10-CM | POA: Diagnosis not present

## 2017-02-18 DIAGNOSIS — G43019 Migraine without aura, intractable, without status migrainosus: Secondary | ICD-10-CM | POA: Diagnosis not present

## 2017-02-18 DIAGNOSIS — G43719 Chronic migraine without aura, intractable, without status migrainosus: Secondary | ICD-10-CM | POA: Diagnosis not present

## 2017-03-05 ENCOUNTER — Other Ambulatory Visit: Payer: Self-pay | Admitting: Internal Medicine

## 2017-03-05 DIAGNOSIS — Z0000 Encounter for general adult medical examination without abnormal findings: Secondary | ICD-10-CM

## 2017-03-09 ENCOUNTER — Encounter: Payer: Self-pay | Admitting: Family Medicine

## 2017-03-09 ENCOUNTER — Other Ambulatory Visit (INDEPENDENT_AMBULATORY_CARE_PROVIDER_SITE_OTHER): Payer: BLUE CROSS/BLUE SHIELD

## 2017-03-09 ENCOUNTER — Ambulatory Visit (INDEPENDENT_AMBULATORY_CARE_PROVIDER_SITE_OTHER): Payer: BLUE CROSS/BLUE SHIELD | Admitting: Family Medicine

## 2017-03-09 VITALS — BP 100/66 | HR 91 | Temp 99.7°F | Ht 64.0 in | Wt 120.2 lb

## 2017-03-09 DIAGNOSIS — R6889 Other general symptoms and signs: Secondary | ICD-10-CM | POA: Diagnosis not present

## 2017-03-09 DIAGNOSIS — Z0000 Encounter for general adult medical examination without abnormal findings: Secondary | ICD-10-CM | POA: Diagnosis not present

## 2017-03-09 LAB — LIPID PANEL
Cholesterol: 165 mg/dL (ref 0–200)
HDL: 76.3 mg/dL
LDL Cholesterol: 80 mg/dL (ref 0–99)
NonHDL: 89.02
Total CHOL/HDL Ratio: 2
Triglycerides: 46 mg/dL (ref 0.0–149.0)
VLDL: 9.2 mg/dL (ref 0.0–40.0)

## 2017-03-09 LAB — CBC
HEMATOCRIT: 38.4 % (ref 36.0–46.0)
Hemoglobin: 13 g/dL (ref 12.0–15.0)
MCHC: 33.8 g/dL (ref 30.0–36.0)
MCV: 94.6 fl (ref 78.0–100.0)
Platelets: 112 10*3/uL — ABNORMAL LOW (ref 150.0–400.0)
RBC: 4.06 Mil/uL (ref 3.87–5.11)
RDW: 13.1 % (ref 11.5–15.5)
WBC: 14.5 10*3/uL — ABNORMAL HIGH (ref 4.0–10.5)

## 2017-03-09 LAB — COMPREHENSIVE METABOLIC PANEL
ALBUMIN: 4.2 g/dL (ref 3.5–5.2)
ALT: 16 U/L (ref 0–35)
AST: 18 U/L (ref 0–37)
Alkaline Phosphatase: 68 U/L (ref 39–117)
BUN: 20 mg/dL (ref 6–23)
CO2: 25 meq/L (ref 19–32)
Calcium: 9.2 mg/dL (ref 8.4–10.5)
Chloride: 105 mEq/L (ref 96–112)
Creatinine, Ser: 0.93 mg/dL (ref 0.40–1.20)
GFR: 65.97 mL/min (ref >60.00)
Glucose, Bld: 130 mg/dL — ABNORMAL HIGH (ref 70–99)
Potassium: 3.6 mEq/L (ref 3.5–5.1)
SODIUM: 137 meq/L (ref 135–145)
Total Bilirubin: 0.7 mg/dL (ref 0.2–1.2)
Total Protein: 6.9 g/dL (ref 6.0–8.3)

## 2017-03-09 LAB — TSH: TSH: 0.39 u[IU]/mL (ref 0.35–4.50)

## 2017-03-09 LAB — VITAMIN D 25 HYDROXY (VIT D DEFICIENCY, FRACTURES): VITD: 48.2 ng/mL (ref 30.00–100.00)

## 2017-03-09 MED ORDER — OSELTAMIVIR PHOSPHATE 75 MG PO CAPS: 75.0000 mg | ORAL_CAPSULE | Freq: Two times a day (BID) | ORAL | 0 refills | Status: DC

## 2017-03-09 NOTE — Progress Notes (Signed)
Pre visit review using our clinic review tool, if applicable. No additional management support is needed unless otherwise documented below in the visit note. 

## 2017-03-09 NOTE — Progress Notes (Signed)
Dr. Frederico Hamman T. Raja Caputi, MD, [facility] [place], [address] Phone: [phone] Fax: [phone]  03/09/2017  Patient: Darlene Spencer, MRN: [id number], DOB: 1961/01/22, 56 y.o.  Primary Physician:  Arnette Norris, MD   Chief Complaint  Patient presents with  . Nasal Congestion  . Fever  . Diarrhea  . Emesis  . Fatigue  . Sore Throat   Subjective:   Darlene Spencer presents with runny nose, sneezing, cough, sore throat, malaise, myalgias, arthralgia, chills, and fever.  Started sat and Sunday - started sat and Sunday, throat is sore, no energy. Having some myalgia and some arthralgia. Has felt feverish at home.   Nose dripping and feeling very tired.   ? recent exposure to others with similar symptoms.   The patent denies sore throat as the primary complaint. Denies sthortness of breath/wheezing, otalgia, facial pain, abdominal pain, changes in bowel or bladder.  Generally feels terrible  Tmax: 100  PMH, PHS, Allergies, Problem List, Medications, Family History, and Social History have all been reviewed.  Patient Active Problem List   Diagnosis Date Noted  . Leukopenia 07/01/2016  . Well woman exam (no gynecological exam) 04/05/2015  . Migraines 04/05/2015  . Thrombocytopenia ([place]) 04/05/2015  . Hypercholesteremia 07/04/2013    Past Medical History:  Diagnosis Date  . Endometriosis 2007   endometriotc cyst Rt.ovary  . Headache(784.0)   . Osteopenia 08/2013   T score -1.1 FRAX 5.0%/0.4%    Past Surgical History:  Procedure Laterality Date  . OOPHORECTOMY  2007   BSO  . PELVIC LAPAROSCOPY  2007   BSO  . VAGINAL HYSTERECTOMY  1998    Social History   Social History  . Marital status: Married    Spouse name: N/A  . Number of children: 2  . Years of education: N/A   Occupational History  .  [employer]   Social History Main Topics  . Smoking status: Never Smoker  .  Smokeless tobacco: Never Used  . Alcohol use No  . Drug use: No  . Sexual activity: Yes    Birth control/ protection: Surgical   Other Topics Concern  . Not on file   Social History Narrative  . No narrative on file    Family History  Problem Relation Age of Onset  . Hypertension Mother   . Heart disease Father   . Heart failure Father   . Heart attack Father 29  . Cancer Maternal Grandfather     colon  . Hypertension Maternal Grandfather     No Known Allergies  Medication list reviewed and updated in full in [facility].  ROS as above, eating and drinking - tolerating PO. Urinating normally. No excessive vomitting or diarrhea. O/w as above.  Objective:   Blood pressure 100/66, pulse 91, temperature 99.7 F (37.6 C), temperature source Oral, height 5\' 4"  (1.626 m), weight 120 lb 4 oz (54.5 kg), last menstrual period 11/10/1996.  Gen: WDWN, NAD; A & O x3, cooperative. Pleasant.Globally Non-toxic HEENT: Normocephalic and atraumatic. Throat clear, w/o exudate, R TM clear, L TM - good landmarks, No fluid present. rhinnorhea. No frontal or maxillary sinus T. MMM NECK: Anterior cervical  LAD is absent CV: RRR, No M/G/R, cap refill <2 sec PULM: Breathing comfortably in no respiratory distress. no wheezing, crackles, rhonchi ABD: S,NT,ND,+BS. No HSM. No rebound. EXT: No c/c/e PSYCH: Friendly, good eye contact MSK: Nml gait   Assessment and Plan:  Flu-like symptoms   Supportive care, fluids, cough medicines as needed, and anti-pyretics. Infection control emphasized, including OOW or school until AF 24 hours.  Flulike symptoms, cannot exclude influenza.  Follow-up: When necessary  New Prescriptions   OSELTAMIVIR (TAMIFLU) 75 MG CAPSULE    Take 1 capsule (75 mg total) by mouth 2 (two) times daily.   Signed,  Maud Deed. Aarnav Steagall, MD   Patient's Medications  New Prescriptions   OSELTAMIVIR (TAMIFLU) 75 MG CAPSULE    Take 1 capsule (75 mg total) by mouth 2  (two) times daily.  Previous Medications   ATORVASTATIN (LIPITOR) 10 MG TABLET    TAKE 1 TABLET(10 MG) BY MOUTH DAILY   CALCIUM CARBONATE-VITAMIN D (TH CALCIUM CARBONATE-VITAMIN D) 600-400 MG-UNIT PER TABLET    Take 1 tablet by mouth daily at 6 PM.    FISH OIL-OMEGA-3 FATTY ACIDS 1000 MG CAPSULE    Take 1 g by mouth daily.     MULTIPLE VITAMIN (MULTIVITAMIN) TABLET    Take 1 tablet by mouth daily.     TOPIRAMATE (TOPAMAX) 100 MG TABLET    Take 100 mg by mouth daily at 6 PM.  Modified Medications   No medications on file  Discontinued Medications   AMOXICILLIN (AMOXIL) 500 MG CAPSULE    Take 1 capsule (500 mg total) by mouth 3 (three) times daily.   ZOLPIDEM (AMBIEN) 10 MG TABLET    TAKE 1 TABLET BY MOUTH AT BEDTIME AS NEEDED FOR SLEEP

## 2017-03-10 ENCOUNTER — Telehealth: Payer: Self-pay

## 2017-03-10 NOTE — Telephone Encounter (Signed)
Spoke with Pamala Hurry.  She returned to work today.  Work note written and faxed to [phone] as requested.

## 2017-03-10 NOTE — Telephone Encounter (Signed)
Pt left v/m; pt was seen 03/09/17; pt is feeling a lot better today; no fever, still some congestion. Pt did not know she would need a note to return to work but her employer has asked for note to return to work. In office note 03/09/17 said can't exclude influenza.Please advise.

## 2017-03-11 ENCOUNTER — Encounter: Payer: Self-pay | Admitting: Family Medicine

## 2017-03-11 ENCOUNTER — Ambulatory Visit (INDEPENDENT_AMBULATORY_CARE_PROVIDER_SITE_OTHER): Payer: BLUE CROSS/BLUE SHIELD | Admitting: Family Medicine

## 2017-03-11 VITALS — BP 100/80 | HR 62 | Ht 64.0 in | Wt 118.4 lb

## 2017-03-11 DIAGNOSIS — R739 Hyperglycemia, unspecified: Secondary | ICD-10-CM

## 2017-03-11 DIAGNOSIS — Z0000 Encounter for general adult medical examination without abnormal findings: Secondary | ICD-10-CM | POA: Diagnosis not present

## 2017-03-11 DIAGNOSIS — Z1211 Encounter for screening for malignant neoplasm of colon: Secondary | ICD-10-CM | POA: Diagnosis not present

## 2017-03-11 DIAGNOSIS — R7989 Other specified abnormal findings of blood chemistry: Secondary | ICD-10-CM | POA: Diagnosis not present

## 2017-03-11 DIAGNOSIS — E7800 Pure hypercholesterolemia, unspecified: Secondary | ICD-10-CM

## 2017-03-11 DIAGNOSIS — D696 Thrombocytopenia, unspecified: Secondary | ICD-10-CM | POA: Diagnosis not present

## 2017-03-11 LAB — CBC WITH DIFFERENTIAL/PLATELET
BASOS PCT: 0.8 % (ref 0.0–3.0)
Basophils Absolute: 0.1 10*3/uL (ref 0.0–0.1)
EOS ABS: 0.1 10*3/uL (ref 0.0–0.7)
EOS PCT: 0.9 % (ref 0.0–5.0)
HCT: 39.8 % (ref 36.0–46.0)
Hemoglobin: 13.4 g/dL (ref 12.0–15.0)
LYMPHS ABS: 1.2 10*3/uL (ref 0.7–4.0)
Lymphocytes Relative: 17.4 % (ref 12.0–46.0)
MCHC: 33.5 g/dL (ref 30.0–36.0)
MCV: 94.9 fl (ref 78.0–100.0)
MONO ABS: 0.5 10*3/uL (ref 0.1–1.0)
Monocytes Relative: 7.7 % (ref 3.0–12.0)
NEUTROS PCT: 73.2 % (ref 43.0–77.0)
Neutro Abs: 5 10*3/uL (ref 1.4–7.7)
Platelets: 135 10*3/uL — ABNORMAL LOW (ref 150.0–400.0)
RBC: 4.2 Mil/uL (ref 3.87–5.11)
RDW: 13.3 % (ref 11.5–15.5)
WBC: 6.8 10*3/uL (ref 4.0–10.5)

## 2017-03-11 LAB — HEMOGLOBIN A1C: HEMOGLOBIN A1C: 5.7 % (ref 4.6–6.5)

## 2017-03-11 NOTE — Progress Notes (Signed)
Subjective:   Patient ID: Darlene Spencer, female    DOB: 1961/05/03, 55 y.o.   MRN: [id number]  Darlene Spencer is a pleasant 56 y.o. year old female who presents to clinic today with Annual Exam  and follow up of chronic medical conditions on 03/11/2017  HPI: Colonoscopy  8/26/08Carlean Purl- 10 year recall Mammogram 01/07/17 Remote h/o hysterectomy, had BME done by GYN.  Hyperglycemia- fasting glucose on CMET was 130.  She has never had elevated glucose on fasting lasts.  Denies increased thirst or urination.  CBC was abnormal as well- low platelets (denies bleeding) and high WBC.  Has had recent URI.  HLD- taking lipitor.Denies myalgias.  Lab Results  Component Value Date   CHOL 165 03/09/2017   HDL 76.30 03/09/2017   LDLCALC 80 03/09/2017   LDLDIRECT 139.1 05/09/2013   TRIG 46.0 03/09/2017   CHOLHDL 2 03/09/2017   Migraines- rarely gets migraines now that she is taking Topamax 100 mg daily.   Lab Results  Component Value Date   HGBA1C 5.3 03/30/2015   Lab Results  Component Value Date   TSH 0.39 03/09/2017      Lab Results  Component Value Date   WBC 14.5 (H) 03/09/2017   HGB 13.0 03/09/2017   HCT 38.4 03/09/2017   MCV 94.6 03/09/2017   PLT 112.0 (L) 03/09/2017   Lab Results  Component Value Date   NA 137 03/09/2017   K 3.6 03/09/2017   CL 105 03/09/2017   CO2 25 03/09/2017   Lab Results  Component Value Date   CREATININE 0.93 03/09/2017   Lab Results  Component Value Date   CHOL 165 03/09/2017   HDL 76.30 03/09/2017   LDLCALC 80 03/09/2017   LDLDIRECT 139.1 05/09/2013   TRIG 46.0 03/09/2017   CHOLHDL 2 03/09/2017   Lab Results  Component Value Date   VITAMINB12 449 03/30/2015   Current Outpatient Prescriptions on File Prior to Visit  Medication Sig Dispense Refill  . atorvastatin (LIPITOR) 10 MG tablet TAKE 1 TABLET(10 MG) BY MOUTH DAILY 90 tablet 1  . Calcium Carbonate-Vitamin D (TH CALCIUM CARBONATE-VITAMIN D) 600-400 MG-UNIT per tablet  Take 1 tablet by mouth daily at 6 PM.     . fish oil-omega-3 fatty acids 1000 MG capsule Take 1 g by mouth daily.      . Multiple Vitamin (MULTIVITAMIN) tablet Take 1 tablet by mouth daily.      Marland Kitchen oseltamivir (TAMIFLU) 75 MG capsule Take 1 capsule (75 mg total) by mouth 2 (two) times daily. 10 capsule 0  . topiramate (TOPAMAX) 100 MG tablet Take 100 mg by mouth daily at 6 PM.     No current facility-administered medications on file prior to visit.     No Known Allergies  Past Medical History:  Diagnosis Date  . Endometriosis 2007   endometriotc cyst Rt.ovary  . Headache(784.0)   . Osteopenia 08/2013   T score -1.1 FRAX 5.0%/0.4%    Past Surgical History:  Procedure Laterality Date  . OOPHORECTOMY  2007   BSO  . PELVIC LAPAROSCOPY  2007   BSO  . VAGINAL HYSTERECTOMY  1998    Family History  Problem Relation Age of Onset  . Hypertension Mother   . Heart disease Father   . Heart failure Father   . Heart attack Father 29  . Cancer Maternal Grandfather     colon  . Hypertension Maternal Grandfather     Social History   Social History  .  Marital status: Married    Spouse name: N/A  . Number of children: 2  . Years of education: N/A   Occupational History  .  [employer]   Social History Main Topics  . Smoking status: Never Smoker  . Smokeless tobacco: Never Used  . Alcohol use No  . Drug use: No  . Sexual activity: Yes    Birth control/ protection: Surgical   Other Topics Concern  . Not on file   Social History Narrative  . No narrative on file   The PMH, PSH, Social History, Family History, Medications, and allergies have been reviewed in [facility], and have been updated if relevant.   Review of Systems  Constitutional: Negative.   HENT: Negative.   Eyes: Negative.   Respiratory: Negative.   Cardiovascular: Negative.   Gastrointestinal: Negative.   Endocrine: Negative.   Genitourinary: Negative.   Musculoskeletal: Negative.     Skin: Negative.   Allergic/Immunologic: Negative.   Neurological: Negative.   Hematological: Negative.   Psychiatric/Behavioral: Negative.        Objective:    BP 100/80 (BP Location: Right Arm, Patient Position: Sitting, Cuff Size: Normal)   Pulse 62   Ht 5\' 4"  (1.626 m)   Wt 118 lb 6.4 oz (53.7 kg)   LMP 11/10/1996   BMI 20.32 kg/m    Physical Exam  Constitutional: She is oriented to person, place, and time. She appears well-developed and well-nourished. No distress.  HENT:  Head: Normocephalic and atraumatic.  Eyes: Conjunctivae are normal.  Neck: Normal range of motion.  Cardiovascular: Normal rate and regular rhythm.   Pulmonary/Chest: Effort normal and breath sounds normal.  Abdominal: Soft.  Musculoskeletal: Normal range of motion. She exhibits no edema.  Neurological: She is alert and oriented to person, place, and time. No cranial nerve deficit.  Skin: Skin is warm.  Sun spots throughout  Psychiatric: She has a normal mood and affect. Her behavior is normal. Judgment and thought content normal.  Nursing note and vitals reviewed.         Assessment & Plan:   Well woman exam (no gynecological exam)  Hypercholesteremia No Follow-up on file.

## 2017-03-11 NOTE — Assessment & Plan Note (Signed)
?   Lab error. Check a1c today. A.gree

## 2017-03-11 NOTE — Assessment & Plan Note (Signed)
Reviewed preventive care protocols, scheduled due services, and updated immunizations Discussed nutrition, exercise, diet, and healthy lifestyle.  

## 2017-03-11 NOTE — Assessment & Plan Note (Signed)
History of thrombocytopenia but lower this year. Also with elevated WBC- ? Due to acute URI but will repeat today to rule out lab error.

## 2017-03-11 NOTE — Patient Instructions (Signed)
Great to see you.  We are referring you to a gastroenterologist.  I will call you with your lab results.

## 2017-03-12 ENCOUNTER — Telehealth: Payer: Self-pay | Admitting: Internal Medicine

## 2017-03-12 ENCOUNTER — Ambulatory Visit: Payer: BLUE CROSS/BLUE SHIELD | Admitting: Family Medicine

## 2017-03-12 NOTE — Telephone Encounter (Signed)
Patient has not been seen in the office in 10 years.  I left a message for her to call the office and schedule an office visit for her concerns.

## 2017-03-25 ENCOUNTER — Encounter: Payer: Self-pay | Admitting: Gynecology

## 2017-04-13 ENCOUNTER — Encounter: Payer: Self-pay | Admitting: Internal Medicine

## 2017-05-27 DIAGNOSIS — B359 Dermatophytosis, unspecified: Secondary | ICD-10-CM | POA: Diagnosis not present

## 2017-05-27 DIAGNOSIS — L603 Nail dystrophy: Secondary | ICD-10-CM | POA: Diagnosis not present

## 2017-05-27 DIAGNOSIS — L609 Nail disorder, unspecified: Secondary | ICD-10-CM | POA: Diagnosis not present

## 2017-06-04 ENCOUNTER — Other Ambulatory Visit: Payer: Self-pay | Admitting: Family Medicine

## 2017-06-23 ENCOUNTER — Ambulatory Visit (AMBULATORY_SURGERY_CENTER): Payer: Self-pay

## 2017-06-23 VITALS — Ht 64.0 in | Wt 118.4 lb

## 2017-06-23 DIAGNOSIS — Z1211 Encounter for screening for malignant neoplasm of colon: Secondary | ICD-10-CM

## 2017-06-23 NOTE — Progress Notes (Signed)
Per pt, no allergies to soy or egg products.Pt not taking any weight loss meds or using  O2 at home.   Pt refused Emmi video. 

## 2017-06-30 ENCOUNTER — Encounter: Payer: Self-pay | Admitting: Internal Medicine

## 2017-07-01 ENCOUNTER — Ambulatory Visit: Payer: BLUE CROSS/BLUE SHIELD | Admitting: Hematology and Oncology

## 2017-07-07 ENCOUNTER — Encounter: Payer: Self-pay | Admitting: Internal Medicine

## 2017-07-07 ENCOUNTER — Ambulatory Visit (AMBULATORY_SURGERY_CENTER): Payer: BLUE CROSS/BLUE SHIELD | Admitting: Internal Medicine

## 2017-07-07 VITALS — BP 94/55 | HR 61 | Temp 98.9°F | Resp 15 | Ht 64.0 in | Wt 118.0 lb

## 2017-07-07 DIAGNOSIS — K635 Polyp of colon: Secondary | ICD-10-CM

## 2017-07-07 DIAGNOSIS — Z1212 Encounter for screening for malignant neoplasm of rectum: Secondary | ICD-10-CM

## 2017-07-07 DIAGNOSIS — Z1211 Encounter for screening for malignant neoplasm of colon: Secondary | ICD-10-CM

## 2017-07-07 DIAGNOSIS — D124 Benign neoplasm of descending colon: Secondary | ICD-10-CM

## 2017-07-07 MED ORDER — SODIUM CHLORIDE 0.9 % IV SOLN: 500.0000 mL | INTRAVENOUS | Status: AC

## 2017-07-07 NOTE — Op Note (Signed)
[place] Patient Name: Darlene Spencer Procedure Date: 07/07/2017 9:05 AM MRN: [id number] Endoscopist: Gatha Mayer , MD Age: 56 Referring MD:  Date of Birth: Apr 22, 1961 Gender: Female Account #: [id number] Procedure:                Colonoscopy Indications:              Screening for colorectal malignant neoplasm, Last                            colonoscopy: 2008 Medicines:                Propofol per Anesthesia, Monitored Anesthesia Care Procedure:                Pre-Anesthesia Assessment:                           - Prior to the procedure, a History and Physical                            was performed, and patient medications and                            allergies were reviewed. The patient's tolerance of                            previous anesthesia was also reviewed. The risks                            and benefits of the procedure and the sedation                            options and risks were discussed with the patient.                            All questions were answered, and informed consent                            was obtained. Prior Anticoagulants: The patient has                            taken no previous anticoagulant or antiplatelet                            agents. ASA Grade Assessment: II - A patient with                            mild systemic disease. After reviewing the risks                            and benefits, the patient was deemed in                            satisfactory condition to undergo the procedure.  After obtaining informed consent, the colonoscope                            was passed under direct vision. Throughout the                            procedure, the patient's blood pressure, pulse, and                            oxygen saturations were monitored continuously. The                            Colonoscope was introduced through the anus and                            advanced to the the  cecum, identified by                            appendiceal orifice and ileocecal valve. The                            colonoscopy was performed without difficulty. The                            patient tolerated the procedure well. The quality                            of the bowel preparation was good. The bowel                            preparation used was Miralax. The ileocecal valve,                            appendiceal orifice, and rectum were photographed. Scope In: 9:09:49 AM Scope Out: 9:31:03 AM Scope Withdrawal Time: 0 hours 16 minutes 35 seconds  Total Procedure Duration: 0 hours 21 minutes 14 seconds  Findings:                 The perianal and digital rectal examinations were                            normal.                           A diminutive polyp was found in the descending                            colon. The polyp was flat. The polyp was removed                            with a cold snare. Resection and retrieval were                            complete. Verification of patient identification  for the specimen was done. Estimated blood loss was                            minimal.                           A few large-mouthed diverticula were found in the                            sigmoid colon.                           The exam was otherwise without abnormality on                            direct and retroflexion views. Complications:            No immediate complications. Estimated Blood Loss:     Estimated blood loss was minimal. Impression:               - One diminutive polyp in the descending colon,                            removed with a cold snare. Resected and retrieved.                           - Diverticulosis in the sigmoid colon.                           - The examination was otherwise normal on direct                            and retroflexion views. Recommendation:           - Patient has a contact number  available for                            emergencies. The signs and symptoms of potential                            delayed complications were discussed with the                            patient. Return to normal activities tomorrow.                            Written discharge instructions were provided to the                            patient.                           - Continue present medications.                           - Repeat colonoscopy is recommended. The  colonoscopy date will be determined after pathology                            results from today's exam become available for                            review.                           - Resume previous diet. Gatha Mayer, MD 07/07/2017 9:36:28 AM This report has been signed electronically.

## 2017-07-07 NOTE — Progress Notes (Signed)
Called to room to assist during endoscopic procedure.  Patient ID and intended procedure confirmed with present staff. Received instructions for my participation in the procedure from the performing physician.  

## 2017-07-07 NOTE — Progress Notes (Signed)
A and O x3. Report to RN. Tolerated MAC anesthesia well.

## 2017-07-07 NOTE — Patient Instructions (Addendum)
I found and removed one tiny polyp that looks benign.  I will let you know pathology results and when to have another routine colonoscopy by mail and/or My Chart.  I appreciate the opportunity to care for you. Gatha Mayer, MD, FACG   YOU HAD AN ENDOSCOPIC PROCEDURE TODAY AT [facility]:   Refer to the procedure report that was given to you for any specific questions about what was found during the examination.  If the procedure report does not answer your questions, please call your gastroenterologist to clarify.  If you requested that your care partner not be given the details of your procedure findings, then the procedure report has been included in a sealed envelope for you to review at your convenience later.  YOU SHOULD EXPECT: Some feelings of bloating in the abdomen. Passage of more gas than usual.  Walking can help get rid of the air that was put into your GI tract during the procedure and reduce the bloating. If you had a lower endoscopy (such as a colonoscopy or flexible sigmoidoscopy) you may notice spotting of blood in your stool or on the toilet paper. If you underwent a bowel prep for your procedure, you may not have a normal bowel movement for a few days.  Please Note:  You might notice some irritation and congestion in your nose or some drainage.  This is from the oxygen used during your procedure.  There is no need for concern and it should clear up in a day or so.  SYMPTOMS TO REPORT IMMEDIATELY:   Following lower endoscopy (colonoscopy or flexible sigmoidoscopy):  Excessive amounts of blood in the stool  Significant tenderness or worsening of abdominal pains  Swelling of the abdomen that is new, acute  Fever of 100F or higher   For urgent or emergent issues, a gastroenterologist can be reached at any hour by calling [phone].  Please read all handouts given to you by your recovery nurse.  DIET:  We do recommend a small meal at first,  but then you may proceed to your regular diet.  Drink plenty of fluids but you should avoid alcoholic beverages for 24 hours.  ACTIVITY:  You should plan to take it easy for the rest of today and you should NOT DRIVE or use heavy machinery until tomorrow (because of the sedation medicines used during the test).    FOLLOW UP: Our staff will call the number listed on your records the next business day following your procedure to check on you and address any questions or concerns that you may have regarding the information given to you following your procedure. If we do not reach you, we will leave a message.  However, if you are feeling well and you are not experiencing any problems, there is no need to return our call.  We will assume that you have returned to your regular daily activities without incident.  If any biopsies were taken you will be contacted by phone or by letter within the next 1-3 weeks.  Please call us at [phone] if you have not heard about the biopsies in 3 weeks.    SIGNATURES/CONFIDENTIALITY: You and/or your care partner have signed paperwork which will be entered into your electronic medical record.  These signatures attest to the fact that that the information above on your After Visit Summary has been reviewed and is understood.  Full responsibility of the confidentiality of this discharge information lies with you and/or  your care-partner.  Thank you for letting us take care of your healthcare needs today.

## 2017-07-08 ENCOUNTER — Telehealth: Payer: Self-pay

## 2017-07-08 NOTE — Telephone Encounter (Signed)
  Follow up Call-  Call back number 07/07/2017  Post procedure Call Back phone  # [phone]  Permission to leave phone message Yes  Some recent data might be hidden     Patient questions:  Do you have a fever, pain , or abdominal swelling? No. Pain Score  0 *  Have you tolerated food without any problems? Yes.    Have you been able to return to your normal activities? Yes.    Do you have any questions about your discharge instructions: Diet   No. Medications  No. Follow up visit  No.  Do you have questions or concerns about your Care? No.  Actions: * If pain score is 4 or above: No action needed, pain <4.

## 2017-07-08 NOTE — Telephone Encounter (Signed)
Name identifier, left a voicemail. 

## 2017-07-09 DIAGNOSIS — L308 Other specified dermatitis: Secondary | ICD-10-CM | POA: Diagnosis not present

## 2017-07-09 DIAGNOSIS — L603 Nail dystrophy: Secondary | ICD-10-CM | POA: Diagnosis not present

## 2017-07-09 DIAGNOSIS — B351 Tinea unguium: Secondary | ICD-10-CM | POA: Diagnosis not present

## 2017-07-09 DIAGNOSIS — Z79899 Other long term (current) drug therapy: Secondary | ICD-10-CM | POA: Diagnosis not present

## 2017-07-14 ENCOUNTER — Encounter: Payer: Self-pay | Admitting: Internal Medicine

## 2017-07-14 NOTE — Progress Notes (Signed)
Diminutive left hyperplastic polyp Recall 2028 My Chart letter

## 2017-08-10 ENCOUNTER — Encounter: Payer: Self-pay | Admitting: Women's Health

## 2017-08-10 ENCOUNTER — Ambulatory Visit (INDEPENDENT_AMBULATORY_CARE_PROVIDER_SITE_OTHER): Payer: BLUE CROSS/BLUE SHIELD | Admitting: Women's Health

## 2017-08-10 VITALS — BP 124/80 | Ht 64.0 in | Wt 120.0 lb

## 2017-08-10 DIAGNOSIS — B373 Candidiasis of vulva and vagina: Secondary | ICD-10-CM | POA: Diagnosis not present

## 2017-08-10 DIAGNOSIS — F5101 Primary insomnia: Secondary | ICD-10-CM

## 2017-08-10 DIAGNOSIS — Z01419 Encounter for gynecological examination (general) (routine) without abnormal findings: Secondary | ICD-10-CM | POA: Diagnosis not present

## 2017-08-10 DIAGNOSIS — M85851 Other specified disorders of bone density and structure, right thigh: Secondary | ICD-10-CM

## 2017-08-10 DIAGNOSIS — B3731 Acute candidiasis of vulva and vagina: Secondary | ICD-10-CM

## 2017-08-10 DIAGNOSIS — Z13220 Encounter for screening for lipoid disorders: Secondary | ICD-10-CM

## 2017-08-10 LAB — COMPREHENSIVE METABOLIC PANEL
AG RATIO: 1.7 (calc) (ref 1.0–2.5)
ALT: 12 U/L (ref 6–29)
AST: 17 U/L (ref 10–35)
Albumin: 4.4 g/dL (ref 3.6–5.1)
Alkaline phosphatase (APISO): 63 U/L (ref 33–130)
BUN: 19 mg/dL (ref 7–25)
CHLORIDE: 106 mmol/L (ref 98–110)
CO2: 27 mmol/L (ref 20–32)
Calcium: 9.4 mg/dL (ref 8.6–10.4)
Creat: 0.95 mg/dL (ref 0.50–1.05)
GLOBULIN: 2.6 g/dL (ref 1.9–3.7)
GLUCOSE: 90 mg/dL (ref 65–99)
POTASSIUM: 3.7 mmol/L (ref 3.5–5.3)
SODIUM: 139 mmol/L (ref 135–146)
Total Bilirubin: 0.5 mg/dL (ref 0.2–1.2)
Total Protein: 7 g/dL (ref 6.1–8.1)

## 2017-08-10 LAB — CBC WITH DIFFERENTIAL/PLATELET
BASOS PCT: 0.7 %
Basophils Absolute: 28 cells/uL (ref 0–200)
EOS ABS: 120 {cells}/uL (ref 15–500)
Eosinophils Relative: 3 %
HCT: 38.2 % (ref 35.0–45.0)
HEMOGLOBIN: 13 g/dL (ref 11.7–15.5)
Lymphs Abs: 1372 cells/uL (ref 850–3900)
MCH: 31.9 pg (ref 27.0–33.0)
MCHC: 34 g/dL (ref 32.0–36.0)
MCV: 93.6 fL (ref 80.0–100.0)
MONOS PCT: 10 %
MPV: 11.1 fL (ref 7.5–12.5)
NEUTROS ABS: 2080 {cells}/uL (ref 1500–7800)
Neutrophils Relative %: 52 %
Platelets: 130 10*3/uL — ABNORMAL LOW (ref 140–400)
RBC: 4.08 10*6/uL (ref 3.80–5.10)
RDW: 11.9 % (ref 11.0–15.0)
Total Lymphocyte: 34.3 %
WBC: 4 10*3/uL (ref 3.8–10.8)
WBCMIX: 400 {cells}/uL (ref 200–950)

## 2017-08-10 LAB — LIPID PANEL
Cholesterol: 244 mg/dL — ABNORMAL HIGH (ref <200)
HDL: 85 mg/dL (ref >50)
LDL Cholesterol (Calc): 142 mg/dL (calc) — ABNORMAL HIGH
NON-HDL CHOLESTEROL (CALC): 159 mg/dL — AB (ref <130)
Total CHOL/HDL Ratio: 2.9 (calc) (ref <5.0)
Triglycerides: 71 mg/dL (ref <150)

## 2017-08-10 MED ORDER — ZOLPIDEM TARTRATE 10 MG PO TABS: 10.0000 mg | ORAL_TABLET | Freq: Every evening | ORAL | 0 refills | Status: DC | PRN

## 2017-08-10 MED ORDER — FLUCONAZOLE 150 MG PO TABS: 150.0000 mg | ORAL_TABLET | Freq: Once | ORAL | 2 refills | Status: AC

## 2017-08-10 NOTE — Progress Notes (Signed)
Darlene Spencer 11/15/1960 [id number]    History:    Presents for annual exam.  TVH with BSO for endometriosis on no HRT. Normal Pap and mammogram history. 2014 osteopenia T score -1.1 FRAX 5%/0.4%. Primary care manages hypercholesterolemia. Insomnia rare use of Ambien, occasional yeast infections requesting Diflucan prescription. 06/2017 negative colon polyp, 10 year follow-up.  Past medical history, past surgical history, family history and social history were all reviewed and documented in the EPIC chart. 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 2 children both doing well.  ROS:  A ROS was performed and pertinent positives and negatives are included.  Exam:  Vitals:   08/10/17 0823  BP: 124/80  Weight: 120 lb (54.4 kg)  Height: 5\' 4"  (1.626 m)   Body mass index is 20.6 kg/m.   General appearance:  Normal Thyroid:  Symmetrical, normal in size, without palpable masses or nodularity. Respiratory  Auscultation:  Clear without wheezing or rhonchi Cardiovascular  Auscultation:  Regular rate, without rubs, murmurs or gallops  Edema/varicosities:  Not grossly evident Abdominal  Soft,nontender, without masses, guarding or rebound.  Liver/spleen:  No organomegaly noted  Hernia:  None appreciated  Skin  Inspection:  Grossly normal   Breasts: Examined lying and sitting.     Right: Without masses, retractions, discharge or axillary adenopathy.     Left: Without masses, retractions, discharge or axillary adenopathy. Gentitourinary   Inguinal/mons:  Normal without inguinal adenopathy  External genitalia:  Normal  BUS/Urethra/Skene's glands:  Normal  Vagina:  Normal  Cervix:  And uterus absent  Adnexa/parametria:     Rt: Without masses or tenderness.   Lt: Without masses or tenderness.  Anus and perineum: Normal  Digital rectal exam: Normal sphincter tone without palpated masses or tenderness  Assessment/Plan:  55 y.o. MWF G3 P2 for annual exam with no complaints.  TVH with BSO for  endometriosis on no HRT Osteopenia without elevated FRAX Hypercholesterolemia-primary care manages  meds Rt toenail fungus-dermatologist managing  Plan: Labs per request, CBC, CMP, lipid panel, will fax labs to dermatologist and primary care. SBE's, continue annual screening mammogram, calcium rich diet, vitamin D 2000 daily encouraged. Schedule DEXA. Reviewed importance of weightbearing exercise, balance exercise. Ambien 10 mg at bedtime when necessary #30 no refills. Sleep hygiene reviewed. Reviewed to use sparingly. Diflucan 150 by mouth 1 dose per request to have on hand. Instructed to call if no relief after using.    [employer], 9:39 AM 08/10/2017

## 2017-08-10 NOTE — Patient Instructions (Signed)
Health Maintenance for Postmenopausal Women Menopause is a normal process in which your reproductive ability comes to an end. This process happens gradually over a span of months to years, usually between the ages of 22 and 9. Menopause is complete when you have missed 12 consecutive menstrual periods. It is important to talk with your health care provider about some of the most common conditions that affect postmenopausal women, such as heart disease, cancer, and bone loss (osteoporosis). Adopting a healthy lifestyle and getting preventive care can help to promote your health and wellness. Those actions can also lower your chances of developing some of these common conditions. What should I know about menopause? During menopause, you may experience a number of symptoms, such as:  Moderate-to-severe hot flashes.  Night sweats.  Decrease in sex drive.  Mood swings.  Headaches.  Tiredness.  Irritability.  Memory problems.  Insomnia.  Choosing to treat or not to treat menopausal changes is an individual decision that you make with your health care provider. What should I know about hormone replacement therapy and supplements? Hormone therapy products are effective for treating symptoms that are associated with menopause, such as hot flashes and night sweats. Hormone replacement carries certain risks, especially as you become older. If you are thinking about using estrogen or estrogen with progestin treatments, discuss the benefits and risks with your health care provider. What should I know about heart disease and stroke? Heart disease, heart attack, and stroke become more likely as you age. This may be due, in part, to the hormonal changes that your body experiences during menopause. These can affect how your body processes dietary fats, triglycerides, and cholesterol. Heart attack and stroke are both medical emergencies. There are many things that you can do to help prevent heart disease  and stroke:  Have your blood pressure checked at least every 1-2 years. High blood pressure causes heart disease and increases the risk of stroke.  If you are 53-22 years old, ask your health care provider if you should take aspirin to prevent a heart attack or a stroke.  Do not use any tobacco products, including cigarettes, chewing tobacco, or electronic cigarettes. If you need help quitting, ask your health care provider.  It is important to eat a healthy diet and maintain a healthy weight. ? Be sure to include plenty of vegetables, fruits, low-fat dairy products, and lean protein. ? Avoid eating foods that are high in solid fats, added sugars, or salt (sodium).  Get regular exercise. This is one of the most important things that you can do for your health. ? Try to exercise for at least 150 minutes each week. The type of exercise that you do should increase your heart rate and make you sweat. This is known as moderate-intensity exercise. ? Try to do strengthening exercises at least twice each week. Do these in addition to the moderate-intensity exercise.  Know your numbers.Ask your health care provider to check your cholesterol and your blood glucose. Continue to have your blood tested as directed by your health care provider.  What should I know about cancer screening? There are several types of cancer. Take the following steps to reduce your risk and to catch any cancer development as early as possible. Breast Cancer  Practice breast self-awareness. ? This means understanding how your breasts normally appear and feel. ? It also means doing regular breast self-exams. Let your health care provider know about any changes, no matter how small.  If you are 40  or older, have a clinician do a breast exam (clinical breast exam or CBE) every year. Depending on your age, family history, and medical history, it may be recommended that you also have a yearly breast X-ray (mammogram).  If you  have a family history of breast cancer, talk with your health care provider about genetic screening.  If you are at high risk for breast cancer, talk with your health care provider about having an MRI and a mammogram every year.  Breast cancer (BRCA) gene test is recommended for women who have family members with BRCA-related cancers. Results of the assessment will determine the need for genetic counseling and BRCA1 and for BRCA2 testing. BRCA-related cancers include these types: ? Breast. This occurs in males or females. ? Ovarian. ? Tubal. This may also be called fallopian tube cancer. ? Cancer of the abdominal or pelvic lining (peritoneal cancer). ? Prostate. ? Pancreatic.  Cervical, Uterine, and Ovarian Cancer Your health care provider may recommend that you be screened regularly for cancer of the pelvic organs. These include your ovaries, uterus, and vagina. This screening involves a pelvic exam, which includes checking for microscopic changes to the surface of your cervix (Pap test).  For women ages 21-65, health care providers may recommend a pelvic exam and a Pap test every three years. For women ages 79-65, they may recommend the Pap test and pelvic exam, combined with testing for human papilloma virus (HPV), every five years. Some types of HPV increase your risk of cervical cancer. Testing for HPV may also be done on women of any age who have unclear Pap test results.  Other health care providers may not recommend any screening for nonpregnant women who are considered low risk for pelvic cancer and have no symptoms. Ask your health care provider if a screening pelvic exam is right for you.  If you have had past treatment for cervical cancer or a condition that could lead to cancer, you need Pap tests and screening for cancer for at least 20 years after your treatment. If Pap tests have been discontinued for you, your risk factors (such as having a new sexual partner) need to be  reassessed to determine if you should start having screenings again. Some women have medical problems that increase the chance of getting cervical cancer. In these cases, your health care provider may recommend that you have screening and Pap tests more often.  If you have a family history of uterine cancer or ovarian cancer, talk with your health care provider about genetic screening.  If you have vaginal bleeding after reaching menopause, tell your health care provider.  There are currently no reliable tests available to screen for ovarian cancer.  Lung Cancer Lung cancer screening is recommended for adults 69-62 years old who are at high risk for lung cancer because of a history of smoking. A yearly low-dose CT scan of the lungs is recommended if you:  Currently smoke.  Have a history of at least 30 pack-years of smoking and you currently smoke or have quit within the past 15 years. A pack-year is smoking an average of one pack of cigarettes per day for one year.  Yearly screening should:  Continue until it has been 15 years since you quit.  Stop if you develop a health problem that would prevent you from having lung cancer treatment.  Colorectal Cancer  This type of cancer can be detected and can often be prevented.  Routine colorectal cancer screening usually begins at  age 42 and continues through age 45.  If you have risk factors for colon cancer, your health care provider may recommend that you be screened at an earlier age.  If you have a family history of colorectal cancer, talk with your health care provider about genetic screening.  Your health care provider may also recommend using home test kits to check for hidden blood in your stool.  A small camera at the end of a tube can be used to examine your colon directly (sigmoidoscopy or colonoscopy). This is done to check for the earliest forms of colorectal cancer.  Direct examination of the colon should be repeated every  5-10 years until age 71. However, if early forms of precancerous polyps or small growths are found or if you have a family history or genetic risk for colorectal cancer, you may need to be screened more often.  Skin Cancer  Check your skin from head to toe regularly.  Monitor any moles. Be sure to tell your health care provider: ? About any new moles or changes in moles, especially if there is a change in a mole's shape or color. ? If you have a mole that is larger than the size of a pencil eraser.  If any of your family members has a history of skin cancer, especially at a young age, talk with your health care provider about genetic screening.  Always use sunscreen. Apply sunscreen liberally and repeatedly throughout the day.  Whenever you are outside, protect yourself by wearing long sleeves, pants, a wide-brimmed hat, and sunglasses.  What should I know about osteoporosis? Osteoporosis is a condition in which bone destruction happens more quickly than new bone creation. After menopause, you may be at an increased risk for osteoporosis. To help prevent osteoporosis or the bone fractures that can happen because of osteoporosis, the following is recommended:  If you are 46-71 years old, get at least 1,000 mg of calcium and at least 600 mg of vitamin D per day.  If you are older than age 55 but younger than age 65, get at least 1,200 mg of calcium and at least 600 mg of vitamin D per day.  If you are older than age 54, get at least 1,200 mg of calcium and at least 800 mg of vitamin D per day.  Smoking and excessive alcohol intake increase the risk of osteoporosis. Eat foods that are rich in calcium and vitamin D, and do weight-bearing exercises several times each week as directed by your health care provider. What should I know about how menopause affects my mental health? Depression may occur at any age, but it is more common as you become older. Common symptoms of depression  include:  Low or sad mood.  Changes in sleep patterns.  Changes in appetite or eating patterns.  Feeling an overall lack of motivation or enjoyment of activities that you previously enjoyed.  Frequent crying spells.  Talk with your health care provider if you think that you are experiencing depression. What should I know about immunizations? It is important that you get and maintain your immunizations. These include:  Tetanus, diphtheria, and pertussis (Tdap) booster vaccine.  Influenza every year before the flu season begins.  Pneumonia vaccine.  Shingles vaccine.  Your health care provider may also recommend other immunizations. This information is not intended to replace advice given to you by your health care provider. Make sure you discuss any questions you have with your health care provider. Document Released: 12/19/2005  Document Revised: 05/16/2016 Document Reviewed: 07/31/2015 Elsevier Interactive Patient Education  2018 [employer]  

## 2017-08-13 DIAGNOSIS — Z79899 Other long term (current) drug therapy: Secondary | ICD-10-CM | POA: Diagnosis not present

## 2017-08-13 DIAGNOSIS — B351 Tinea unguium: Secondary | ICD-10-CM | POA: Diagnosis not present

## 2017-08-13 DIAGNOSIS — L603 Nail dystrophy: Secondary | ICD-10-CM | POA: Diagnosis not present

## 2017-08-17 DIAGNOSIS — G43719 Chronic migraine without aura, intractable, without status migrainosus: Secondary | ICD-10-CM | POA: Diagnosis not present

## 2017-08-17 DIAGNOSIS — G43019 Migraine without aura, intractable, without status migrainosus: Secondary | ICD-10-CM | POA: Diagnosis not present

## 2017-09-03 ENCOUNTER — Encounter: Payer: Self-pay | Admitting: Gynecology

## 2017-09-03 ENCOUNTER — Ambulatory Visit (INDEPENDENT_AMBULATORY_CARE_PROVIDER_SITE_OTHER): Payer: BLUE CROSS/BLUE SHIELD

## 2017-09-03 ENCOUNTER — Other Ambulatory Visit: Payer: Self-pay | Admitting: Gynecology

## 2017-09-03 DIAGNOSIS — M85851 Other specified disorders of bone density and structure, right thigh: Secondary | ICD-10-CM

## 2017-09-03 DIAGNOSIS — M8589 Other specified disorders of bone density and structure, multiple sites: Secondary | ICD-10-CM

## 2017-09-03 DIAGNOSIS — Z13820 Encounter for screening for osteoporosis: Secondary | ICD-10-CM

## 2017-09-04 ENCOUNTER — Encounter: Payer: Self-pay | Admitting: Gynecology

## 2017-09-04 ENCOUNTER — Encounter: Payer: Self-pay | Admitting: *Deleted

## 2017-09-04 ENCOUNTER — Telehealth: Payer: Self-pay | Admitting: Gynecology

## 2017-09-04 NOTE — Telephone Encounter (Signed)
Sent pt my chart message to patient.

## 2017-09-04 NOTE — Telephone Encounter (Signed)
Tell patient her bone density continues to show some osteopenia with a slight loss from the last study.  It is not significant enough to consider medication but I would recommend attention to weightbearing exercise on a regular basis such as walking, adequate calcium intake to include 1500 mg total dietary calcium daily and to have a vitamin D level checked either through her primary physician's office or our office to make sure she is in therapeutic range.  Otherwise I would recommend repeating the bone density in 2 years.

## 2017-10-28 DIAGNOSIS — H524 Presbyopia: Secondary | ICD-10-CM | POA: Diagnosis not present

## 2018-01-19 ENCOUNTER — Emergency Department
Admission: EM | Admit: 2018-01-19 | Discharge: 2018-01-19 | Disposition: A | Discharge status: Home / Self Care | Payer: BLUE CROSS/BLUE SHIELD | Attending: Emergency Medicine | Admitting: Emergency Medicine

## 2018-01-19 ENCOUNTER — Other Ambulatory Visit: Payer: Self-pay

## 2018-01-19 ENCOUNTER — Emergency Department: Payer: BLUE CROSS/BLUE SHIELD

## 2018-01-19 ENCOUNTER — Encounter: Payer: Self-pay | Admitting: Emergency Medicine

## 2018-01-19 ENCOUNTER — Ambulatory Visit: Payer: Self-pay | Admitting: *Deleted

## 2018-01-19 ENCOUNTER — Ambulatory Visit: Payer: BLUE CROSS/BLUE SHIELD | Admitting: Internal Medicine

## 2018-01-19 DIAGNOSIS — Z79899 Other long term (current) drug therapy: Secondary | ICD-10-CM | POA: Insufficient documentation

## 2018-01-19 DIAGNOSIS — R6884 Jaw pain: Secondary | ICD-10-CM | POA: Insufficient documentation

## 2018-01-19 DIAGNOSIS — R0789 Other chest pain: Secondary | ICD-10-CM | POA: Diagnosis not present

## 2018-01-19 DIAGNOSIS — R079 Chest pain, unspecified: Secondary | ICD-10-CM | POA: Diagnosis not present

## 2018-01-19 DIAGNOSIS — Z7982 Long term (current) use of aspirin: Secondary | ICD-10-CM | POA: Diagnosis not present

## 2018-01-19 LAB — BASIC METABOLIC PANEL
Anion gap: 9 (ref 5–15)
BUN: 13 mg/dL (ref 6–20)
CALCIUM: 9.4 mg/dL (ref 8.9–10.3)
CO2: 23 mmol/L (ref 22–32)
CREATININE: 0.88 mg/dL (ref 0.44–1.00)
Chloride: 108 mmol/L (ref 101–111)
GFR calc Af Amer: >60 mL/min (ref >60)
GLUCOSE: 97 mg/dL (ref 65–99)
Potassium: 3.4 mmol/L — ABNORMAL LOW (ref 3.5–5.1)
SODIUM: 140 mmol/L (ref 135–145)

## 2018-01-19 LAB — CBC
HCT: 41.4 % (ref 35.0–47.0)
Hemoglobin: 13.8 g/dL (ref 12.0–16.0)
MCH: 31.5 pg (ref 26.0–34.0)
MCHC: 33.4 g/dL (ref 32.0–36.0)
MCV: 94.6 fL (ref 80.0–100.0)
PLATELETS: 127 10*3/uL — AB (ref 150–440)
RBC: 4.38 MIL/uL (ref 3.80–5.20)
RDW: 13.2 % (ref 11.5–14.5)
WBC: 3.3 10*3/uL — AB (ref 3.6–11.0)

## 2018-01-19 LAB — TROPONIN I
Troponin I: <0.03 ng/mL (ref <0.03)
Troponin I: <0.03 ng/mL (ref <0.03)

## 2018-01-19 NOTE — ED Provider Notes (Signed)
[facility] Emergency Department Provider Note   ____________________________________________    I have reviewed the triage vital signs and the nursing notes.   HISTORY  Chief Complaint Jaw Pain and Chest Pain     HPI Darlene Spencer is a 57 y.o. female who presents with complaints of tingling in the jaw, primarily the left side which started last night around dinnertime.  She also reports that her chest felt funny which she describes as a very mild pressure-like sensation.  Symptoms were constant overnight, she reports this is happened to her before, it is not exertionally related, seems to happen primarily with eating.  No history of heart disease.  No shortness of breath no pleurisy.  No fevers or chills.  No recent travel.  Has not taken anything for it  Past Medical History:  Diagnosis Date  . Endometriosis 2007   endometriotc cyst Rt.ovary  . Headache(784.0)   . Hyperlipidemia    on meds  . Osteopenia 08/2017   T score -1.6 FRAX 6.6% / 0.6%    Patient Active Problem List   Diagnosis Date Noted  . Abnormal CBC 03/11/2017  . Hyperglycemia 03/11/2017  . Leukopenia 07/01/2016  . Well woman exam (no gynecological exam) 04/05/2015  . Migraines 04/05/2015  . Thrombocytopenia (Colo) 04/05/2015  . Hypercholesteremia 07/04/2013    Past Surgical History:  Procedure Laterality Date  . COLONOSCOPY    . OOPHORECTOMY  2007   BSO  . PELVIC LAPAROSCOPY  2007   BSO  . VAGINAL HYSTERECTOMY  1998    Prior to Admission medications   Medication Sig Start Date End Date Taking? Authorizing Provider  aspirin EC 81 MG tablet Take 81 mg by mouth daily.    [provider]  atorvastatin (LIPITOR) 10 MG tablet TAKE 1 TABLET DAILY Patient not taking: Reported on 08/10/2017 06/04/17   Lucille Passy, MD  Calcium Carbonate-Vitamin D (TH CALCIUM CARBONATE-VITAMIN D) 600-400 MG-UNIT per tablet Take 1 tablet by mouth daily at 6 PM.     [provider]  FIBER PO Take by mouth. Fiber Well Sugar -Free Gummies-Take 2 daily    [provider]  fish oil-omega-3 fatty acids 1000 MG capsule Take 1 g by mouth daily.      [provider]  Multiple Vitamin (MULTIVITAMIN) tablet Take 1 tablet by mouth. Women's MVI-Take 2 chewables daily    [provider]  terbinafine (LAMISIL) 250 MG tablet Take 250 mg by mouth daily.    [provider]  topiramate (TOPAMAX) 100 MG tablet Take 100 mg by mouth daily at 6 PM. 03/28/15   [provider]  zolpidem (AMBIEN) 10 MG tablet Take 1 tablet (10 mg total) by mouth at bedtime as needed for sleep. 08/10/17 09/09/17  Huel Cote, NP     Allergies Patient has no known allergies.  Family History  Problem Relation Age of Onset  . Hypertension Mother   . Heart disease Father   . Heart failure Father   . Heart attack Father 10  . Cancer Maternal Grandfather        colon  . Hypertension Maternal Grandfather     Social History Social History   Tobacco Use  . Smoking status: Never Smoker  . Smokeless tobacco: Never Used  Substance Use Topics  . Alcohol use: No  . Drug use: No    Review of Systems  Constitutional: No fever/chills Eyes: No visual changes.  ENT: No sore throat.  Cardiovascular: As above Respiratory: Denies shortness of breath. Gastrointestinal: No abdominal pain.   Genitourinary: Negative for dysuria. Musculoskeletal: Negative for back pain. Skin: Negative for rash. Neurological: Negative for headaches   ____________________________________________   PHYSICAL EXAM:  VITAL SIGNS: ED Triage Vitals  Enc Vitals Group     BP 01/19/18 0826 121/86     Pulse Rate 01/19/18 0826 (!) 58     Resp 01/19/18 0826 18     Temp 01/19/18 0826 98 F (36.7 C)     Temp Source 01/19/18 0826 Oral     SpO2 01/19/18 0826 100 %     Weight 01/19/18 0826 56.7 kg (125 lb)     Height 01/19/18 0826 1.626 m (5\' 4" )     Head Circumference --      Peak  Flow --      Pain Score 01/19/18 0837 2     Pain Loc --      Pain Edu? --      Excl. in [place]? --     Constitutional: Alert and oriented. No acute distress. Pleasant and interactive Eyes: Conjunctivae are normal.   Nose: No congestion/rhinnorhea. Mouth/Throat: Mucous membranes are moist.   Cardiovascular: Normal rate, regular rhythm. Grossly normal heart sounds.  Good peripheral circulation. Respiratory: Normal respiratory effort.  No retractions. Lungs CTAB. Gastrointestinal: Soft and nontender. No distention.  No CVA tenderness. Genitourinary: deferred Musculoskeletal: No lower extremity tenderness nor edema.  Warm and well perfused Neurologic:  Normal speech and language. No gross focal neurologic deficits are appreciated.  Skin:  Skin is warm, dry and intact. No rash noted. Psychiatric: Mood and affect are normal. Speech and behavior are normal.  ____________________________________________   LABS (all labs ordered are listed, but only abnormal results are displayed)  Labs Reviewed  BASIC METABOLIC PANEL - Abnormal; Notable for the following components:      Result Value   Potassium 3.4 (*)    All other components within normal limits  CBC - Abnormal; Notable for the following components:   WBC 3.3 (*)    Platelets 127 (*)    All other components within normal limits  TROPONIN I  TROPONIN I   ____________________________________________  EKG  ED ECG REPORT I, Lavonia Drafts, the attending physician, personally viewed and interpreted this ECG.  Date: 01/19/2018  Rhythm: Bradycardia QRS Axis: normal Intervals: normal ST/T Wave abnormalities: normal Narrative Interpretation: no evidence of acute ischemia  ____________________________________________  RADIOLOGY  Chest x-ray normal ____________________________________________   PROCEDURES  Procedure(s) performed: No  Procedures   Critical Care performed:  No ____________________________________________   INITIAL IMPRESSION / ASSESSMENT AND PLAN / ED COURSE  Pertinent labs & imaging results that were available during my care of the patient were reviewed by me and considered in my medical decision making (see chart for details).  Patient well-appearing in no acute distress.  EKG is reassuring.  Initial troponin normal, lab work overall unremarkable.  Does not appear to be consistent with ACS, doubt angina.  Not consistent with PE given no pleurisy or shortness of breath.  Second troponin normal, will have the patient follow-up with cardiology for stress test as well, she agrees this plan    ____________________________________________   FINAL CLINICAL IMPRESSION(S) / ED DIAGNOSES  Final diagnoses:  Atypical chest pain        Note:  This document was prepared using Dragon voice recognition software and may include unintentional dictation errors. s   Lavonia Drafts, MD 01/19/18 1404

## 2018-01-19 NOTE — ED Triage Notes (Signed)
Says jaw pain and chset pain started last night.  Says still has jaw pain.

## 2018-01-19 NOTE — Telephone Encounter (Signed)
Tried to call pt but N/A/will try again/thx dmf

## 2018-01-19 NOTE — Telephone Encounter (Signed)
Please call to check on patient. 

## 2018-01-19 NOTE — Telephone Encounter (Signed)
Pt called with c/o chest pressure last night and had some jaw pain. No other symptoms. No h/a, n/v, sweating, weakness, or left side pain. Stated that she had spaghetti last night. Her B/P last night was 118/82 HR 62. She said she took one of her husband's NTG and it relieved her discomfort.  No chest pressure this morning. But still having the jaw pain. Pt wanted to make an appointment at [facility] to have an ekg. After talking with the patient and she stated that she had had chest pressure before this, time last week. Advised her to go to the emergency department to have her ekg and be evaluated.   Reason for Disposition . Pain also present in shoulder(s) or arm(s) or jaw  (Exception: pain is clearly made worse by movement)  Answer Assessment - Initial Assessment Questions 1. LOCATION: "Where does it hurt?"       Pressure in the middle of chest, and left jaw 2. RADIATION: "Does the pain go anywhere else?" (e.g., into neck, jaw, arms, back)     no 3. ONSET: "When did the chest pain begin?" (Minutes, hours or days)      Last night 4. PATTERN "Does the pain come and go, or has it been constant since it started?"  "Does it get worse with exertion?"      Constant, not worse with exertion 5. DURATION: "How long does it last" (e.g., seconds, minutes, hours)     constant 6. SEVERITY: "How bad is the pain?"  (e.g., Scale 1-10; mild, moderate, or severe)    - MILD (1-3): doesn't interfere with normal activities     - MODERATE (4-7): interferes with normal activities or awakens from sleep    - SEVERE (8-10): excruciating pain, unable to do any normal activities       #2 7. CARDIAC RISK FACTORS: "Do you have any history of heart problems or risk factors for heart disease?" (e.g., prior heart attack, angina; high blood pressure, diabetes, being overweight, high cholesterol, smoking, or strong family history of heart disease)     High cholesterol, no hx of heart disease 8. PULMONARY RISK FACTORS:  "Do you have any history of lung disease?"  (e.g., blood clots in lung, asthma, emphysema, birth control pills)     no 9. CAUSE: "What do you think is causing the chest pain?"     no 10. OTHER SYMPTOMS: "Do you have any other symptoms?" (e.g., dizziness, nausea, vomiting, sweating, fever, difficulty breathing, cough)       no 11. PREGNANCY: "Is there any chance you are pregnant?" "When was your last menstrual period?"       no  Protocols used: CHEST PAIN-A-AH

## 2018-01-19 NOTE — ED Notes (Signed)
Pt discharged to home.  Family member driving.  Discharge instructions reviewed.  Verbalized understanding.  No questions or concerns at this time.  Teach back verified.  Pt in NAD.  No items left in ED.   

## 2018-01-19 NOTE — ED Notes (Signed)
Lab results reviewed. Awaiting room for MD eval at this time. CXR WNL.

## 2018-01-20 NOTE — Telephone Encounter (Signed)
LMOVM for pt to RTC to advise how she is doing/thx dmf

## 2018-01-22 ENCOUNTER — Encounter: Payer: Self-pay | Admitting: Cardiology

## 2018-01-22 ENCOUNTER — Ambulatory Visit (INDEPENDENT_AMBULATORY_CARE_PROVIDER_SITE_OTHER): Payer: BLUE CROSS/BLUE SHIELD | Admitting: Cardiology

## 2018-01-22 VITALS — BP 116/82 | HR 59 | Ht 64.0 in | Wt 120.8 lb

## 2018-01-22 DIAGNOSIS — E782 Mixed hyperlipidemia: Secondary | ICD-10-CM

## 2018-01-22 DIAGNOSIS — Z8249 Family history of ischemic heart disease and other diseases of the circulatory system: Secondary | ICD-10-CM | POA: Diagnosis not present

## 2018-01-22 DIAGNOSIS — R079 Chest pain, unspecified: Secondary | ICD-10-CM

## 2018-01-22 NOTE — Patient Instructions (Signed)
Medication Instructions: Your physician recommends that you continue on your current medications as directed. Please refer to the Current Medication list given to you today.  If you need a refill on your cardiac medications before your next appointment, please call your pharmacy.   Procedures/Testing: Your physician has requested that you have an exercise stress myoview. For further information please visit [email or web address]. Please follow instruction sheet, as given. This will take place at [address], [address]   Follow-Up: Dr. Ellyn Hack would like for you to follow up pending the results.  Thank you for choosing Heartcare at [facility]!!

## 2018-01-22 NOTE — Progress Notes (Signed)
PCP: Lucille Passy, MD  Clinic Note: Chief Complaint  Patient presents with  . New Patient (Initial Visit)    chest & Jaw pain  . Hospitalization Follow-up    ER vist    HPI: RAELEE Spencer is a 57 y.o. female with a PMH below who presents today for ER visit follow-up after presenting with left-sided chest and jaw pain.. She is away for Darlene Spencer who is a patient of mine, she therefore requested that she be scheduled to see me for ER follow-up  Darlene Spencer went to the [facility] emergency room on 12 March for evaluation of left-sided chest and jaw pain.  She says it symptoms actually began the day before on the Monday night.  She started noticing right-sided jaw pain and then migrated to the center of the chest.  She actually notes that these episodes have come and gone in the past, this time it just did not go away and went down to the chest.  She was actually sitting not doing anything.  When the symptoms came back the following day she took nitroglycerin and it went away so she went to the emergency room. She thinks the symptom pretty was lasted almost all night long prior to going in.   Recent Hospitalizations: see above   Studies Personally Reviewed - (if available, images/films reviewed: From Epic Chart or Care Everywhere)  Ruled out for MI with negative troponins.  EKG was normal  Interval History: Darlene Spencer returns today stating that she has had off-and-on symptoms that led her to the emergency room for about a month or so, but the most notable symptoms were the last couple days.  She says that the every now and then goes is that her chest, but for the most part of the last 2 major episodes were the ones when it went to the chest.  The spell though seem to last quite a long time.  They are not exertional and because she is quite active usually.  They are not associated with any particular movement. She denies any dyspnea associated with it or nausea/vomiting, diaphoresis. No heart  failure symptoms of PND, orthopnea or edema.  No rapid irregular heartbeats or palpitations.  No syncope/near syncope or TIA/amaurosis fugax.  No claudication.   ROS: A comprehensive was performed. Review of Systems  Constitutional: Negative for chills and fever.  HENT: Negative for congestion and nosebleeds.   Respiratory: Negative for cough, shortness of breath and wheezing.   Musculoskeletal: Negative for falls and joint pain.  Neurological: Negative for dizziness.  Endo/Heme/Allergies: Negative for environmental allergies.  Psychiatric/Behavioral: The patient is nervous/anxious and has insomnia.   All other systems reviewed and are negative.  I have reviewed and (if needed) personally updated the patient's problem list, medications, allergies, past medical and surgical history, social and family history.   Past Medical History:  Diagnosis Date  . Endometriosis 2007   endometriotc cyst Rt.ovary  . Headache(784.0)   . Hyperlipidemia    on meds  . Osteopenia 08/2017   T score -1.6 FRAX 6.6% / 0.6%    Past Surgical History:  Procedure Laterality Date  . COLONOSCOPY    . OOPHORECTOMY  2007   BSO  . PELVIC LAPAROSCOPY  2007   BSO  . VAGINAL HYSTERECTOMY  1998    Current Meds  Medication Sig  . aspirin EC 81 MG tablet Take 81 mg by mouth daily.  Marland Kitchen atorvastatin (LIPITOR) 10 MG tablet TAKE 1 TABLET DAILY  .  Calcium Carbonate-Vitamin D (TH CALCIUM CARBONATE-VITAMIN D) 600-400 MG-UNIT per tablet Take 1 tablet by mouth daily at 6 PM.   . FIBER PO Take by mouth. Fiber Well Sugar -Free Gummies-Take 2 daily  . fish oil-omega-3 fatty acids 1000 MG capsule Take 1 g by mouth daily.    . Multiple Vitamin (MULTIVITAMIN) tablet Take 1 tablet by mouth. Women's MVI-Take 2 chewables daily  . topiramate (TOPAMAX) 100 MG tablet Take 100 mg by mouth daily at 6 PM.   Current Facility-Administered Medications for the 01/22/18 encounter (Office Visit) with Leonie Man, MD  Medication  .  0.9 %  sodium chloride infusion    No Known Allergies  Social History   Tobacco Use  . Smoking status: Never Smoker  . Smokeless tobacco: Never Used  Substance Use Topics  . Alcohol use: No  . 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  Has a community college education.  Lives with her husband Darlene Spencer.  They have 2 children and 3 grandchildren.  She walks at least an hour 3 times a week.  She tries to walk fast at a brisk pace.    family history includes Cancer in her maternal grandfather; Heart attack (age of onset: 3) in her father; Heart disease in her father; Heart failure in her father; Hypertension in her maternal grandfather and mother.  Wt Readings from Last 3 Encounters:  01/22/18 120 lb 12.8 oz (54.8 kg)  01/19/18 125 lb (56.7 kg)  08/10/17 120 lb (54.4 kg)    PHYSICAL EXAM BP 116/82 (BP Location: Right Arm)   Pulse (!) 59   Ht 5\' 4"  (1.626 m)   Wt 120 lb 12.8 oz (54.8 kg)   LMP 11/10/1996   BMI 20.74 kg/m  Physical Exam  Constitutional: She is oriented to person, place, and time. She appears well-developed and well-nourished. No distress.  HENT:  Head: Normocephalic and atraumatic.  Mouth/Throat: No oropharyngeal exudate.  Eyes: Conjunctivae and EOM are normal. Pupils are equal, round, and reactive to light.  Neck: Normal range of motion. Neck supple. No hepatojugular reflux and no JVD present. Carotid bruit is not present.  Cardiovascular: Normal rate, regular rhythm, normal heart sounds and intact distal pulses.  No extrasystoles are present. PMI is not displaced. Exam reveals no gallop and no friction rub.  No murmur heard. Pulmonary/Chest: Effort normal and breath sounds normal. No respiratory distress. She has no wheezes. She has no rales. She exhibits tenderness (Some left-sided sternal tenderness).  Musculoskeletal: Normal range of motion. She exhibits no edema or deformity.   Neurological: She is alert and oriented to person, place, and time. No cranial nerve deficit.  Skin: Skin is warm and dry. No erythema.  Psychiatric: She has a normal mood and affect. Her behavior is normal. Judgment and thought content normal.  Nursing note and vitals reviewed.    Adult ECG Report  Rate: 59;  Rhythm: sinus bradycardia and Otherwise normal axis, intervals and durations.;   Narrative Interpretation: Normal EKG   Other studies Reviewed: Additional studies/ records that were reviewed today include:  Recent Labs:   Lab Results  Component Value Date   CHOL 244 (H) 08/10/2017   HDL 85 08/10/2017   LDLCALC 142 (H) 08/10/2017   LDLDIRECT 139.1 05/09/2013   TRIG 71 08/10/2017   CHOLHDL 2.9 08/10/2017   Lab Results  Component Value Date   CREATININE 0.88 01/19/2018  BUN 13 01/19/2018   NA 140 01/19/2018   K 3.4 (L) 01/19/2018   CL 108 01/19/2018   CO2 23 01/19/2018   Lab Results  Component Value Date   HGB 13.8 01/19/2018   Lab Results  Component Value Date   HGBA1C 5.7 03/11/2017    ASSESSMENT / PLAN: Problem List Items Addressed This Visit    Hyperlipidemia (Chronic)    Remains on low-dose atorvastatin.  Labs followed by PCP.  Most recent lipid panel from October showed very poorly controlled LDL and total cholesterol.  Depending on results of this Myoview, may need to consider more aggressive management.      Relevant Orders   EKG 12-Lead (Completed)   MYOCARDIAL PERFUSION IMAGING   Family history of premature CAD    Father history of MI at a relatively young age.  With presenting symptoms being somewhat concerning for angina, we are checking with a Myoview stress test.  If this is negative, would potentially consider screening evaluation with coronary calcium scoring to determine the need for more aggressive lipid management.      Relevant Orders   EKG 12-Lead (Completed)   MYOCARDIAL PERFUSION IMAGING   Chest pain with moderate risk for cardiac  etiology - Primary    57 year old woman with hyperlipidemia and family history of heart disease in her father who presents here for evaluation of left-sided chest and jaw pain.  There are some typical and otherwise atypical features involved.  Need to exclude coronary ischemia. Plan: Treadmill Myoview stress test.      Relevant Orders   EKG 12-Lead (Completed)   MYOCARDIAL PERFUSION IMAGING      Current medicines are reviewed at length with the patient today. (+/- concerns) n/a The following changes have been made: n/a   Patient Instructions  Medication Instructions: Your physician recommends that you continue on your current medications as directed. Please refer to the Current Medication list given to you today.  If you need a refill on your cardiac medications before your next appointment, please call your pharmacy.   Procedures/Testing: Your physician has requested that you have an exercise stress myoview. For further information please visit [email or web address]. Please follow instruction sheet, as given. This will take place at [place], [address]   Follow-Up: Dr. Ellyn Hack would like for you to follow up pending the results.  Thank you for choosing Heartcare at [facility]!!        Studies Ordered:   Orders Placed This Encounter  Procedures  . MYOCARDIAL PERFUSION IMAGING  . EKG 12-Lead      Glenetta Hew, M.D., M.S. Interventional Cardiologist   Pager # [phone] Phone # [phone] [address] [place], [place] [address]   Thank you for choosing Heartcare at [facility]!!

## 2018-01-25 ENCOUNTER — Encounter: Payer: Self-pay | Admitting: Cardiology

## 2018-01-25 NOTE — Assessment & Plan Note (Signed)
Father history of MI at a relatively young age.  With presenting symptoms being somewhat concerning for angina, we are checking with a Myoview stress test.  If this is negative, would potentially consider screening evaluation with coronary calcium scoring to determine the need for more aggressive lipid management.

## 2018-01-25 NOTE — Assessment & Plan Note (Signed)
Remains on low-dose atorvastatin.  Labs followed by PCP.  Most recent lipid panel from October showed very poorly controlled LDL and total cholesterol.  Depending on results of this Myoview, may need to consider more aggressive management.

## 2018-01-25 NOTE — Assessment & Plan Note (Signed)
57 year old woman with hyperlipidemia and family history of heart disease in her father who presents here for evaluation of left-sided chest and jaw pain.  There are some typical and otherwise atypical features involved.  Need to exclude coronary ischemia. Plan: Treadmill Myoview stress test.

## 2018-01-28 ENCOUNTER — Telehealth (HOSPITAL_COMMUNITY): Payer: Self-pay

## 2018-01-28 NOTE — Telephone Encounter (Signed)
Encounter counter. 

## 2018-02-02 ENCOUNTER — Ambulatory Visit (HOSPITAL_COMMUNITY)
Admission: RE | Admit: 2018-02-02 | Discharge: 2018-02-02 | Disposition: A | Discharge status: Home / Self Care | Payer: BLUE CROSS/BLUE SHIELD | Source: Ambulatory Visit | Attending: Cardiovascular Disease | Admitting: Cardiovascular Disease

## 2018-02-02 DIAGNOSIS — R079 Chest pain, unspecified: Secondary | ICD-10-CM | POA: Diagnosis not present

## 2018-02-02 DIAGNOSIS — E782 Mixed hyperlipidemia: Secondary | ICD-10-CM | POA: Diagnosis not present

## 2018-02-02 DIAGNOSIS — Z8249 Family history of ischemic heart disease and other diseases of the circulatory system: Secondary | ICD-10-CM | POA: Diagnosis not present

## 2018-02-02 LAB — MYOCARDIAL PERFUSION IMAGING
CHL CUP RESTING HR STRESS: 50 {beats}/min
CSEPEDS: 17 s
CSEPHR: 100 %
Estimated workload: 15.8 METS
Exercise duration (min): 13 min
LVDIAVOL: 76 mL (ref 46–106)
LVSYSVOL: 30 mL
MPHR: 162 {beats}/min
Peak HR: 162 {beats}/min
RPE: 17
SDS: 2
SRS: 0
SSS: 2
TID: 0.87

## 2018-02-02 MED ORDER — TECHNETIUM TC 99M TETROFOSMIN IV KIT
10.6000 | PACK | Freq: Once | INTRAVENOUS | Status: AC | PRN
  Administered 2018-02-02: 10.6 via INTRAVENOUS
  Filled 2018-02-02: qty 11

## 2018-02-02 MED ORDER — TECHNETIUM TC 99M TETROFOSMIN IV KIT
29.1000 | PACK | Freq: Once | INTRAVENOUS | Status: AC | PRN
  Administered 2018-02-02: 29.1 via INTRAVENOUS
  Filled 2018-02-02: qty 30

## 2018-02-03 ENCOUNTER — Telehealth: Payer: Self-pay | Admitting: *Deleted

## 2018-02-03 NOTE — Telephone Encounter (Signed)
Spoke to patient. Result given . Verbalized understanding   PATIENT WANTED TO REVIEW INFORMATION ON MYCHART BEFORE  SCHEDULING A FOLLOW UP APPOINTMENT. PATIENT IS AWARE SHE MAY CALL / USE MYCHART TO MAKE AN APPOINTMENT.  ROUTED TO PRIMARY

## 2018-02-03 NOTE — Telephone Encounter (Signed)
-----   Message from Leonie Man, MD sent at 02/02/2018  5:41 PM EDT ----- Nuclear Stress Test Results:  Excellent exercise capacity (13 METS). Hypertensive response to stress. -->  This is often a sign of deconditioning -gentle blood pressure control may benefit symptoms. Normal exercise nuclear stress test with no evidence for prior infarct or ischemia on EKG or nuclear images.  Glenetta Hew, MD   Please forward to YBF:XOVA, Marciano Sequin, MD

## 2018-02-15 DIAGNOSIS — G43719 Chronic migraine without aura, intractable, without status migrainosus: Secondary | ICD-10-CM | POA: Diagnosis not present

## 2018-02-15 DIAGNOSIS — G43019 Migraine without aura, intractable, without status migrainosus: Secondary | ICD-10-CM | POA: Diagnosis not present

## 2018-02-22 ENCOUNTER — Other Ambulatory Visit: Payer: Self-pay | Admitting: Gynecology

## 2018-02-22 DIAGNOSIS — Z139 Encounter for screening, unspecified: Secondary | ICD-10-CM

## 2018-02-25 ENCOUNTER — Ambulatory Visit
Admission: RE | Admit: 2018-02-25 | Discharge: 2018-02-25 | Disposition: A | Discharge status: Home / Self Care | Payer: BLUE CROSS/BLUE SHIELD | Source: Ambulatory Visit | Attending: Gynecology | Admitting: Gynecology

## 2018-02-25 DIAGNOSIS — Z139 Encounter for screening, unspecified: Secondary | ICD-10-CM

## 2018-02-25 DIAGNOSIS — Z1231 Encounter for screening mammogram for malignant neoplasm of breast: Secondary | ICD-10-CM | POA: Diagnosis not present

## 2018-03-01 ENCOUNTER — Other Ambulatory Visit: Payer: Self-pay | Admitting: Family Medicine

## 2018-03-13 ENCOUNTER — Other Ambulatory Visit: Payer: Self-pay | Admitting: Women's Health

## 2018-03-13 DIAGNOSIS — F5101 Primary insomnia: Secondary | ICD-10-CM

## 2018-03-15 NOTE — Telephone Encounter (Signed)
Ok for refill? 

## 2018-03-16 NOTE — Telephone Encounter (Signed)
Rx called in 

## 2018-03-17 DIAGNOSIS — J069 Acute upper respiratory infection, unspecified: Secondary | ICD-10-CM | POA: Diagnosis not present

## 2018-04-06 DIAGNOSIS — R300 Dysuria: Secondary | ICD-10-CM | POA: Diagnosis not present

## 2018-04-06 DIAGNOSIS — R319 Hematuria, unspecified: Secondary | ICD-10-CM | POA: Diagnosis not present

## 2018-04-28 DIAGNOSIS — Z13220 Encounter for screening for lipoid disorders: Secondary | ICD-10-CM | POA: Diagnosis not present

## 2018-04-28 DIAGNOSIS — Z131 Encounter for screening for diabetes mellitus: Secondary | ICD-10-CM | POA: Diagnosis not present

## 2018-04-28 DIAGNOSIS — Z713 Dietary counseling and surveillance: Secondary | ICD-10-CM | POA: Diagnosis not present

## 2018-04-28 DIAGNOSIS — Z136 Encounter for screening for cardiovascular disorders: Secondary | ICD-10-CM | POA: Diagnosis not present

## 2018-08-18 ENCOUNTER — Other Ambulatory Visit: Payer: Self-pay | Admitting: Family Medicine

## 2018-08-18 NOTE — Telephone Encounter (Signed)
Pt must sched appt first/not seen since 5.2018 by TA/thx dmf

## 2018-09-06 DIAGNOSIS — Z23 Encounter for immunization: Secondary | ICD-10-CM | POA: Diagnosis not present

## 2018-09-28 DIAGNOSIS — G43719 Chronic migraine without aura, intractable, without status migrainosus: Secondary | ICD-10-CM | POA: Diagnosis not present

## 2018-09-28 DIAGNOSIS — G43019 Migraine without aura, intractable, without status migrainosus: Secondary | ICD-10-CM | POA: Diagnosis not present

## 2018-11-19 DIAGNOSIS — J0140 Acute pansinusitis, unspecified: Secondary | ICD-10-CM | POA: Diagnosis not present

## 2018-12-14 DIAGNOSIS — Z23 Encounter for immunization: Secondary | ICD-10-CM | POA: Diagnosis not present

## 2019-01-14 DIAGNOSIS — J018 Other acute sinusitis: Secondary | ICD-10-CM | POA: Diagnosis not present

## 2019-01-17 ENCOUNTER — Other Ambulatory Visit: Payer: Self-pay | Admitting: Gynecology

## 2019-01-17 ENCOUNTER — Other Ambulatory Visit: Payer: Self-pay | Admitting: Women's Health

## 2019-01-17 DIAGNOSIS — Z1231 Encounter for screening mammogram for malignant neoplasm of breast: Secondary | ICD-10-CM

## 2019-01-18 DIAGNOSIS — Z131 Encounter for screening for diabetes mellitus: Secondary | ICD-10-CM | POA: Diagnosis not present

## 2019-01-18 DIAGNOSIS — Z136 Encounter for screening for cardiovascular disorders: Secondary | ICD-10-CM | POA: Diagnosis not present

## 2019-01-18 DIAGNOSIS — Z6820 Body mass index (BMI) 20.0-20.9, adult: Secondary | ICD-10-CM | POA: Diagnosis not present

## 2019-01-18 DIAGNOSIS — Z13220 Encounter for screening for lipoid disorders: Secondary | ICD-10-CM | POA: Diagnosis not present

## 2019-01-18 DIAGNOSIS — E782 Mixed hyperlipidemia: Secondary | ICD-10-CM | POA: Diagnosis not present

## 2019-01-18 DIAGNOSIS — Z713 Dietary counseling and surveillance: Secondary | ICD-10-CM | POA: Diagnosis not present

## 2019-02-25 ENCOUNTER — Other Ambulatory Visit: Payer: Self-pay | Admitting: *Deleted

## 2019-02-25 DIAGNOSIS — F5101 Primary insomnia: Secondary | ICD-10-CM

## 2019-02-28 ENCOUNTER — Ambulatory Visit: Payer: BLUE CROSS/BLUE SHIELD

## 2019-03-14 DIAGNOSIS — G43019 Migraine without aura, intractable, without status migrainosus: Secondary | ICD-10-CM | POA: Diagnosis not present

## 2019-03-14 DIAGNOSIS — G43719 Chronic migraine without aura, intractable, without status migrainosus: Secondary | ICD-10-CM | POA: Diagnosis not present

## 2019-03-31 ENCOUNTER — Encounter: Payer: Self-pay | Admitting: Family Medicine

## 2019-03-31 ENCOUNTER — Ambulatory Visit (INDEPENDENT_AMBULATORY_CARE_PROVIDER_SITE_OTHER): Payer: BLUE CROSS/BLUE SHIELD | Admitting: Family Medicine

## 2019-03-31 ENCOUNTER — Other Ambulatory Visit: Payer: Self-pay

## 2019-03-31 VITALS — BP 112/84 | HR 59 | Temp 97.7°F | Ht 64.5 in | Wt 129.2 lb

## 2019-03-31 DIAGNOSIS — G43001 Migraine without aura, not intractable, with status migrainosus: Secondary | ICD-10-CM | POA: Diagnosis not present

## 2019-03-31 DIAGNOSIS — E782 Mixed hyperlipidemia: Secondary | ICD-10-CM

## 2019-03-31 DIAGNOSIS — F5104 Psychophysiologic insomnia: Secondary | ICD-10-CM | POA: Insufficient documentation

## 2019-03-31 DIAGNOSIS — F5101 Primary insomnia: Secondary | ICD-10-CM | POA: Diagnosis not present

## 2019-03-31 DIAGNOSIS — D72819 Decreased white blood cell count, unspecified: Secondary | ICD-10-CM | POA: Diagnosis not present

## 2019-03-31 LAB — LIPID PANEL
Cholesterol: 179 mg/dL (ref 0–200)
HDL: 82.3 mg/dL (ref >39.00)
LDL Cholesterol: 85 mg/dL (ref 0–99)
NonHDL: 96.62
Total CHOL/HDL Ratio: 2
Triglycerides: 56 mg/dL (ref 0.0–149.0)
VLDL: 11.2 mg/dL (ref 0.0–40.0)

## 2019-03-31 LAB — CBC WITH DIFFERENTIAL/PLATELET
Basophils Absolute: 0 10*3/uL (ref 0.0–0.1)
Basophils Relative: 1.1 % (ref 0.0–3.0)
Eosinophils Absolute: 0.1 10*3/uL (ref 0.0–0.7)
Eosinophils Relative: 2.3 % (ref 0.0–5.0)
HCT: 38.7 % (ref 36.0–46.0)
Hemoglobin: 13.2 g/dL (ref 12.0–15.0)
Lymphocytes Relative: 39.3 % (ref 12.0–46.0)
Lymphs Abs: 1.4 10*3/uL (ref 0.7–4.0)
MCHC: 34.1 g/dL (ref 30.0–36.0)
MCV: 94.2 fl (ref 78.0–100.0)
Monocytes Absolute: 0.3 10*3/uL (ref 0.1–1.0)
Monocytes Relative: 9.3 % (ref 3.0–12.0)
Neutro Abs: 1.7 10*3/uL (ref 1.4–7.7)
Neutrophils Relative %: 48 % (ref 43.0–77.0)
Platelets: 129 10*3/uL — ABNORMAL LOW (ref 150.0–400.0)
RBC: 4.11 Mil/uL (ref 3.87–5.11)
RDW: 13.3 % (ref 11.5–15.5)
WBC: 3.6 10*3/uL — ABNORMAL LOW (ref 4.0–10.5)

## 2019-03-31 LAB — HEPATIC FUNCTION PANEL
ALT: 12 U/L (ref 0–35)
AST: 14 U/L (ref 0–37)
Albumin: 4.2 g/dL (ref 3.5–5.2)
Alkaline Phosphatase: 62 U/L (ref 39–117)
Bilirubin, Direct: 0.1 mg/dL (ref 0.0–0.3)
Total Bilirubin: 0.5 mg/dL (ref 0.2–1.2)
Total Protein: 6.8 g/dL (ref 6.0–8.3)

## 2019-03-31 MED ORDER — ATORVASTATIN CALCIUM 10 MG PO TABS: 10.0000 mg | ORAL_TABLET | Freq: Every day | ORAL | 3 refills | Status: DC

## 2019-03-31 MED ORDER — ZOLPIDEM TARTRATE 10 MG PO TABS: 10.0000 mg | ORAL_TABLET | Freq: Every evening | ORAL | 0 refills | Status: DC | PRN

## 2019-03-31 NOTE — Assessment & Plan Note (Signed)
Stable. Follows with specialist

## 2019-03-31 NOTE — Progress Notes (Signed)
Subjective:     Darlene Spencer is a 58 y.o. female presenting for Transfer of Care (from Dr. Deborra Medina. Also goes to headache clinic for migraines); Medication Refill; and Would like lab work done (has low WBCs count)     HPI   # Migraines - taking topimax currently - works pretty well - recently has had some new headaches   # HLD - doing well on current medication - no side effects  #Leukopenia - has been noted on a few different CBC - no additional work-up done - no fever/chills, weightloss  #Insomnia - no issues falling asleep - does occasionally have trouble staying asleep - rare use of ambien which is helpful   Review of Systems  Constitutional: Negative for chills, fever and unexpected weight change.  Neurological: Positive for headaches (occasional).  Psychiatric/Behavioral: Positive for sleep disturbance.     Social History   Tobacco Use  Smoking Status Never Smoker  Smokeless Tobacco Never Used        Objective:    BP Readings from Last 3 Encounters:  03/31/19 112/84  01/22/18 116/82  01/19/18 (!) 122/93   Wt Readings from Last 3 Encounters:  03/31/19 129 lb 4 oz (58.6 kg)  02/02/18 120 lb (54.4 kg)  01/22/18 120 lb 12.8 oz (54.8 kg)    BP 112/84   Pulse (!) 59   Temp 97.7 F (36.5 C)   Ht 5' 4.5" (1.638 m)   Wt 129 lb 4 oz (58.6 kg)   LMP 11/10/1996   SpO2 98%   BMI 21.84 kg/m    Physical Exam Constitutional:      General: She is not in acute distress.    Appearance: She is well-developed. She is not diaphoretic.  HENT:     Right Ear: External ear normal.     Left Ear: External ear normal.     Nose: Nose normal.  Eyes:     Conjunctiva/sclera: Conjunctivae normal.  Neck:     Musculoskeletal: Neck supple.  Cardiovascular:     Rate and Rhythm: Normal rate and regular rhythm.     Heart sounds: No murmur.  Pulmonary:     Effort: Pulmonary effort is normal. No respiratory distress.     Breath sounds: Normal breath sounds. No  wheezing.  Skin:    General: Skin is warm and dry.     Capillary Refill: Capillary refill takes less than 2 seconds.  Neurological:     Mental Status: She is alert. Mental status is at baseline.  Psychiatric:        Mood and Affect: Mood normal.        Behavior: Behavior normal.        Thought Content: Thought content normal.        Judgment: Judgment normal.   The 10-year ASCVD risk score Mikey Bussing DC Jr., et al., 2013) is: 2%   Values used to calculate the score:     Age: 25 years     Sex: Female     Is Non-Hispanic African American: No     Diabetic: No     Tobacco smoker: No     Systolic Blood Pressure: 696 mmHg     Is BP treated: No     HDL Cholesterol: 85 mg/dL     Total Cholesterol: 244 mg/dL         Assessment & Plan:   Problem List Items Addressed This Visit      Cardiovascular and Mediastinum   Migraines  Stable. Follows with specialist      Relevant Medications   atorvastatin (LIPITOR) 10 MG tablet     Other   Hyperlipidemia (Chronic)    Last cholesterol in good range. Will recheck cholesterol. FmHx of MI in father      Relevant Medications   atorvastatin (LIPITOR) 10 MG tablet   Other Relevant Orders   Hepatic function panel   Lipid panel   Leukopenia    Noted intermittently over the last few years. No additional work-up done. Will re-check today. No signs of illness or systemic disease      Relevant Orders   CBC with Differential   Primary insomnia - Primary    Intermittent use of ambien. Refill seems appropriate.       Relevant Medications   zolpidem (AMBIEN) 10 MG tablet       Return for Annual exam.  Lesleigh Noe, MD

## 2019-03-31 NOTE — Assessment & Plan Note (Signed)
Last cholesterol in good range. Will recheck cholesterol. FmHx of MI in father

## 2019-03-31 NOTE — Assessment & Plan Note (Addendum)
Noted intermittently over the last few years. No additional work-up done. Will re-check today. No signs of illness or systemic disease

## 2019-03-31 NOTE — Patient Instructions (Signed)
Great to meet you today!  Go to the lab before you leave

## 2019-03-31 NOTE — Assessment & Plan Note (Signed)
Intermittent use of ambien. Refill seems appropriate.

## 2019-04-04 ENCOUNTER — Encounter: Payer: Self-pay | Admitting: Family Medicine

## 2019-04-04 DIAGNOSIS — F5101 Primary insomnia: Secondary | ICD-10-CM

## 2019-04-04 DIAGNOSIS — Z1159 Encounter for screening for other viral diseases: Secondary | ICD-10-CM

## 2019-04-05 MED ORDER — ZOLPIDEM TARTRATE 10 MG PO TABS: 10.0000 mg | ORAL_TABLET | Freq: Every evening | ORAL | 0 refills | Status: DC | PRN

## 2019-04-05 NOTE — Telephone Encounter (Signed)
Dr. Einar Pheasant, Zolpidem was refilled during office visit as a Phone in so I pulled it down again today and changed it to normal e-sribe if you could resent it. Atorvastatin was sent in during an office visit.

## 2019-04-05 NOTE — Addendum Note (Signed)
Addended by: Kris Mouton on: 04/05/2019 08:49 AM   Modules accepted: Orders

## 2019-04-12 ENCOUNTER — Ambulatory Visit
Admission: RE | Admit: 2019-04-12 | Discharge: 2019-04-12 | Disposition: A | Discharge status: Home / Self Care | Payer: BLUE CROSS/BLUE SHIELD | Source: Ambulatory Visit | Attending: Women's Health | Admitting: Women's Health

## 2019-04-12 ENCOUNTER — Ambulatory Visit: Payer: BLUE CROSS/BLUE SHIELD

## 2019-04-12 ENCOUNTER — Other Ambulatory Visit: Payer: Self-pay

## 2019-04-12 DIAGNOSIS — Z1231 Encounter for screening mammogram for malignant neoplasm of breast: Secondary | ICD-10-CM | POA: Diagnosis not present

## 2019-04-15 ENCOUNTER — Other Ambulatory Visit (INDEPENDENT_AMBULATORY_CARE_PROVIDER_SITE_OTHER): Payer: BLUE CROSS/BLUE SHIELD

## 2019-04-15 DIAGNOSIS — Z1159 Encounter for screening for other viral diseases: Secondary | ICD-10-CM | POA: Diagnosis not present

## 2019-04-18 LAB — HEPATITIS C ANTIBODY
Hepatitis C Ab: NONREACTIVE
SIGNAL TO CUT-OFF: 0.01 (ref <1.00)

## 2019-04-26 DIAGNOSIS — Z23 Encounter for immunization: Secondary | ICD-10-CM | POA: Diagnosis not present

## 2019-05-10 DIAGNOSIS — Z1283 Encounter for screening for malignant neoplasm of skin: Secondary | ICD-10-CM | POA: Diagnosis not present

## 2019-05-10 DIAGNOSIS — D1800 Hemangioma unspecified site: Secondary | ICD-10-CM | POA: Diagnosis not present

## 2019-05-10 DIAGNOSIS — L820 Inflamed seborrheic keratosis: Secondary | ICD-10-CM | POA: Diagnosis not present

## 2019-05-10 DIAGNOSIS — L578 Other skin changes due to chronic exposure to nonionizing radiation: Secondary | ICD-10-CM | POA: Diagnosis not present

## 2019-05-10 DIAGNOSIS — L570 Actinic keratosis: Secondary | ICD-10-CM | POA: Diagnosis not present

## 2019-05-10 DIAGNOSIS — I8393 Asymptomatic varicose veins of bilateral lower extremities: Secondary | ICD-10-CM | POA: Diagnosis not present

## 2019-07-30 DIAGNOSIS — Z23 Encounter for immunization: Secondary | ICD-10-CM | POA: Diagnosis not present

## 2019-08-09 ENCOUNTER — Encounter: Payer: Self-pay | Admitting: Gynecology

## 2019-10-24 ENCOUNTER — Other Ambulatory Visit: Payer: Self-pay | Admitting: Family Medicine

## 2019-10-24 DIAGNOSIS — F5101 Primary insomnia: Secondary | ICD-10-CM

## 2019-10-24 NOTE — Telephone Encounter (Signed)
LOV 03/31/2019 for Transfer of care. No future appointments. Last filled on 04/05/2019 #30 with 0 refill.

## 2019-11-02 DIAGNOSIS — G43719 Chronic migraine without aura, intractable, without status migrainosus: Secondary | ICD-10-CM | POA: Diagnosis not present

## 2019-11-02 DIAGNOSIS — G43019 Migraine without aura, intractable, without status migrainosus: Secondary | ICD-10-CM | POA: Diagnosis not present

## 2019-12-13 IMAGING — CR DG CHEST 2V
1 series · 2 of 2 positions shown · non-contrast
Comparison: 03/30/2015

CLINICAL DATA: Chest pain since last evening.

EXAM:
CHEST - 2 VIEW

[Series 1: dg chest 2 view · 0.14mm/px · 2 of 2 slices shown]
[im 1/2]
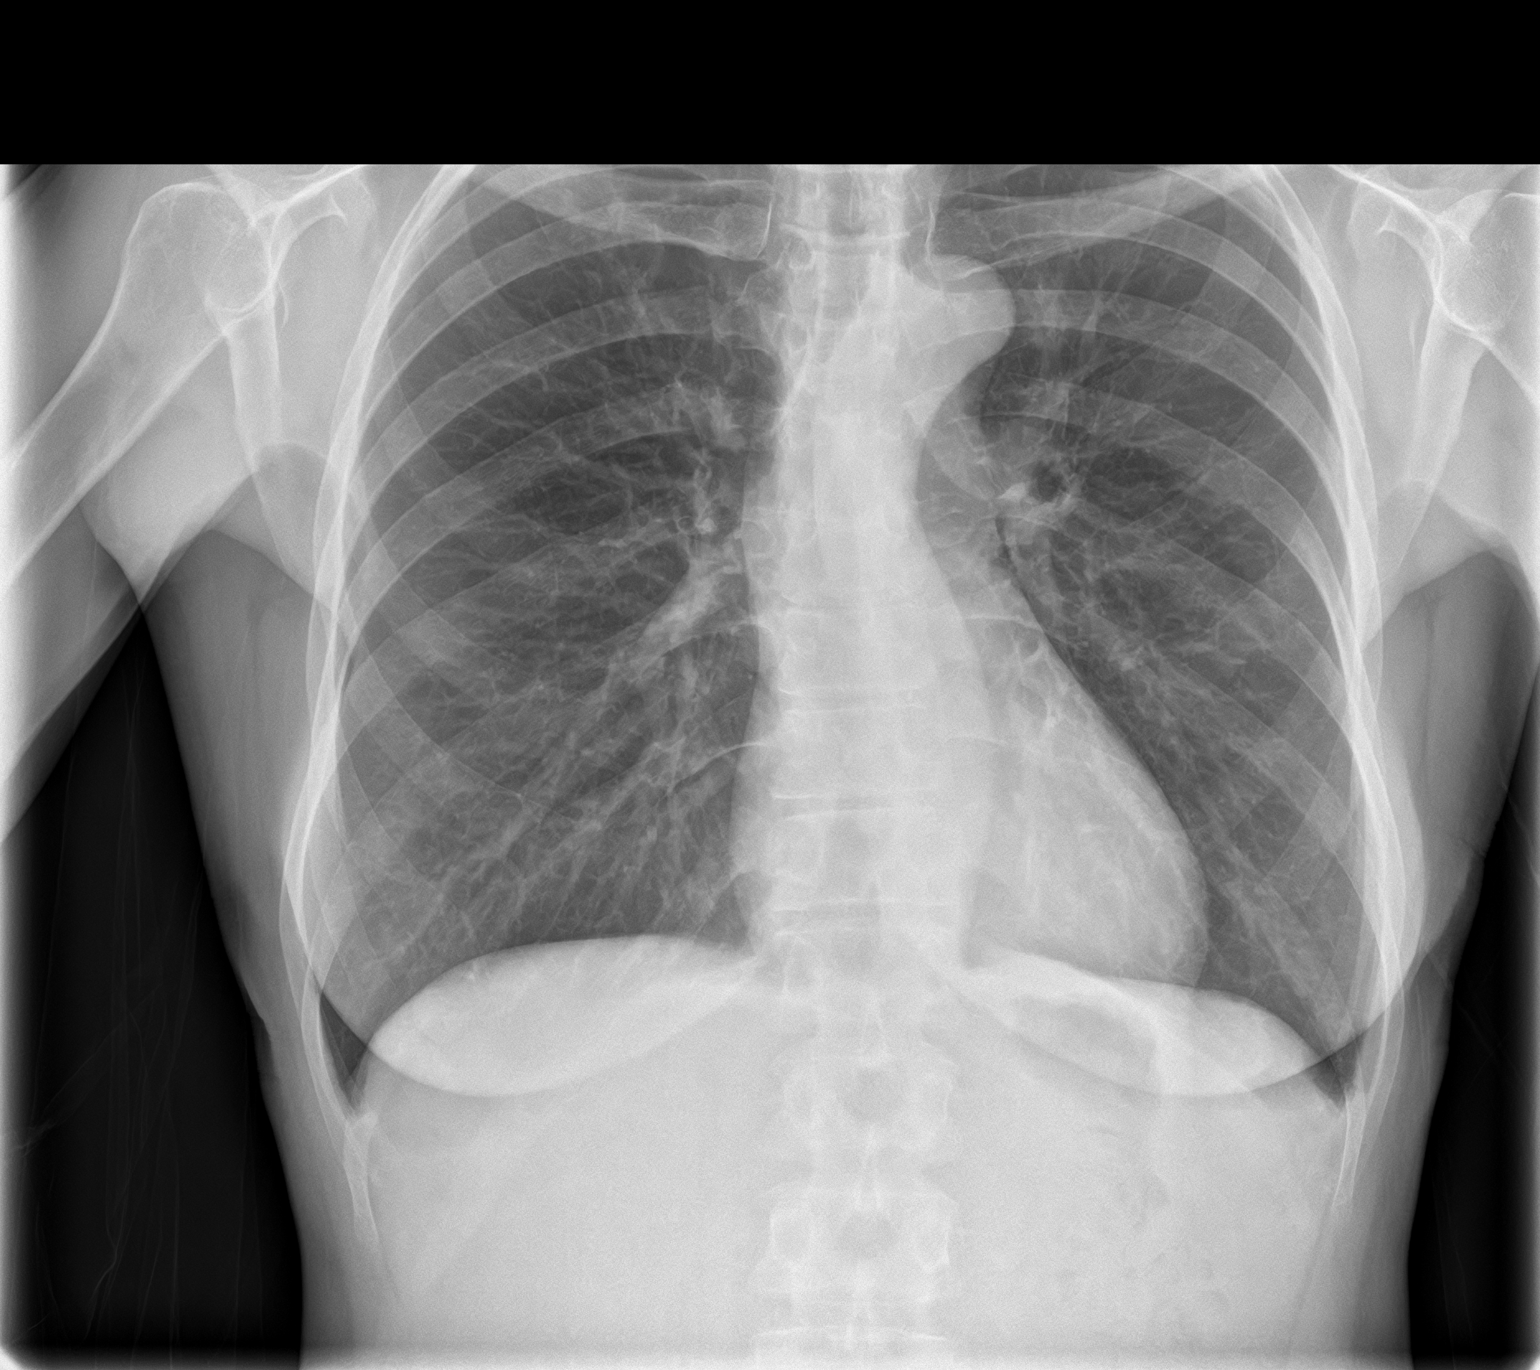
[im 2/2]
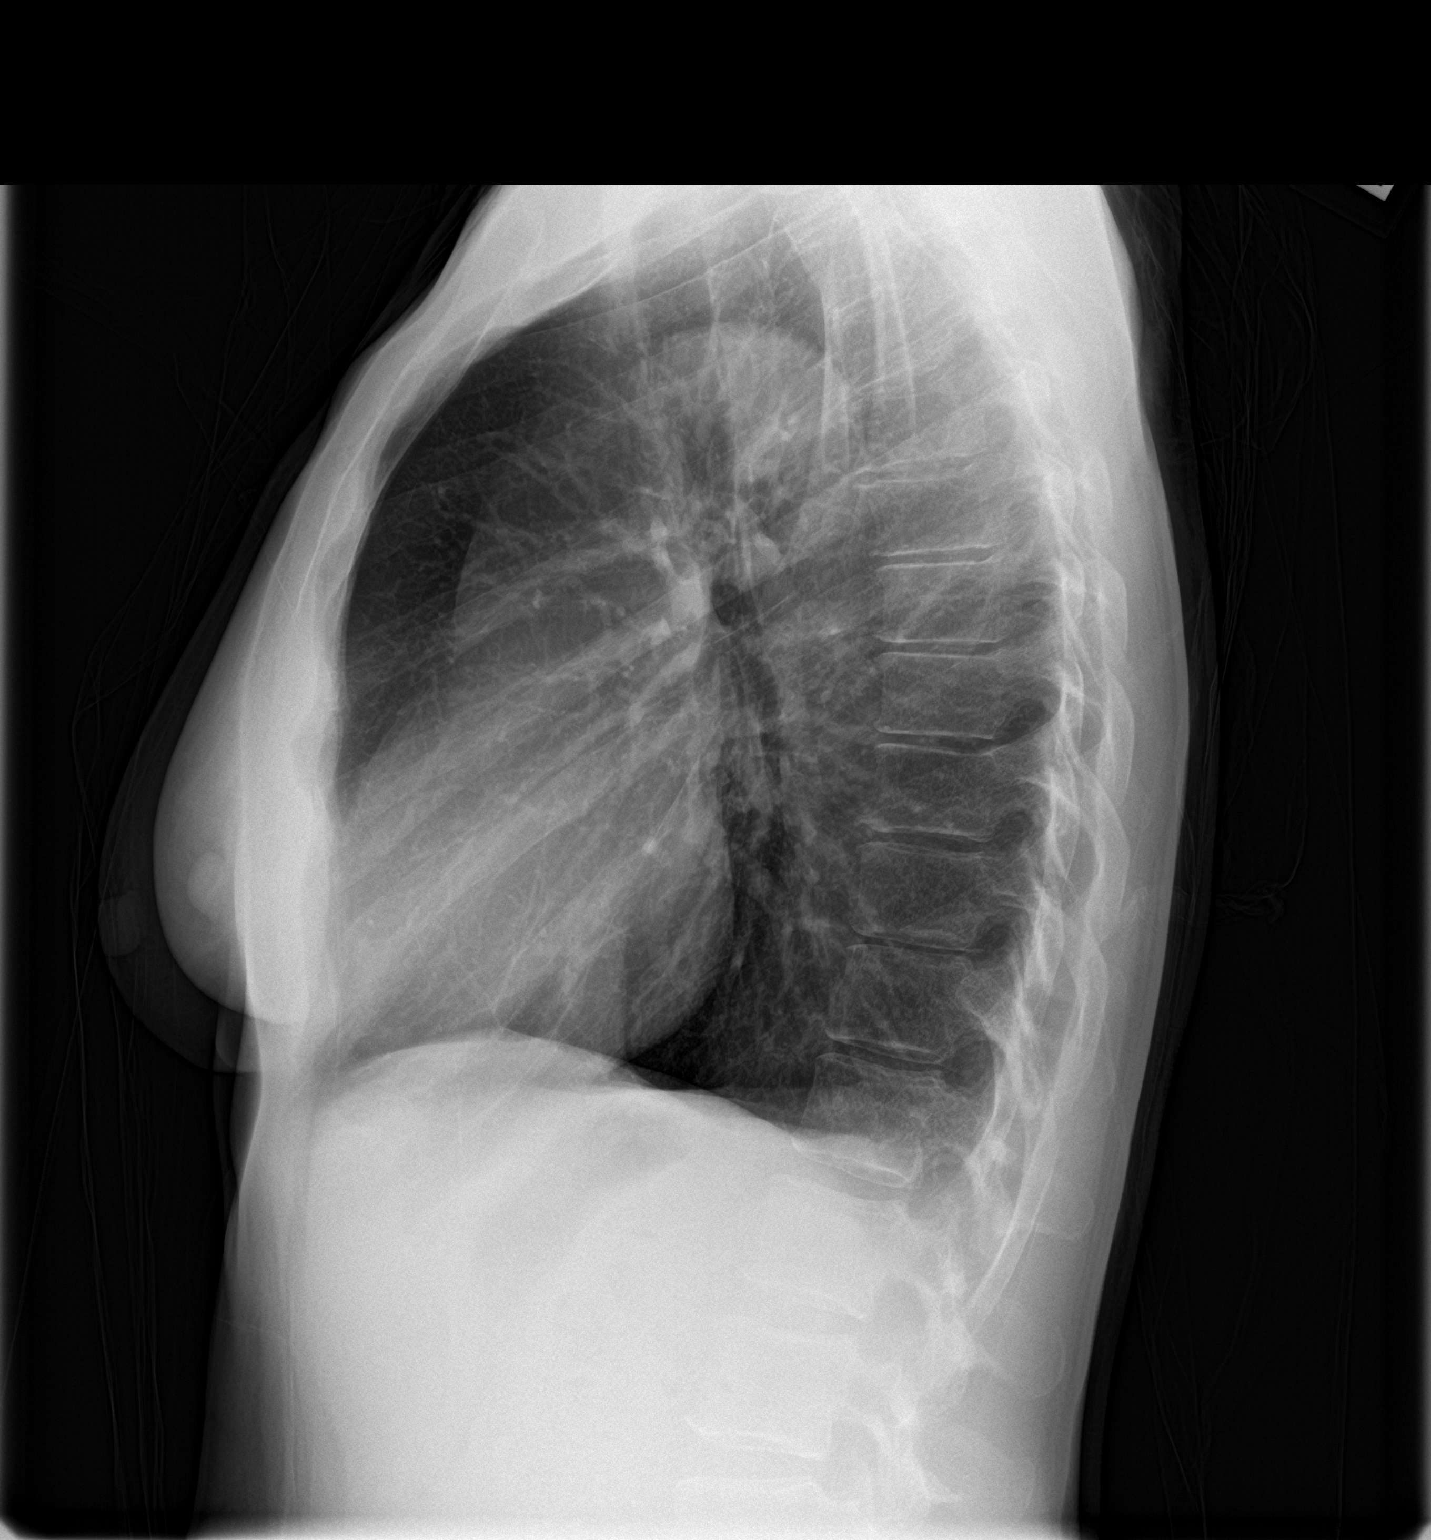

[2 of 2 positions shown; findings below may reference images not displayed]

FINDINGS: The cardiac silhouette, mediastinal and hilar contours are within
normal limits and stable. Moderate tortuosity of the thoracic aorta
is unchanged. The lungs are clear of acute process. No pleural
effusions or pneumothorax.
IMPRESSION: No acute cardiopulmonary findings.

## 2020-01-27 ENCOUNTER — Other Ambulatory Visit: Payer: Self-pay | Admitting: Women's Health

## 2020-01-27 DIAGNOSIS — Z1231 Encounter for screening mammogram for malignant neoplasm of breast: Secondary | ICD-10-CM

## 2020-02-27 ENCOUNTER — Telehealth: Payer: Self-pay

## 2020-02-27 ENCOUNTER — Other Ambulatory Visit: Payer: Self-pay

## 2020-02-27 ENCOUNTER — Encounter: Payer: Self-pay | Admitting: Family Medicine

## 2020-02-27 ENCOUNTER — Ambulatory Visit (INDEPENDENT_AMBULATORY_CARE_PROVIDER_SITE_OTHER): Payer: BC Managed Care – PPO | Admitting: Family Medicine

## 2020-02-27 VITALS — BP 130/76 | HR 53 | Temp 97.3°F | Ht 64.5 in | Wt 127.3 lb

## 2020-02-27 DIAGNOSIS — D696 Thrombocytopenia, unspecified: Secondary | ICD-10-CM | POA: Diagnosis not present

## 2020-02-27 DIAGNOSIS — F5101 Primary insomnia: Secondary | ICD-10-CM

## 2020-02-27 DIAGNOSIS — M79605 Pain in left leg: Secondary | ICD-10-CM | POA: Insufficient documentation

## 2020-02-27 LAB — CBC WITH DIFFERENTIAL/PLATELET
Basophils Absolute: 0 10*3/uL (ref 0.0–0.1)
Basophils Relative: 1.1 % (ref 0.0–3.0)
Eosinophils Absolute: 0.1 10*3/uL (ref 0.0–0.7)
Eosinophils Relative: 1.7 % (ref 0.0–5.0)
HCT: 40.9 % (ref 36.0–46.0)
Hemoglobin: 13.8 g/dL (ref 12.0–15.0)
Lymphocytes Relative: 32.6 % (ref 12.0–46.0)
Lymphs Abs: 1.3 10*3/uL (ref 0.7–4.0)
MCHC: 33.6 g/dL (ref 30.0–36.0)
MCV: 95.9 fl (ref 78.0–100.0)
Monocytes Absolute: 0.4 10*3/uL (ref 0.1–1.0)
Monocytes Relative: 9.8 % (ref 3.0–12.0)
Neutro Abs: 2.2 10*3/uL (ref 1.4–7.7)
Neutrophils Relative %: 54.8 % (ref 43.0–77.0)
Platelets: 128 10*3/uL — ABNORMAL LOW (ref 150.0–400.0)
RBC: 4.27 Mil/uL (ref 3.87–5.11)
RDW: 12.9 % (ref 11.5–15.5)
WBC: 4 10*3/uL (ref 4.0–10.5)

## 2020-02-27 LAB — D-DIMER, QUANTITATIVE: D-Dimer, Quant: 0.27 mcg/mL FEU (ref <0.50)

## 2020-02-27 NOTE — Assessment & Plan Note (Signed)
Pt has a tender area above patella and also upper inner thigh (no swelling/palp cord and Homans sign is negative  Her symptoms are mostly at rest  Diff includes dvt, muscle strain, sciatic nerve issues  She has h/o baseline low platelets as well  Stat cbc and DDimer ordered (would proceed with doppler if positive)  inst pt to try heat or bio freeze  Pending lab for further advisement

## 2020-02-27 NOTE — Assessment & Plan Note (Signed)
Pt asked for ambien refill/ does not take often Pt said she could wait until tomorrow for pcp to send

## 2020-02-27 NOTE — Progress Notes (Signed)
Subjective:    Patient ID: Darlene Spencer, female    DOB: 30-Aug-1961, 59 y.o.   MRN: [id number] This visit occurred during the SARS-CoV-2 public health emergency.  Safety protocols were in place, including screening questions prior to the visit, additional usage of staff PPE, and extensive cleaning of exam room while observing appropriate contact time as indicated for disinfecting solutions.    HPI Pt presents for L leg pain  59 yo pt of Dr Idelle Leech Readings from Last 3 Encounters:  02/27/20 127 lb 5 oz (57.7 kg)  03/31/19 129 lb 4 oz (58.6 kg)  02/02/18 120 lb (54.4 kg)   21.52 kg/m   Pain above knee (front) and also higher in inner thigh  Worse last night when the lay down  About 3 weeks but worse now   Throbbing dull pain  Not burning or stinging  No rash   Some tenderness to the touch in both  No redness and no warmth  No swelling  No lumps she can palpate  Has some spider veins  Feet do not bother her at all    She sits all day -at computer   Overall movement is better than staying still  Standing and walking around eases it   Wondered if it was sciatic nerve issue  This has happened a mo ago (started in L low back)   Lab Results  Component Value Date   WBC 3.6 (L) 03/31/2019   HGB 13.2 03/31/2019   HCT 38.7 03/31/2019   MCV 94.2 03/31/2019   PLT 129.0 (L) 03/31/2019    No personal hx of blood clot Does have baseline thrombocytopenia  She is a walker and physically fit   Patient Active Problem List   Diagnosis Date Noted  . Left leg pain 02/27/2020  . Primary insomnia 03/31/2019  . Family history of premature CAD 01/22/2018  . Abnormal CBC 03/11/2017  . Hyperglycemia 03/11/2017  . Leukopenia 07/01/2016  . Well woman exam (no gynecological exam) 04/05/2015  . Migraines 04/05/2015  . Thrombocytopenia ([place]) 04/05/2015  . Hyperlipidemia 07/04/2013  . Chest pain with moderate risk for cardiac etiology 05/09/2013   Past Medical History:    Diagnosis Date  . Endometriosis 2007   endometriotc cyst Rt.ovary  . Headache(784.0)   . Hyperlipidemia    on meds  . Osteopenia 08/2017   T score -1.6 FRAX 6.6% / 0.6%   Past Surgical History:  Procedure Laterality Date  . COLONOSCOPY    . OOPHORECTOMY  2007   BSO  . PELVIC LAPAROSCOPY  2007   BSO  . VAGINAL HYSTERECTOMY  1998   Social History   Tobacco Use  . Smoking status: Never Smoker  . Smokeless tobacco: Never Used  Substance Use Topics  . Alcohol use: Never  . Drug use: No   Family History  Problem Relation Age of Onset  . Hypertension Mother   . Heart disease Father   . Heart failure Father   . Heart attack Father 39  . Hypertension Maternal Grandfather   . Colon cancer Maternal Grandfather    No Known Allergies Current Outpatient Medications on File Prior to Visit  Medication Sig Dispense Refill  . aspirin EC 81 MG tablet Take 81 mg by mouth daily.    Marland Kitchen atorvastatin (LIPITOR) 10 MG tablet Take 1 tablet (10 mg total) by mouth daily. 90 tablet 3  . Calcium Carbonate-Vitamin D (TH CALCIUM CARBONATE-VITAMIN D) 600-400 MG-UNIT per tablet Take 1 tablet  by mouth daily at 6 PM.     . FIBER PO Take by mouth. Fiber Well Sugar -Free Gummies-Take 2 daily    . fish oil-omega-3 fatty acids 1000 MG capsule Take 1 g by mouth daily.      . Multiple Vitamin (MULTIVITAMIN) tablet Take 1 tablet by mouth. Women's MVI-Take 2 chewables daily    . topiramate (TOPAMAX) 100 MG tablet Take 100 mg by mouth daily at 6 PM.    . zolpidem (AMBIEN) 10 MG tablet TAKE 1 TABLET (10 MG TOTAL) BY MOUTH AT BEDTIME AS NEEDED. FOR SLEEP (Patient not taking: Reported on 02/27/2020) 30 tablet 0   Current Facility-Administered Medications on File Prior to Visit  Medication Dose Route Frequency Provider Last Rate Last Admin  . 0.9 %  sodium chloride infusion  500 mL Intravenous Continuous Gatha Mayer, MD         Review of Systems  Constitutional: Negative for activity change, appetite change,  fatigue, fever and unexpected weight change.  HENT: Negative for congestion, ear pain, rhinorrhea, sinus pressure and sore throat.   Eyes: Negative for pain, redness and visual disturbance.  Respiratory: Negative for cough, shortness of breath and wheezing.   Cardiovascular: Negative for chest pain and palpitations.       Mild spider veins in legs  Gastrointestinal: Negative for abdominal pain, blood in stool, constipation and diarrhea.  Endocrine: Negative for polydipsia and polyuria.  Genitourinary: Negative for dysuria, frequency and urgency.  Musculoskeletal: Negative for arthralgias, back pain and myalgias.       Left leg pain  Had back pain several weeks ago and it is now gone  Skin: Negative for pallor and rash.  Allergic/Immunologic: Negative for environmental allergies.  Neurological: Negative for dizziness, syncope and headaches.  Hematological: Negative for adenopathy. Does not bruise/bleed easily.  Psychiatric/Behavioral: Negative for decreased concentration and dysphoric mood. The patient is not nervous/anxious.        Objective:   Physical Exam Constitutional:      General: She is not in acute distress.    Appearance: Normal appearance. She is normal weight. She is not ill-appearing.  HENT:     Head: Normocephalic and atraumatic.  Eyes:     General: No scleral icterus.    Conjunctiva/sclera: Conjunctivae normal.     Pupils: Pupils are equal, round, and reactive to light.  Cardiovascular:     Rate and Rhythm: Regular rhythm. Bradycardia present.     Pulses: Normal pulses.     Heart sounds: Normal heart sounds.  Pulmonary:     Effort: Pulmonary effort is normal. No respiratory distress.     Breath sounds: Normal breath sounds. No stridor. No wheezing or rhonchi.  Abdominal:     General: Abdomen is flat. Bowel sounds are normal. There is no distension.     Palpations: Abdomen is soft.     Tenderness: There is no abdominal tenderness.  Musculoskeletal:         General: Tenderness present.     Cervical back: No rigidity.     Comments: Mild tenderness over quad (superior to knee) , and also over upper/inner thigh of L leg  No palp cords, warmth, swelling noted No difference in appearance of R and L leg  Nl perf and sensation Neg Homans test   No LS or lumbar muscular tenderness Neg SLR  Neurological:     Mental Status: She is alert.     Sensory: No sensory deficit.     Motor: No  weakness.     Coordination: Coordination normal.     Gait: Gait normal.     Deep Tendon Reflexes: Reflexes normal.  Psychiatric:        Mood and Affect: Mood normal.           Assessment & Plan:   Problem List Items Addressed This Visit      Other   Thrombocytopenia (HCC)    Pt presents with leg pain  No ecchymosis or petechiae noted  Has not had cbc for a while-this was added to labs      Relevant Orders   D-dimer, quantitative (not at [facility])   CBC with Differential/Platelet (Completed)   Primary insomnia    Pt asked for ambien refill/ does not take often Pt said she could wait until tomorrow for pcp to send      Left leg pain - Primary    Pt has a tender area above patella and also upper inner thigh (no swelling/palp cord and Homans sign is negative  Her symptoms are mostly at rest  Diff includes dvt, muscle strain, sciatic nerve issues  She has h/o baseline low platelets as well  Stat cbc and DDimer ordered (would proceed with doppler if positive)  inst pt to try heat or bio freeze  Pending lab for further advisement       Relevant Orders   D-dimer, quantitative (not at [facility])   CBC with Differential/Platelet (Completed)

## 2020-02-27 NOTE — Patient Instructions (Addendum)
Try some heat on the areas that are sore  Also you can try biofreeze   Tylenol is ok for pain   Watch out for any redness or swelling   Labs now for cbc as well as DDimer (screening test for blood clots)   Please alert [place] if symptoms suddenly worsen or change

## 2020-02-27 NOTE — Assessment & Plan Note (Signed)
Pt presents with leg pain  No ecchymosis or petechiae noted  Has not had cbc for a while-this was added to labs

## 2020-02-27 NOTE — Telephone Encounter (Signed)
Will see her then 

## 2020-02-27 NOTE — Telephone Encounter (Signed)
Pt has dull throbbing pain in thigh of lt leg;pain started one month ago; last night the pain intensified and pt was not able to sleep; pain level now is 4 while standing but if pt lays down pain level is 8; pain is better when standing and when laying the pain is worse. When  Pt first stands up feels like lt leg is going to give way on her but after standing for few moments leg is OK. Initially one month ago when pain first started had low back pain on lt and pain went down lt leg; for last 3 wks no back pain but upper lt leg pain only. No pain below knee. No redness, warmth or swelling in lt leg. Pt is not have problems in rt leg. No CP,SOB,H/A or dizziness. Pt has no covid symptoms, no travel and no known exposure to + covid. Pt scheduled 30' in office appt with Dr Glori Bickers  02/27/19 at [facility] to Dr Glori Bickers.

## 2020-03-05 ENCOUNTER — Telehealth: Payer: Self-pay | Admitting: Family Medicine

## 2020-03-05 ENCOUNTER — Other Ambulatory Visit: Payer: Self-pay | Admitting: Family Medicine

## 2020-03-05 ENCOUNTER — Encounter: Payer: Self-pay | Admitting: Family Medicine

## 2020-03-05 DIAGNOSIS — F5101 Primary insomnia: Secondary | ICD-10-CM

## 2020-03-05 MED ORDER — ZOLPIDEM TARTRATE 10 MG PO TABS: 10.0000 mg | ORAL_TABLET | Freq: Every evening | ORAL | 0 refills | Status: DC | PRN

## 2020-03-05 NOTE — Telephone Encounter (Signed)
[facility] Night - Client TELEPHONE ADVICE RECORD [facility] Patient Name: Darlene Spencer Gender: Female DOB: [birth year] Age: 59 Y 57 M 30 D Return Phone Number: [phone] (Primary), [phone] (Secondary) [place] [address] Client [facility] Night - Client Client Site [place] Physician Waunita Schooner- MD Contact Type Call Who Is Calling Patient / Member / Family / Caregiver Call Type Triage / Clinical Relationship To Patient Self Return Phone Number [phone] (Primary) Chief Complaint Leg Pain Reason for Call Request to Schedule Office Appointment Initial Comment Caller states that she was in the office last week and she was advised that if her leg is still hurting to call. She said that it is still hurting. Translation No Nurse Assessment Nurse: Olevia Bowens, RN, Lauren Date/Time Eilene Ghazi Time): 03/05/2020 8:12:06 AM Confirm and document reason for call. If symptomatic, describe symptoms. ---Caller states that she was in the office early last week for leg pain and saw Dr. Alba Cory. Has been doing stretches and was told to contact office if it was still hurting after a week. Left leg pain is rated, mild with sitting but when standing up sharp shooting pain, then as she walks it starts to feel okay. With continued movement pain is 3/10, sitting still 5/10, standing up from sitting with weight bearing 8/10. Denies swelling, no lumps, no fever. Has the patient had close contact with a person known or suspected to have the novel coronavirus illness OR traveled / lives in area with major community spread (including international travel) in the last 14 days from the onset of symptoms? * If Asymptomatic, screen for exposure and travel within the last 14 days. ---No Does the patient have any new or worsening symptoms? ---No Please document clinical information provided and list  any resource used. ---Caller states she has already been seen in the office for this and doesn't need to be seen again but is calling to let the doctor know it is still hurting and wondering if she would like to order imaging as mentioned at the last appt. Guidelines Guideline Title Affirmed Question Affirmed Notes Nurse Date/Time (Eastern Time) Disp. Time Eilene Ghazi Time) Disposition Final User 03/05/2020 8:23:04 AM Clinical Call Yes Olevia Bowens, RN, Lauren PLEASE NOTE: All timestamps contained within this report are represented as [place] Standard Time. CONFIDENTIALTY NOTICE: This fax transmission is intended only for the addressee. It contains information that is legally privileged, confidential or otherwise protected from use or disclosure. If you are not the intended recipient, you are strictly prohibited from reviewing, disclosing, copying using or disseminating any of this information or taking any action in reliance on or regarding this information. If you have received this fax in error, please notify us immediately by telephone so that we can arrange for its return to [place]. Phone: [phone], Toll-Free: [phone], Fax: [phone] Page: 2 of 2 Call Id:

## 2020-03-05 NOTE — Telephone Encounter (Signed)
Agree with Dr. Lorelei Pont evaluation. Appreciate the update

## 2020-03-05 NOTE — Telephone Encounter (Signed)
I would like her to see Dr Lorelei Pont for further evaluation (sport med), please schedule  Let me know if any changes in her symptoms  Will cc to pcp as well

## 2020-03-05 NOTE — Telephone Encounter (Signed)
Sent over the pharmacy. Will plan contract for next visit.

## 2020-03-05 NOTE — Telephone Encounter (Signed)
Pt calling back, still having pain in left leg. No change.  No discoloration to skin, swelling. Denies SOB.   Pt states that she was advised to call back if pain still present in 1 week.   Please advise, thanks.

## 2020-03-05 NOTE — Telephone Encounter (Signed)
Last filled on 10/24/2019 #30 with 0 refill  LOV 02/27/20 for leg pain with Dr Glori Bickers. LOV with Dr Einar Pheasant was on 03/31/2019 for Transfer of Care  No future appointments on file. No UDS or Contract on file.

## 2020-03-05 NOTE — Telephone Encounter (Signed)
Requesting a refill of generic Ambien  [facility]

## 2020-03-06 NOTE — Telephone Encounter (Signed)
Agree with plan to see sports medicine.

## 2020-03-12 ENCOUNTER — Encounter: Payer: Self-pay | Admitting: Family Medicine

## 2020-03-12 ENCOUNTER — Ambulatory Visit (INDEPENDENT_AMBULATORY_CARE_PROVIDER_SITE_OTHER): Payer: BC Managed Care – PPO | Admitting: Family Medicine

## 2020-03-12 ENCOUNTER — Other Ambulatory Visit: Payer: Self-pay

## 2020-03-12 ENCOUNTER — Telehealth: Payer: Self-pay

## 2020-03-12 VITALS — BP 122/84 | HR 78 | Temp 98.6°F | Resp 16 | Wt 128.5 lb

## 2020-03-12 DIAGNOSIS — R1032 Left lower quadrant pain: Secondary | ICD-10-CM | POA: Diagnosis not present

## 2020-03-12 DIAGNOSIS — E782 Mixed hyperlipidemia: Secondary | ICD-10-CM

## 2020-03-12 DIAGNOSIS — R739 Hyperglycemia, unspecified: Secondary | ICD-10-CM | POA: Diagnosis not present

## 2020-03-12 LAB — LIPID PANEL
Cholesterol: 165 mg/dL (ref 0–200)
HDL: 68 mg/dL (ref >39.00)
LDL Cholesterol: 86 mg/dL (ref 0–99)
NonHDL: 97.04
Total CHOL/HDL Ratio: 2
Triglycerides: 55 mg/dL (ref 0.0–149.0)
VLDL: 11 mg/dL (ref 0.0–40.0)

## 2020-03-12 LAB — COMPREHENSIVE METABOLIC PANEL
ALT: 14 U/L (ref 0–35)
AST: 18 U/L (ref 0–37)
Albumin: 4.5 g/dL (ref 3.5–5.2)
Alkaline Phosphatase: 66 U/L (ref 39–117)
BUN: 15 mg/dL (ref 6–23)
CO2: 26 mEq/L (ref 19–32)
Calcium: 9.3 mg/dL (ref 8.4–10.5)
Chloride: 107 mEq/L (ref 96–112)
Creatinine, Ser: 0.84 mg/dL (ref 0.40–1.20)
GFR: 69.08 mL/min (ref >60.00)
Glucose, Bld: 98 mg/dL (ref 70–99)
Potassium: 3.7 mEq/L (ref 3.5–5.1)
Sodium: 140 mEq/L (ref 135–145)
Total Bilirubin: 0.7 mg/dL (ref 0.2–1.2)
Total Protein: 7 g/dL (ref 6.0–8.3)

## 2020-03-12 NOTE — Progress Notes (Signed)
Subjective:     Darlene Spencer is a 59 y.o. female presenting for Abdominal Pain (left side pain started around early April. pain radiates to the left side of the back. Legt leg tignling sensation.)     HPI  #Leg/hip pan - started in the pelvic area and the hip/low back - but then settled in the leg and remained there   Friday afternoon - had acute pain in the hip/groin area - very sore in that area - radiated to the back - worse with getting out of the car, bending, moving quickly - improved with - walking and moving  - position: bending worse - severe pain x 30 minutes - was home, resting when it came - notes vaginal discharge x 1 - this resolved - treatment: tylenol w/o improvement - has not tried    Review of Systems  Gastrointestinal: Positive for nausea. Negative for abdominal pain, blood in stool, constipation, diarrhea and vomiting.     Social History   Tobacco Use  Smoking Status Never Smoker  Smokeless Tobacco Never Used        Objective:    BP Readings from Last 3 Encounters:  03/12/20 122/84  02/27/20 130/76  03/31/19 112/84   Wt Readings from Last 3 Encounters:  03/12/20 128 lb 8 oz (58.3 kg)  02/27/20 127 lb 5 oz (57.7 kg)  03/31/19 129 lb 4 oz (58.6 kg)    BP 122/84   Pulse 78   Temp 98.6 F (37 C)   Resp 16   Wt 128 lb 8 oz (58.3 kg)   LMP 11/10/1996   BMI 21.72 kg/m    Physical Exam Constitutional:      General: She is not in acute distress.    Appearance: She is well-developed. She is not diaphoretic.  HENT:     Right Ear: External ear normal.     Left Ear: External ear normal.     Nose: Nose normal.  Eyes:     Conjunctiva/sclera: Conjunctivae normal.  Cardiovascular:     Rate and Rhythm: Normal rate.  Pulmonary:     Effort: Pulmonary effort is normal.  Abdominal:     General: Abdomen is flat. Bowel sounds are normal.     Palpations: Abdomen is soft.     Tenderness: There is no abdominal tenderness. There is no  right CVA tenderness or left CVA tenderness.  Musculoskeletal:     Cervical back: Neck supple.     Comments: Left Hip Inspection: no erythema Palpation: TTP just anterior and medial to the anterior superior iliac spine area ROM: normal w/o pain worsening Strength: normal - hip flexor, abductor  Skin:    General: Skin is warm and dry.     Capillary Refill: Capillary refill takes less than 2 seconds.  Neurological:     Mental Status: She is alert. Mental status is at baseline.  Psychiatric:        Mood and Affect: Mood normal.        Behavior: Behavior normal.           Assessment & Plan:   Problem List Items Addressed This Visit      Other   Hyperlipidemia (Chronic)    Cont lipitor, repeat labs to assess       Relevant Orders   Lipid panel   Hyperglycemia    Borderline in the past. Check labs today      Relevant Orders   Comprehensive metabolic panel   Left groin  pain - Primary    Was seen a few weeks ago with hip/groin/leg pain on the left side. Returns again today due to persistent symptoms as well as severe episode. Reassurance that based on exam low concern for abdominal pathology -- seems to tract along the muscle from the anterior iliac spine, however, hip range of motion normal w/o pain so unclear the source. Pt not sure if she still has ovaries and hx of endometriosis so discussed that we could get pelvic US to rule out pathology and this was ordered. Encouraged PT, she will consider. Offered sports medicine referral and she will consider. If persisting will recommend either sports medicine or PT.       Relevant Orders   US Pelvic Complete With Transvaginal       Return if symptoms worsen or fail to improve, for annual physical exam.  Lesleigh Noe, MD

## 2020-03-12 NOTE — Assessment & Plan Note (Signed)
Was seen a few weeks ago with hip/groin/leg pain on the left side. Returns again today due to persistent symptoms as well as severe episode. Reassurance that based on exam low concern for abdominal pathology -- seems to tract along the muscle from the anterior iliac spine, however, hip range of motion normal w/o pain so unclear the source. Pt not sure if she still has ovaries and hx of endometriosis so discussed that we could get pelvic US to rule out pathology and this was ordered. Encouraged PT, she will consider. Offered sports medicine referral and she will consider. If persisting will recommend either sports medicine or PT.

## 2020-03-12 NOTE — Patient Instructions (Signed)
Pelvic ultrasound  Then would recommend either follow-up with Dr. Lorelei Pont or Physical therapy for hip/groin pain.    Can try ibuprofen 600 mg every 8 hours for 2-3 days, then just as needed

## 2020-03-12 NOTE — Telephone Encounter (Signed)
See encounter note.

## 2020-03-12 NOTE — Assessment & Plan Note (Signed)
Borderline in the past. Check labs today

## 2020-03-12 NOTE — Assessment & Plan Note (Signed)
Cont lipitor, repeat labs to assess

## 2020-03-12 NOTE — Telephone Encounter (Signed)
[facility] - Client TELEPHONE ADVICE RECORD [facility] Patient Name: Darlene Spencer Gender: Female DOB: 10/15/1961 Age: 59 Y 61 M 6 D Return Phone Number: [phone] (Primary), [phone] (Secondary) Address: [place]/State/Zip: [place] [address] Client [facility] - Client Client Site [facility] - Day Physician Waunita Schooner- MD Contact Type Call Who Is Calling Patient / Member / Family / Caregiver Call Type Triage / Clinical Relationship To Patient Self Return Phone Number [phone] (Primary) Chief Complaint Abdominal Pain Reason for Call Symptomatic / Request for Health Information Initial Comment Caller states she has Left side abdominal pain. Translation No Nurse Assessment Nurse: Markus Daft, RN, Sherre Poot Date/Time (Eastern Time): 03/12/2020 8:50:04 AM Confirm and document reason for call. If symptomatic, describe symptoms. ---Caller c/o left side lower abdominal pain with nausea since Friday afternoon. Severe pain for 30 min. Constant. -- Seen on 4/19 with mild case of it. Has the patient had close contact with a person known or suspected to have the novel coronavirus illness OR traveled / lives in area with major community spread (including international travel) in the last 14 days from the onset of symptoms? * If Asymptomatic, screen for exposure and travel within the last 14 days. ---No Does the patient have any new or worsening symptoms? ---Yes Will a triage be completed? ---Yes Related visit to physician within the last 2 weeks? ---No Does the PT have any chronic conditions? (i.e. diabetes, asthma, this includes High risk factors for pregnancy, etc.) ---No Is this a behavioral health or substance abuse call? ---No Guidelines Guideline Title Affirmed Question Affirmed Notes Nurse Date/Time Eilene Ghazi Time) Abdominal Pain - Female [1] MILD-MODERATE pain AND [2] constant AND  [3] present > 2 hours Markus Daft, RN, Windy 03/12/2020 8:53:06 AM Disp. Time Eilene Ghazi Time) Disposition Final User 03/12/2020 8:54:43 AM See HCP within 4 Hours (or PCP triage) Yes Markus Daft, RN, WindyPLEASE NOTE: All timestamps contained within this report are represented as [place] Standard Time. CONFIDENTIALTY NOTICE: This fax transmission is intended only for the addressee. It contains information that is legally privileged, confidential or otherwise protected from use or disclosure. If you are not the intended recipient, you are strictly prohibited from reviewing, disclosing, copying using or disseminating any of this information or taking any action in reliance on or regarding this information. If you have received this fax in error, please notify us immediately by telephone so that we can arrange for its return to [place]. Phone: [phone], Toll-Free: [phone], Fax: [phone] Page: 2 of 2 Call Id: [id number] [place] Disagree/Comply Comply Caller Understands Yes PreDisposition Go to Urgent Care/Walk-In Clinic Care Advice Given Per Guideline SEE HCP WITHIN 4 HOURS (OR PCP TRIAGE): * IF OFFICE WILL BE OPEN: You need to be seen within the next 3 or 4 hours. Call your doctor (or NP/PA) now or as soon as the office opens. NOTHING BY MOUTH: * Do not eat or drink anything for now. REST: * Lie down and rest. * Do this until seen. CALL BACK IF: * You become worse. CARE ADVICE given per Abdominal Pain, Female (Adult) guideline. Comments User: Mayford Knife, RN Date/Time Eilene Ghazi Time): 03/12/2020 8:54:31 AM She has appt at 12 pm. Referrals REFERRED TO PCP OFFICE

## 2020-03-16 ENCOUNTER — Ambulatory Visit
Admission: RE | Admit: 2020-03-16 | Discharge: 2020-03-16 | Disposition: A | Discharge status: Home / Self Care | Payer: BC Managed Care – PPO | Source: Ambulatory Visit | Attending: Family Medicine | Admitting: Family Medicine

## 2020-03-16 DIAGNOSIS — R1032 Left lower quadrant pain: Secondary | ICD-10-CM

## 2020-03-16 DIAGNOSIS — R102 Pelvic and perineal pain: Secondary | ICD-10-CM | POA: Diagnosis not present

## 2020-04-12 ENCOUNTER — Ambulatory Visit: Payer: BLUE CROSS/BLUE SHIELD

## 2020-04-16 ENCOUNTER — Other Ambulatory Visit: Payer: Self-pay | Admitting: Family Medicine

## 2020-04-16 DIAGNOSIS — E782 Mixed hyperlipidemia: Secondary | ICD-10-CM

## 2020-04-19 ENCOUNTER — Other Ambulatory Visit: Payer: Self-pay

## 2020-04-19 ENCOUNTER — Ambulatory Visit
Admission: RE | Admit: 2020-04-19 | Discharge: 2020-04-19 | Disposition: A | Discharge status: Home / Self Care | Payer: BC Managed Care – PPO | Source: Ambulatory Visit | Attending: Women's Health | Admitting: Women's Health

## 2020-04-19 DIAGNOSIS — Z1231 Encounter for screening mammogram for malignant neoplasm of breast: Secondary | ICD-10-CM

## 2020-04-25 DIAGNOSIS — G43019 Migraine without aura, intractable, without status migrainosus: Secondary | ICD-10-CM | POA: Diagnosis not present

## 2020-04-25 DIAGNOSIS — G43719 Chronic migraine without aura, intractable, without status migrainosus: Secondary | ICD-10-CM | POA: Diagnosis not present

## 2020-05-11 ENCOUNTER — Other Ambulatory Visit: Payer: Self-pay | Admitting: Family Medicine

## 2020-05-11 DIAGNOSIS — E782 Mixed hyperlipidemia: Secondary | ICD-10-CM

## 2020-06-17 DIAGNOSIS — Z13220 Encounter for screening for lipoid disorders: Secondary | ICD-10-CM | POA: Diagnosis not present

## 2020-06-17 DIAGNOSIS — Z131 Encounter for screening for diabetes mellitus: Secondary | ICD-10-CM | POA: Diagnosis not present

## 2020-06-17 DIAGNOSIS — Z0130 Encounter for examination of blood pressure without abnormal findings: Secondary | ICD-10-CM | POA: Diagnosis not present

## 2020-06-17 DIAGNOSIS — Z136 Encounter for screening for cardiovascular disorders: Secondary | ICD-10-CM | POA: Diagnosis not present

## 2020-06-17 DIAGNOSIS — Z713 Dietary counseling and surveillance: Secondary | ICD-10-CM | POA: Diagnosis not present

## 2020-06-17 LAB — LIPID PANEL
Cholesterol: 171 (ref 0–200)
HDL: 73 — AB (ref 35–70)
LDL Cholesterol: 88
Triglycerides: 52 (ref 40–160)

## 2020-06-17 LAB — BASIC METABOLIC PANEL: Glucose: 78

## 2020-06-27 ENCOUNTER — Encounter: Payer: Self-pay | Admitting: Family Medicine

## 2020-06-29 DIAGNOSIS — Z20822 Contact with and (suspected) exposure to covid-19: Secondary | ICD-10-CM | POA: Diagnosis not present

## 2020-06-29 DIAGNOSIS — Z03818 Encounter for observation for suspected exposure to other biological agents ruled out: Secondary | ICD-10-CM | POA: Diagnosis not present

## 2020-07-06 ENCOUNTER — Other Ambulatory Visit: Payer: Self-pay | Admitting: Otolaryngology

## 2020-07-06 DIAGNOSIS — R070 Pain in throat: Secondary | ICD-10-CM | POA: Diagnosis not present

## 2020-07-06 DIAGNOSIS — R221 Localized swelling, mass and lump, neck: Secondary | ICD-10-CM

## 2020-07-18 ENCOUNTER — Other Ambulatory Visit: Payer: Self-pay

## 2020-07-18 ENCOUNTER — Ambulatory Visit
Admission: RE | Admit: 2020-07-18 | Discharge: 2020-07-18 | Disposition: A | Discharge status: Home / Self Care | Payer: BC Managed Care – PPO | Source: Ambulatory Visit | Attending: Otolaryngology | Admitting: Otolaryngology

## 2020-07-18 DIAGNOSIS — R590 Localized enlarged lymph nodes: Secondary | ICD-10-CM | POA: Diagnosis not present

## 2020-07-18 DIAGNOSIS — R1310 Dysphagia, unspecified: Secondary | ICD-10-CM | POA: Diagnosis not present

## 2020-07-18 DIAGNOSIS — R221 Localized swelling, mass and lump, neck: Secondary | ICD-10-CM | POA: Diagnosis not present

## 2020-07-18 DIAGNOSIS — E042 Nontoxic multinodular goiter: Secondary | ICD-10-CM | POA: Diagnosis not present

## 2020-07-18 DIAGNOSIS — R490 Dysphonia: Secondary | ICD-10-CM | POA: Diagnosis not present

## 2020-07-18 LAB — POCT I-STAT CREATININE: Creatinine, Ser: 0.9 mg/dL (ref 0.44–1.00)

## 2020-07-18 MED ORDER — IOHEXOL 300 MG/ML  SOLN
75.0000 mL | Freq: Once | INTRAMUSCULAR | Status: AC | PRN
  Administered 2020-07-18: 75 mL via INTRAVENOUS

## 2020-08-22 DIAGNOSIS — Z20822 Contact with and (suspected) exposure to covid-19: Secondary | ICD-10-CM | POA: Diagnosis not present

## 2020-09-24 DIAGNOSIS — Z20822 Contact with and (suspected) exposure to covid-19: Secondary | ICD-10-CM | POA: Diagnosis not present

## 2020-09-26 DIAGNOSIS — Z20822 Contact with and (suspected) exposure to covid-19: Secondary | ICD-10-CM | POA: Diagnosis not present

## 2020-10-29 DIAGNOSIS — G43019 Migraine without aura, intractable, without status migrainosus: Secondary | ICD-10-CM | POA: Diagnosis not present

## 2020-10-29 DIAGNOSIS — G43719 Chronic migraine without aura, intractable, without status migrainosus: Secondary | ICD-10-CM | POA: Diagnosis not present

## 2020-11-29 DIAGNOSIS — Z20822 Contact with and (suspected) exposure to covid-19: Secondary | ICD-10-CM | POA: Diagnosis not present

## 2021-01-07 DIAGNOSIS — M25512 Pain in left shoulder: Secondary | ICD-10-CM | POA: Diagnosis not present

## 2021-01-21 ENCOUNTER — Other Ambulatory Visit: Payer: Self-pay

## 2021-01-21 ENCOUNTER — Ambulatory Visit (INDEPENDENT_AMBULATORY_CARE_PROVIDER_SITE_OTHER): Payer: BC Managed Care – PPO | Admitting: Family Medicine

## 2021-01-21 ENCOUNTER — Other Ambulatory Visit (HOSPITAL_COMMUNITY)
Admission: RE | Admit: 2021-01-21 | Discharge: 2021-01-21 | Disposition: A | Discharge status: Home / Self Care | Payer: BC Managed Care – PPO | Source: Ambulatory Visit | Attending: Family Medicine | Admitting: Family Medicine

## 2021-01-21 VITALS — BP 102/78 | HR 63 | Temp 98.0°F | Ht 63.5 in | Wt 119.0 lb

## 2021-01-21 DIAGNOSIS — N644 Mastodynia: Secondary | ICD-10-CM | POA: Diagnosis not present

## 2021-01-21 DIAGNOSIS — Z0000 Encounter for general adult medical examination without abnormal findings: Secondary | ICD-10-CM | POA: Diagnosis not present

## 2021-01-21 DIAGNOSIS — Z124 Encounter for screening for malignant neoplasm of cervix: Secondary | ICD-10-CM | POA: Insufficient documentation

## 2021-01-21 DIAGNOSIS — F5101 Primary insomnia: Secondary | ICD-10-CM

## 2021-01-21 MED ORDER — ZOLPIDEM TARTRATE 10 MG PO TABS: 10.0000 mg | ORAL_TABLET | Freq: Every evening | ORAL | 0 refills | Status: DC | PRN

## 2021-01-21 NOTE — Patient Instructions (Addendum)
Breast pain - try voltaren gel - call if worsening   Send blood work via Mychart  Preventive Care 35-62 Years Old, Female Preventive care refers to lifestyle choices and visits with your health care provider that can promote health and wellness. This includes:  A yearly physical exam. This is also called an annual wellness visit.  Regular dental and eye exams.  Immunizations.  Screening for certain conditions.  Healthy lifestyle choices, such as: ? Eating a healthy diet. ? Getting regular exercise. ? Not using drugs or products that contain nicotine and tobacco. ? Limiting alcohol use. What can I expect for my preventive care visit? Physical exam Your health care provider will check your:  Height and weight. These may be used to calculate your BMI (body mass index). BMI is a measurement that tells if you are at a healthy weight.  Heart rate and blood pressure.  Body temperature.  Skin for abnormal spots. Counseling Your health care provider may ask you questions about your:  Past medical problems.  Family's medical history.  Alcohol, tobacco, and drug use.  Emotional well-being.  Home life and relationship well-being.  Sexual activity.  Diet, exercise, and sleep habits.  Work and work Statistician.  Access to firearms.  Method of birth control.  Menstrual cycle.  Pregnancy history. What immunizations do I need? Vaccines are usually given at various ages, according to a schedule. Your health care provider will recommend vaccines for you based on your age, medical history, and lifestyle or other factors, such as travel or where you work.   What tests do I need? Blood tests  Lipid and cholesterol levels. These may be checked every 5 years, or more often if you are over 31 years old.  Hepatitis C test.  Hepatitis B test. Screening  Lung cancer screening. You may have this screening every year starting at age 79 if you have a 30-pack-year history of  smoking and currently smoke or have quit within the past 15 years.  Colorectal cancer screening. ? All adults should have this screening starting at age 50 and continuing until age 62. ? Your health care provider may recommend screening at age 39 if you are at increased risk. ? You will have tests every 1-10 years, depending on your results and the type of screening test.  Diabetes screening. ? This is done by checking your blood sugar (glucose) after you have not eaten for a while (fasting). ? You may have this done every 1-3 years.  Mammogram. ? This may be done every 1-2 years. ? Talk with your health care provider about when you should start having regular mammograms. This may depend on whether you have a family history of breast cancer.  BRCA-related cancer screening. This may be done if you have a family history of breast, ovarian, tubal, or peritoneal cancers.  Pelvic exam and Pap test. ? This may be done every 3 years starting at age 50. ? Starting at age 79, this may be done every 5 years if you have a Pap test in combination with an HPV test. Other tests  STD (sexually transmitted disease) testing, if you are at risk.  Bone density scan. This is done to screen for osteoporosis. You may have this scan if you are at high risk for osteoporosis. Talk with your health care provider about your test results, treatment options, and if necessary, the need for more tests. Follow these instructions at home: Eating and drinking  Eat a diet that includes  fresh fruits and vegetables, whole grains, lean protein, and low-fat dairy products.  Take vitamin and mineral supplements as recommended by your health care provider.  Do not drink alcohol if: ? Your health care provider tells you not to drink. ? You are pregnant, may be pregnant, or are planning to become pregnant.  If you drink alcohol: ? Limit how much you have to 0-1 drink a day. ? Be aware of how much alcohol is in your  drink. In the [place] one drink equals one 12 oz bottle of beer (355 mL), one 5 oz glass of wine (148 mL), or one 1 oz glass of hard liquor (44 mL).   Lifestyle  Take daily care of your teeth and gums. Brush your teeth every morning and night with fluoride toothpaste. Floss one time each day.  Stay active. Exercise for at least 30 minutes 5 or more days each week.  Do not use any products that contain nicotine or tobacco, such as cigarettes, e-cigarettes, and chewing tobacco. If you need help quitting, ask your health care provider.  Do not use drugs.  If you are sexually active, practice safe sex. Use a condom or other form of protection to prevent STIs (sexually transmitted infections).  If you do not wish to become pregnant, use a form of birth control. If you plan to become pregnant, see your health care provider for a prepregnancy visit.  If told by your health care provider, take low-dose aspirin daily starting at age 9.  Find healthy ways to cope with stress, such as: ? Meditation, yoga, or listening to music. ? Journaling. ? Talking to a trusted person. ? Spending time with friends and family. Safety  Always wear your seat belt while driving or riding in a vehicle.  Do not drive: ? If you have been drinking alcohol. Do not ride with someone who has been drinking. ? When you are tired or distracted. ? While texting.  Wear a helmet and other protective equipment during sports activities.  If you have firearms in your house, make sure you follow all gun safety procedures. What's next?  Visit your health care provider once a year for an annual wellness visit.  Ask your health care provider how often you should have your eyes and teeth checked.  Stay up to date on all vaccines. This information is not intended to replace advice given to you by your health care provider. Make sure you discuss any questions you have with your health care provider. Document Revised:  07/31/2020 Document Reviewed: 07/08/2018 Elsevier Patient Education  2021 [employer].

## 2021-01-21 NOTE — Assessment & Plan Note (Signed)
Rare use of ambien ~#30 for 1 year.

## 2021-01-21 NOTE — Assessment & Plan Note (Signed)
Normal breast exam. Discussed trial of topical NSAIDs. And getting routine mammogram in June. Return or call if worsening.

## 2021-01-21 NOTE — Progress Notes (Signed)
Annual Exam   Chief Complaint:  Chief Complaint  Patient presents with  . Annual Exam  . Gynecologic Exam    W/ breast exam (by request)    History of Present Illness:  Ms. Darlene Spencer is a 60 y.o. [id number] who LMP was Patient's last menstrual period was 11/10/1996., presents today for her annual examination.    #Breast pain - 3 months of breast pain on the right - no skin change - no discharge - pain comes and goes - pain not happening daily - no lumps or lesions  Nutrition She does get adequate calcium and Vitamin D in her diet. Diet: not good - not enough fruit/veggies Exercise: walking 3 miles every morninging  Safety The patient wears seatbelts: yes.     The patient feels safe at home and in their relationships: yes.   Menstrual:  S/p hysterectomy No menopause symptoms  GYN She is single partner, contraception - status post hysterectomy.    Cervical Cancer Screening (21-65):   Last Pap:   August 2012 Results were: no abnormalities /neg HPV DNA   Breast Cancer Screening (Age 67-74):  There is no FH of breast cancer. There is no FH of ovarian cancer. BRCA screening Not Indicated.  Last Mammogram: 04/2020 The patient does want a mammogram this year.    Colon Cancer Screening:  Age 56-75 yo - benefits outweigh the risk. Adults 30-85 yo who have never been screened benefit.  Benefits: 134000 people in 2016 will be diagnosed and 49,000 will die - early detection helps [place]: Complications 2/2 to colonoscopy High Risk (Colonoscopy): genetic disorder (Lynch syndrome or familial adenomatous polyposis), personal hx of IBD, previous adenomatous polyp, or previous colorectal cancer, FamHx start 10 years before the age at diagnosis, increased in males and black race  Options:  FIT - looks for hemoglobin (blood in the stool) - specific and fairly sensitive - must be done annually Cologuard - looks for DNA and blood - more sensitive - therefore can have more false  positives, every 3 years Colonoscopy - every 10 years if normal - sedation, bowl prep, must have someone drive you  Shared decision making and the patient had decided to do colonoscopy 2028.   Social History   Tobacco Use  Smoking Status Never Smoker  Smokeless Tobacco Never Used    Weight Wt Readings from Last 3 Encounters:  01/21/21 119 lb (54 kg)  03/12/20 128 lb 8 oz (58.3 kg)  02/27/20 127 lb 5 oz (57.7 kg)   Patient has normal BMI  BMI Readings from Last 1 Encounters:  01/21/21 20.75 kg/m     Chronic disease screening Blood pressure monitoring:  BP Readings from Last 3 Encounters:  01/21/21 102/78  03/12/20 122/84  02/27/20 130/76    Lipid Monitoring: Indication for screening: age >61, obesity, diabetes, family hx, CV risk factors.  Lipid screening: will get with work  Lab Results  Component Value Date   CHOL 171 06/17/2020   HDL 73 (A) 06/17/2020   [place] 88 06/17/2020   LDLDIRECT 139.1 05/09/2013   TRIG 52 06/17/2020   CHOLHDL 2 03/12/2020     Diabetes Screening: age >24, overweight, family hx, PCOS, hx of gestational diabetes, at risk ethnicity Diabetes Screening screening: Yes  Lab Results  Component Value Date   HGBA1C 5.7 03/11/2017     Past Medical History:  Diagnosis Date  . Endometriosis 2007   endometriotc cyst Rt.ovary  . Headache(784.0)   . Hyperlipidemia    on  meds  . Osteopenia 08/2017   T score -1.6 FRAX 6.6% / 0.6%    Past Surgical History:  Procedure Laterality Date  . COLONOSCOPY    . OOPHORECTOMY  2007   BSO  . PELVIC LAPAROSCOPY  2007   BSO  . VAGINAL HYSTERECTOMY  1998    Prior to Admission medications   Medication Sig Start Date End Date Taking? Authorizing Provider  aspirin EC 81 MG tablet Take 81 mg by mouth daily.   Yes [provider]  atorvastatin (LIPITOR) 10 MG tablet TAKE 1 TABLET BY MOUTH EVERY DAY 05/11/20  Yes Lesleigh Noe, MD  Calcium Carbonate-Vitamin D 600-400 MG-UNIT tablet Take 1  tablet by mouth daily at 6 PM.   Yes [provider]  FIBER PO Take by mouth. Fiber Well Sugar -Free Gummies-Take 2 daily   Yes [provider]  fish oil-omega-3 fatty acids 1000 MG capsule Take 1 g by mouth daily.   Yes [provider]  Multiple Vitamin (MULTIVITAMIN) tablet Take 1 tablet by mouth. Women's MVI-Take 2 chewables daily   Yes [provider]  topiramate (TOPAMAX) 100 MG tablet Take 100 mg by mouth daily at 6 PM. 03/28/15  Yes [provider]  zolpidem (AMBIEN) 10 MG tablet Take 1 tablet (10 mg total) by mouth at bedtime as needed. for sleep 03/05/20  Yes Lesleigh Noe, MD    No Known Allergies  Gynecologic History: Patient's last menstrual period was 11/10/1996.  Obstetric History: [id number]  Social History   Socioeconomic History  . Marital status: Married    Spouse name: Kandie Keiper  . Number of children: 2  . Years of education: Not on file  . Highest education level: Associate degree: occupational, Hotel manager, or vocational program  Occupational History    Employer: [employer]  Tobacco Use  . Smoking status: Never Smoker  . Smokeless tobacco: Never Used  Vaping Use  . Vaping Use: Never used  Substance and Sexual Activity  . Alcohol use: Never  . Drug use: No  . Sexual activity: Yes    Birth control/protection: Surgical    Comment: intercourse age 3, less than 5 sexual parters des neg  Other Topics Concern  . 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  Has a community college education.  Lives with her husband Coralyn Mark.     They have 2 children and 3 grandchildren (25 and 35) - currently watching her grandchildren with homeschool     Exercise: walking, but not as much currently   Enjoys: spending time with grandchildren, yard work   Diet: not great since being home   Social Determinants of Radio broadcast assistant Strain: Not on file   Food Insecurity: Not on file  Transportation Needs: Not on file  Physical Activity: Not on file  Stress: Not on file  Social Connections: Not on file  Intimate Partner Violence: Not on file    Family History  Problem Relation Age of Onset  . Hypertension Mother   . Heart disease Father   . Heart failure Father   . Heart attack Father 17  . Hypertension Maternal Grandfather   . Colon cancer Maternal Grandfather     Review of Systems  Constitutional: Negative for chills and fever.  HENT: Negative for congestion and sore throat.   Eyes: Negative for blurred vision and double vision.  Respiratory: Negative for shortness of breath.  Cardiovascular: Negative for chest pain.  Gastrointestinal: Negative for heartburn, nausea and vomiting.  Genitourinary: Negative.   Musculoskeletal: Negative.  Negative for myalgias.  Skin: Negative for rash.  Neurological: Negative for dizziness and headaches.  Endo/Heme/Allergies: Does not bruise/bleed easily.  Psychiatric/Behavioral: Negative for depression. The patient is not nervous/anxious.      Physical Exam BP 102/78   Pulse 63   Temp 98 F (36.7 C) (Temporal)   Ht 5' 3.5" (1.613 m)   Wt 119 lb (54 kg)   LMP 11/10/1996   SpO2 99%   BMI 20.75 kg/m    BP Readings from Last 3 Encounters:  01/21/21 102/78  03/12/20 122/84  02/27/20 130/76      Physical Exam Exam conducted with a chaperone present.  Constitutional:      General: She is not in acute distress.    Appearance: She is well-developed. She is not diaphoretic.  HENT:     Head: Normocephalic and atraumatic.     Right Ear: External ear normal.     Left Ear: External ear normal.     Nose: Nose normal.  Eyes:     General: No scleral icterus.    Conjunctiva/sclera: Conjunctivae normal.  Cardiovascular:     Rate and Rhythm: Normal rate and regular rhythm.     Heart sounds: No murmur heard.   Pulmonary:     Effort: Pulmonary effort is normal. No respiratory  distress.     Breath sounds: Normal breath sounds. No wheezing.  Chest:  Breasts: Breasts are symmetrical.     Right: Normal. No inverted nipple or mass.     Left: Normal. No inverted nipple or mass.    Abdominal:     General: Bowel sounds are normal. There is no distension.     Palpations: Abdomen is soft. There is no mass.     Tenderness: There is no abdominal tenderness. There is no guarding or rebound.  Genitourinary:    Cervix: Erythema present. No cervical bleeding.  Musculoskeletal:        General: Normal range of motion.     Cervical back: Neck supple.  Lymphadenopathy:     Cervical: No cervical adenopathy.  Skin:    General: Skin is warm and dry.     Capillary Refill: Capillary refill takes less than 2 seconds.  Neurological:     Mental Status: She is alert and oriented to person, place, and time.     Deep Tendon Reflexes: Reflexes normal.  Psychiatric:        Behavior: Behavior normal.     Results:  PHQ-9:  [facility] [place] Office Visit from 03/31/2019 in [facility]  PHQ-9 Total Score 3        Assessment: 60 y.o. [id number] female here for routine annual physical examination.  Plan: Problem List Items Addressed This Visit      Other   Primary insomnia    Rare use of ambien ~#30 for 1 year.       Relevant Medications   zolpidem (AMBIEN) 10 MG tablet   Breast pain    Normal breast exam. Discussed trial of topical NSAIDs. And getting routine mammogram in June. Return or call if worsening.        Other Visit Diagnoses    Annual physical exam    -  Primary   Cervical cancer screening       Relevant Orders   Cytology - PAP      Screening: -- Blood pressure screen  normal -- cholesterol screening: will get with work -- Weight screening: normal -- Diabetes Screening: will get with work -- Nutrition: Encouraged healthy diet  The 10-year ASCVD risk score Mikey Bussing DC Jr., et al., 2013) is: 1.8%*   Values used to calculate the  score:     Age: 61 years     Sex: Female     Is Non-Hispanic African American: No     Diabetic: No     Tobacco smoker: No     Systolic Blood Pressure: 916 mmHg     Is BP treated: No     HDL Cholesterol: 73 mg/dL*     Total Cholesterol: 171 mg/dL*     * - Cholesterol units were assumed for this score calculation  -- Statin therapy for Age 32-75 with CVD risk >7.5%  Psych -- Depression screening (PHQ-9):  [facility] Visit from 03/31/2019 in [facility] at [facility]  PHQ-9 Total Score 3       Safety -- tobacco screening: not using -- alcohol screening:  low-risk usage. -- no evidence of domestic violence or intimate partner violence.   Cancer Screening -- pap smear collected per ASCCP guidelines -- family history of breast cancer screening: done. not at high risk. -- Mammogram - due in June -- Colon cancer (age 11+)-- up to date  Immunizations Immunization History  Administered Date(s) Administered  . Influenza Inj Mdck Quad Pf 07/30/2019  . Influenza, Quadrivalent, Recombinant, Inj, Pf 09/06/2018  . Influenza, Seasonal, Injecte, Preservative Fre 09/05/2015  . Influenza,inj,Quad PF,6+ Mos 07/30/2019, 09/09/2020  . Influenza-Unspecified 07/30/2019  . Moderna Sars-Covid-2 Vaccination 11/22/2019, 12/21/2019  . Td 08/28/2008  . Tdap 03/05/2015  . Zoster Recombinat (Shingrix) 12/14/2018, 04/26/2019    -- flu vaccine up to date -- TDAP q10 years up to date -- Shingles (age >50) up to date -- Covid-19 Vaccine up to date   Encouraged healthy diet and exercise. Encouraged regular vision and dental care.    Lesleigh Noe, MD

## 2021-01-22 LAB — CYTOLOGY - PAP
Comment: NEGATIVE
Diagnosis: NEGATIVE
High risk HPV: NEGATIVE

## 2021-01-27 ENCOUNTER — Encounter: Payer: Self-pay | Admitting: Family Medicine

## 2021-01-31 ENCOUNTER — Other Ambulatory Visit: Payer: Self-pay

## 2021-01-31 DIAGNOSIS — F5101 Primary insomnia: Secondary | ICD-10-CM

## 2021-01-31 MED ORDER — ZOLPIDEM TARTRATE 10 MG PO TABS: 10.0000 mg | ORAL_TABLET | Freq: Every evening | ORAL | 0 refills | Status: DC | PRN

## 2021-02-04 DIAGNOSIS — M25512 Pain in left shoulder: Secondary | ICD-10-CM | POA: Diagnosis not present

## 2021-02-06 ENCOUNTER — Ambulatory Visit: Payer: BC Managed Care – PPO | Admitting: Dermatology

## 2021-03-06 ENCOUNTER — Ambulatory Visit: Payer: BC Managed Care – PPO | Admitting: Family Medicine

## 2021-03-19 ENCOUNTER — Other Ambulatory Visit: Payer: Self-pay | Admitting: Family Medicine

## 2021-03-19 DIAGNOSIS — Z1231 Encounter for screening mammogram for malignant neoplasm of breast: Secondary | ICD-10-CM

## 2021-04-15 DIAGNOSIS — G43719 Chronic migraine without aura, intractable, without status migrainosus: Secondary | ICD-10-CM | POA: Diagnosis not present

## 2021-04-15 DIAGNOSIS — G43019 Migraine without aura, intractable, without status migrainosus: Secondary | ICD-10-CM | POA: Diagnosis not present

## 2021-05-03 ENCOUNTER — Other Ambulatory Visit: Payer: Self-pay | Admitting: Family Medicine

## 2021-05-03 DIAGNOSIS — E782 Mixed hyperlipidemia: Secondary | ICD-10-CM

## 2021-05-20 ENCOUNTER — Other Ambulatory Visit: Payer: Self-pay

## 2021-05-20 ENCOUNTER — Ambulatory Visit
Admission: RE | Admit: 2021-05-20 | Discharge: 2021-05-20 | Disposition: A | Discharge status: Home / Self Care | Payer: BC Managed Care – PPO | Source: Ambulatory Visit | Attending: Family Medicine | Admitting: Family Medicine

## 2021-05-20 DIAGNOSIS — Z1231 Encounter for screening mammogram for malignant neoplasm of breast: Secondary | ICD-10-CM

## 2021-06-24 ENCOUNTER — Telehealth: Payer: Self-pay | Admitting: Family Medicine

## 2021-06-24 DIAGNOSIS — E782 Mixed hyperlipidemia: Secondary | ICD-10-CM

## 2021-06-24 NOTE — Telephone Encounter (Signed)
Did pt get labs through work? This was the reason it was not done at her annual.   Did order labs to have done - she can schedule

## 2021-06-24 NOTE — Addendum Note (Signed)
Addended by: Lesleigh Noe on: 06/24/2021 02:37 PM   Modules accepted: Orders

## 2021-06-24 NOTE — Telephone Encounter (Signed)
Pt wanted to have lab drawn she didn't have them done at her c[facility] in March.

## 2021-06-26 ENCOUNTER — Other Ambulatory Visit: Payer: Self-pay

## 2021-06-26 ENCOUNTER — Other Ambulatory Visit (INDEPENDENT_AMBULATORY_CARE_PROVIDER_SITE_OTHER): Payer: BC Managed Care – PPO

## 2021-06-26 DIAGNOSIS — E782 Mixed hyperlipidemia: Secondary | ICD-10-CM | POA: Diagnosis not present

## 2021-06-26 DIAGNOSIS — D696 Thrombocytopenia, unspecified: Secondary | ICD-10-CM

## 2021-06-26 LAB — CBC WITH DIFFERENTIAL/PLATELET
Basophils Absolute: 0 10*3/uL (ref 0.0–0.1)
Basophils Relative: 0.7 % (ref 0.0–3.0)
Eosinophils Absolute: 0.1 10*3/uL (ref 0.0–0.7)
Eosinophils Relative: 2.4 % (ref 0.0–5.0)
HCT: 40 % (ref 36.0–46.0)
Hemoglobin: 13.5 g/dL (ref 12.0–15.0)
Lymphocytes Relative: 36.2 % (ref 12.0–46.0)
Lymphs Abs: 1.3 10*3/uL (ref 0.7–4.0)
MCHC: 33.9 g/dL (ref 30.0–36.0)
MCV: 94.9 fl (ref 78.0–100.0)
Monocytes Absolute: 0.4 10*3/uL (ref 0.1–1.0)
Monocytes Relative: 10.4 % (ref 3.0–12.0)
Neutro Abs: 1.8 10*3/uL (ref 1.4–7.7)
Neutrophils Relative %: 50.3 % (ref 43.0–77.0)
Platelets: 121 10*3/uL — ABNORMAL LOW (ref 150.0–400.0)
RBC: 4.22 Mil/uL (ref 3.87–5.11)
RDW: 12.9 % (ref 11.5–15.5)
WBC: 3.6 10*3/uL — ABNORMAL LOW (ref 4.0–10.5)

## 2021-06-26 LAB — LIPID PANEL
Cholesterol: 170 mg/dL (ref 0–200)
HDL: 76.5 mg/dL (ref >39.00)
LDL Cholesterol: 83 mg/dL (ref 0–99)
NonHDL: 93.78
Total CHOL/HDL Ratio: 2
Triglycerides: 54 mg/dL (ref 0.0–149.0)
VLDL: 10.8 mg/dL (ref 0.0–40.0)

## 2021-06-26 LAB — COMPREHENSIVE METABOLIC PANEL
ALT: 11 U/L (ref 0–35)
AST: 15 U/L (ref 0–37)
Albumin: 4.4 g/dL (ref 3.5–5.2)
Alkaline Phosphatase: 61 U/L (ref 39–117)
BUN: 18 mg/dL (ref 6–23)
CO2: 26 mEq/L (ref 19–32)
Calcium: 9.7 mg/dL (ref 8.4–10.5)
Chloride: 107 mEq/L (ref 96–112)
Creatinine, Ser: 0.9 mg/dL (ref 0.40–1.20)
GFR: 68.94 mL/min (ref >60.00)
Glucose, Bld: 93 mg/dL (ref 70–99)
Potassium: 4 mEq/L (ref 3.5–5.1)
Sodium: 140 mEq/L (ref 135–145)
Total Bilirubin: 0.8 mg/dL (ref 0.2–1.2)
Total Protein: 7 g/dL (ref 6.0–8.3)

## 2021-06-26 NOTE — Addendum Note (Signed)
Addended by: Ellamae Sia on: 06/26/2021 08:10 AM   Modules accepted: Orders

## 2021-07-01 DIAGNOSIS — M25512 Pain in left shoulder: Secondary | ICD-10-CM | POA: Diagnosis not present

## 2021-07-02 DIAGNOSIS — M25512 Pain in left shoulder: Secondary | ICD-10-CM | POA: Diagnosis not present

## 2021-07-08 DIAGNOSIS — M25512 Pain in left shoulder: Secondary | ICD-10-CM | POA: Diagnosis not present

## 2021-08-15 DIAGNOSIS — M7522 Bicipital tendinitis, left shoulder: Secondary | ICD-10-CM | POA: Diagnosis not present

## 2021-08-15 DIAGNOSIS — Y999 Unspecified external cause status: Secondary | ICD-10-CM | POA: Diagnosis not present

## 2021-08-15 DIAGNOSIS — M24112 Other articular cartilage disorders, left shoulder: Secondary | ICD-10-CM | POA: Diagnosis not present

## 2021-08-15 DIAGNOSIS — M75122 Complete rotator cuff tear or rupture of left shoulder, not specified as traumatic: Secondary | ICD-10-CM | POA: Diagnosis not present

## 2021-08-15 DIAGNOSIS — M7542 Impingement syndrome of left shoulder: Secondary | ICD-10-CM | POA: Diagnosis not present

## 2021-08-15 DIAGNOSIS — X58XXXA Exposure to other specified factors, initial encounter: Secondary | ICD-10-CM | POA: Diagnosis not present

## 2021-08-15 DIAGNOSIS — G8918 Other acute postprocedural pain: Secondary | ICD-10-CM | POA: Diagnosis not present

## 2021-08-15 DIAGNOSIS — S46012A Strain of muscle(s) and tendon(s) of the rotator cuff of left shoulder, initial encounter: Secondary | ICD-10-CM | POA: Diagnosis not present

## 2021-08-15 DIAGNOSIS — M7552 Bursitis of left shoulder: Secondary | ICD-10-CM | POA: Diagnosis not present

## 2021-09-10 DIAGNOSIS — M25512 Pain in left shoulder: Secondary | ICD-10-CM | POA: Diagnosis not present

## 2021-09-10 DIAGNOSIS — M25612 Stiffness of left shoulder, not elsewhere classified: Secondary | ICD-10-CM | POA: Diagnosis not present

## 2021-09-10 DIAGNOSIS — M6281 Muscle weakness (generalized): Secondary | ICD-10-CM | POA: Diagnosis not present

## 2021-09-12 DIAGNOSIS — M6281 Muscle weakness (generalized): Secondary | ICD-10-CM | POA: Diagnosis not present

## 2021-09-12 DIAGNOSIS — M25512 Pain in left shoulder: Secondary | ICD-10-CM | POA: Diagnosis not present

## 2021-09-12 DIAGNOSIS — M25612 Stiffness of left shoulder, not elsewhere classified: Secondary | ICD-10-CM | POA: Diagnosis not present

## 2021-09-12 DIAGNOSIS — S46012D Strain of muscle(s) and tendon(s) of the rotator cuff of left shoulder, subsequent encounter: Secondary | ICD-10-CM | POA: Diagnosis not present

## 2021-09-17 DIAGNOSIS — M25612 Stiffness of left shoulder, not elsewhere classified: Secondary | ICD-10-CM | POA: Diagnosis not present

## 2021-09-17 DIAGNOSIS — M6281 Muscle weakness (generalized): Secondary | ICD-10-CM | POA: Diagnosis not present

## 2021-09-17 DIAGNOSIS — M25512 Pain in left shoulder: Secondary | ICD-10-CM | POA: Diagnosis not present

## 2021-09-19 DIAGNOSIS — M25612 Stiffness of left shoulder, not elsewhere classified: Secondary | ICD-10-CM | POA: Diagnosis not present

## 2021-09-19 DIAGNOSIS — M25512 Pain in left shoulder: Secondary | ICD-10-CM | POA: Diagnosis not present

## 2021-09-19 DIAGNOSIS — S46012D Strain of muscle(s) and tendon(s) of the rotator cuff of left shoulder, subsequent encounter: Secondary | ICD-10-CM | POA: Diagnosis not present

## 2021-09-19 DIAGNOSIS — M6281 Muscle weakness (generalized): Secondary | ICD-10-CM | POA: Diagnosis not present

## 2021-09-24 DIAGNOSIS — M25612 Stiffness of left shoulder, not elsewhere classified: Secondary | ICD-10-CM | POA: Diagnosis not present

## 2021-09-24 DIAGNOSIS — M6281 Muscle weakness (generalized): Secondary | ICD-10-CM | POA: Diagnosis not present

## 2021-09-24 DIAGNOSIS — M25512 Pain in left shoulder: Secondary | ICD-10-CM | POA: Diagnosis not present

## 2021-09-24 DIAGNOSIS — S46012D Strain of muscle(s) and tendon(s) of the rotator cuff of left shoulder, subsequent encounter: Secondary | ICD-10-CM | POA: Diagnosis not present

## 2021-09-26 DIAGNOSIS — M25612 Stiffness of left shoulder, not elsewhere classified: Secondary | ICD-10-CM | POA: Diagnosis not present

## 2021-09-26 DIAGNOSIS — M6281 Muscle weakness (generalized): Secondary | ICD-10-CM | POA: Diagnosis not present

## 2021-09-26 DIAGNOSIS — M25512 Pain in left shoulder: Secondary | ICD-10-CM | POA: Diagnosis not present

## 2021-10-07 DIAGNOSIS — M25512 Pain in left shoulder: Secondary | ICD-10-CM | POA: Diagnosis not present

## 2021-10-07 DIAGNOSIS — S46012D Strain of muscle(s) and tendon(s) of the rotator cuff of left shoulder, subsequent encounter: Secondary | ICD-10-CM | POA: Diagnosis not present

## 2021-10-07 DIAGNOSIS — M25612 Stiffness of left shoulder, not elsewhere classified: Secondary | ICD-10-CM | POA: Diagnosis not present

## 2021-10-07 DIAGNOSIS — M6281 Muscle weakness (generalized): Secondary | ICD-10-CM | POA: Diagnosis not present

## 2021-10-10 DIAGNOSIS — G43719 Chronic migraine without aura, intractable, without status migrainosus: Secondary | ICD-10-CM | POA: Diagnosis not present

## 2021-10-10 DIAGNOSIS — G43019 Migraine without aura, intractable, without status migrainosus: Secondary | ICD-10-CM | POA: Diagnosis not present

## 2021-10-14 DIAGNOSIS — M25512 Pain in left shoulder: Secondary | ICD-10-CM | POA: Diagnosis not present

## 2021-10-14 DIAGNOSIS — M25612 Stiffness of left shoulder, not elsewhere classified: Secondary | ICD-10-CM | POA: Diagnosis not present

## 2021-10-14 DIAGNOSIS — M6281 Muscle weakness (generalized): Secondary | ICD-10-CM | POA: Diagnosis not present

## 2021-10-14 DIAGNOSIS — S46012D Strain of muscle(s) and tendon(s) of the rotator cuff of left shoulder, subsequent encounter: Secondary | ICD-10-CM | POA: Diagnosis not present

## 2021-10-16 DIAGNOSIS — S46012D Strain of muscle(s) and tendon(s) of the rotator cuff of left shoulder, subsequent encounter: Secondary | ICD-10-CM | POA: Diagnosis not present

## 2021-10-16 DIAGNOSIS — M25512 Pain in left shoulder: Secondary | ICD-10-CM | POA: Diagnosis not present

## 2021-10-16 DIAGNOSIS — M6281 Muscle weakness (generalized): Secondary | ICD-10-CM | POA: Diagnosis not present

## 2021-10-16 DIAGNOSIS — M25612 Stiffness of left shoulder, not elsewhere classified: Secondary | ICD-10-CM | POA: Diagnosis not present

## 2021-10-21 DIAGNOSIS — M6281 Muscle weakness (generalized): Secondary | ICD-10-CM | POA: Diagnosis not present

## 2021-10-21 DIAGNOSIS — S46012D Strain of muscle(s) and tendon(s) of the rotator cuff of left shoulder, subsequent encounter: Secondary | ICD-10-CM | POA: Diagnosis not present

## 2021-10-21 DIAGNOSIS — M25512 Pain in left shoulder: Secondary | ICD-10-CM | POA: Diagnosis not present

## 2021-10-21 DIAGNOSIS — M25612 Stiffness of left shoulder, not elsewhere classified: Secondary | ICD-10-CM | POA: Diagnosis not present

## 2021-10-23 DIAGNOSIS — M25512 Pain in left shoulder: Secondary | ICD-10-CM | POA: Diagnosis not present

## 2021-10-23 DIAGNOSIS — M25612 Stiffness of left shoulder, not elsewhere classified: Secondary | ICD-10-CM | POA: Diagnosis not present

## 2021-10-23 DIAGNOSIS — M6281 Muscle weakness (generalized): Secondary | ICD-10-CM | POA: Diagnosis not present

## 2021-10-23 DIAGNOSIS — S46012D Strain of muscle(s) and tendon(s) of the rotator cuff of left shoulder, subsequent encounter: Secondary | ICD-10-CM | POA: Diagnosis not present

## 2021-11-07 DIAGNOSIS — S46012D Strain of muscle(s) and tendon(s) of the rotator cuff of left shoulder, subsequent encounter: Secondary | ICD-10-CM | POA: Diagnosis not present

## 2021-11-07 DIAGNOSIS — M6281 Muscle weakness (generalized): Secondary | ICD-10-CM | POA: Diagnosis not present

## 2021-11-07 DIAGNOSIS — M25612 Stiffness of left shoulder, not elsewhere classified: Secondary | ICD-10-CM | POA: Diagnosis not present

## 2021-11-07 DIAGNOSIS — M25512 Pain in left shoulder: Secondary | ICD-10-CM | POA: Diagnosis not present

## 2022-01-24 ENCOUNTER — Encounter: Payer: Self-pay | Admitting: Family Medicine

## 2022-01-24 ENCOUNTER — Ambulatory Visit (INDEPENDENT_AMBULATORY_CARE_PROVIDER_SITE_OTHER): Payer: BC Managed Care – PPO | Admitting: Family Medicine

## 2022-01-24 ENCOUNTER — Other Ambulatory Visit: Payer: Self-pay

## 2022-01-24 VITALS — BP 108/80 | HR 60 | Temp 98.0°F | Ht 63.5 in | Wt 121.3 lb

## 2022-01-24 DIAGNOSIS — E782 Mixed hyperlipidemia: Secondary | ICD-10-CM | POA: Diagnosis not present

## 2022-01-24 DIAGNOSIS — F5101 Primary insomnia: Secondary | ICD-10-CM | POA: Diagnosis not present

## 2022-01-24 DIAGNOSIS — Z114 Encounter for screening for human immunodeficiency virus [HIV]: Secondary | ICD-10-CM | POA: Diagnosis not present

## 2022-01-24 DIAGNOSIS — Z0000 Encounter for general adult medical examination without abnormal findings: Secondary | ICD-10-CM

## 2022-01-24 DIAGNOSIS — D696 Thrombocytopenia, unspecified: Secondary | ICD-10-CM

## 2022-01-24 DIAGNOSIS — E2839 Other primary ovarian failure: Secondary | ICD-10-CM

## 2022-01-24 DIAGNOSIS — M8580 Other specified disorders of bone density and structure, unspecified site: Secondary | ICD-10-CM | POA: Diagnosis not present

## 2022-01-24 LAB — CBC WITH DIFFERENTIAL/PLATELET
Basophils Absolute: 0 10*3/uL (ref 0.0–0.1)
Basophils Relative: 0.7 % (ref 0.0–3.0)
Eosinophils Absolute: 0.1 10*3/uL (ref 0.0–0.7)
Eosinophils Relative: 2.1 % (ref 0.0–5.0)
HCT: 38.8 % (ref 36.0–46.0)
Hemoglobin: 13 g/dL (ref 12.0–15.0)
Lymphocytes Relative: 42.5 % (ref 12.0–46.0)
Lymphs Abs: 1.4 10*3/uL (ref 0.7–4.0)
MCHC: 33.6 g/dL (ref 30.0–36.0)
MCV: 95.3 fl (ref 78.0–100.0)
Monocytes Absolute: 0.4 10*3/uL (ref 0.1–1.0)
Monocytes Relative: 11.4 % (ref 3.0–12.0)
Neutro Abs: 1.5 10*3/uL (ref 1.4–7.7)
Neutrophils Relative %: 43.3 % (ref 43.0–77.0)
Platelets: 120 10*3/uL — ABNORMAL LOW (ref 150.0–400.0)
RBC: 4.07 Mil/uL (ref 3.87–5.11)
RDW: 12.7 % (ref 11.5–15.5)
WBC: 3.4 10*3/uL — ABNORMAL LOW (ref 4.0–10.5)

## 2022-01-24 LAB — VITAMIN B12: Vitamin B-12: 946 pg/mL — ABNORMAL HIGH (ref 211–911)

## 2022-01-24 LAB — COMPREHENSIVE METABOLIC PANEL
ALT: 11 U/L (ref 0–35)
AST: 15 U/L (ref 0–37)
Albumin: 4.4 g/dL (ref 3.5–5.2)
Alkaline Phosphatase: 65 U/L (ref 39–117)
BUN: 19 mg/dL (ref 6–23)
CO2: 29 mEq/L (ref 19–32)
Calcium: 9.4 mg/dL (ref 8.4–10.5)
Chloride: 106 mEq/L (ref 96–112)
Creatinine, Ser: 0.97 mg/dL (ref 0.40–1.20)
GFR: 62.75 mL/min (ref >60.00)
Glucose, Bld: 89 mg/dL (ref 70–99)
Potassium: 4 mEq/L (ref 3.5–5.1)
Sodium: 141 mEq/L (ref 135–145)
Total Bilirubin: 0.8 mg/dL (ref 0.2–1.2)
Total Protein: 6.6 g/dL (ref 6.0–8.3)

## 2022-01-24 LAB — LIPID PANEL
Cholesterol: 158 mg/dL (ref 0–200)
HDL: 74.8 mg/dL (ref >39.00)
LDL Cholesterol: 72 mg/dL (ref 0–99)
NonHDL: 83.26
Total CHOL/HDL Ratio: 2
Triglycerides: 54 mg/dL (ref 0.0–149.0)
VLDL: 10.8 mg/dL (ref 0.0–40.0)

## 2022-01-24 MED ORDER — ZOLPIDEM TARTRATE 10 MG PO TABS: 10.0000 mg | ORAL_TABLET | Freq: Every evening | ORAL | 0 refills | Status: DC | PRN

## 2022-01-24 NOTE — Progress Notes (Signed)
Annual Exam  ? ?Chief Complaint:  ?Chief Complaint  ?Patient presents with  ? Annual Exam  ?  No concerns  ?Will get flu shot info   ? ? ?History of Present Illness:  ?Ms. Darlene Spencer is a 61 y.o. [id number] who LMP was Patient's last menstrual period was 11/10/1996., presents today for her annual examination.   ? ?Last filled by her surgeon for shoulder surgery ? ?Insomnia ?- trouble staying asleep ?- used some after surgery more often ?- failed OTC options ? ? ?Nutrition ?She does not get adequate calcium and Vitamin D in her diet. ?Diet: does not do dairy, on a supplement ?Exercise: walking daily ? ? ? ?Social History  ? ?Tobacco Use  ?Smoking Status Never  ?Smokeless Tobacco Never  ? ?Social History  ? ?Substance and Sexual Activity  ?Alcohol Use Never  ? ?Social History  ? ?Substance and Sexual Activity  ?Drug Use No  ? ? ? ?General Health ?Dentist in the last year: Yes ?Eye doctor: yes ? ?Safety ?The patient wears seatbelts: yes.     ?The patient feels safe at home and in their relationships: yes. ? ? ?Menstrual:  ?Symptoms of menopause:no issues ? ?GYN ?She is single partner, contraception - status post hysterectomy.  ? ? ?Cervical Cancer Screening (21-65):   ?S/p hysterectomy ? ?Breast Cancer Screening (Age 46-74):  ?There is no FH of breast cancer. There is no FH of ovarian cancer. BRCA screening Not Indicated.  ?Last Mammogram: 05/2021 ?The patient does want a mammogram this year.  ? ? ?Colon Cancer Screening:  ?Age 28-75 yo - benefits outweigh the risk. Adults 77-85 yo who have never been screened benefit.  ?Benefits: 134000 people in 2016 will be diagnosed and 49,000 will die - early detection helps ?Harms: Complications 2/2 to colonoscopy ?High Risk (Colonoscopy): genetic disorder (Lynch syndrome or familial adenomatous polyposis), personal hx of IBD, previous adenomatous polyp, or previous colorectal cancer, FamHx start 10 years before the age at diagnosis, increased in males and black race ? ?Options:   ?FIT - looks for hemoglobin (blood in the stool) - specific and fairly sensitive - must be done annually ?Cologuard - looks for DNA and blood - more sensitive - therefore can have more false positives, every 3 years ?Colonoscopy - every 10 years if normal - sedation, bowl prep, must have someone drive you ? ?Shared decision making and the patient had decided to do colonoscopy 2028. ? ? ?Social History  ? ?Tobacco Use  ?Smoking Status Never  ?Smokeless Tobacco Never  ? ? ?Lung Cancer Screening (Ages 58-52): not applicable ? ? ?Weight ?Wt Readings from Last 3 Encounters:  ?01/24/22 121 lb 5 oz (55 kg)  ?01/21/21 119 lb (54 kg)  ?03/12/20 128 lb 8 oz (58.3 kg)  ? ?Patient has normal BMI  ?BMI Readings from Last 1 Encounters:  ?01/24/22 21.15 kg/m?  ? ? ? ?Chronic disease screening ?Blood pressure monitoring:  ?BP Readings from Last 3 Encounters:  ?01/24/22 108/80  ?01/21/21 102/78  ?03/12/20 122/84  ? ? ?Lipid Monitoring: Indication for screening: age >70, obesity, diabetes, family hx, CV risk factors.  ?Lipid screening: Yes ? ?Lab Results  ?Component Value Date  ? CHOL 170 06/26/2021  ? HDL 76.50 06/26/2021  ? [place] 83 06/26/2021  ? LDLDIRECT 139.1 05/09/2013  ? TRIG 54.0 06/26/2021  ? CHOLHDL 2 06/26/2021  ? ? ? ?Diabetes Screening: age >49, overweight, family hx, PCOS, hx of gestational diabetes, at risk ethnicity ?Diabetes Screening screening: Yes ? ?  Lab Results  ?Component Value Date  ? HGBA1C 5.7 03/11/2017  ? ? ? ?Past Medical History:  ?Diagnosis Date  ? Endometriosis 2007  ? endometriotc cyst Rt.ovary  ? Headache(784.0)   ? Hyperlipidemia   ? on meds  ? Osteopenia 08/2017  ? T score -1.6 FRAX 6.6% / 0.6%  ? ? ?Past Surgical History:  ?Procedure Laterality Date  ? COLONOSCOPY    ? OOPHORECTOMY  2007  ? BSO  ? PELVIC LAPAROSCOPY  2007  ? BSO  ? VAGINAL HYSTERECTOMY  1998  ? ? ?Prior to Admission medications   ?Medication Sig Start Date End Date Taking? Authorizing Provider  ?aspirin EC 81 MG tablet Take 81 mg  by mouth daily.   Yes [provider]  ?atorvastatin (LIPITOR) 10 MG tablet TAKE 1 TABLET BY MOUTH EVERY DAY 05/03/21  Yes Lesleigh Noe, MD  ?Calcium Carbonate-Vitamin D 600-400 MG-UNIT tablet Take 1 tablet by mouth daily at 6 PM.   Yes [provider]  ?FIBER PO Take by mouth. Fiber Well Sugar -Free Gummies-Take 2 daily   Yes [provider]  ?fish oil-omega-3 fatty acids 1000 MG capsule Take 1 g by mouth daily.   Yes [provider]  ?Multiple Vitamin (MULTIVITAMIN) tablet Take 1 tablet by mouth. Women's MVI-Take 2 chewables daily   Yes [provider]  ?topiramate (TOPAMAX) 100 MG tablet Take 100 mg by mouth daily at 6 PM. 03/28/15  Yes [provider]  ?zolpidem (AMBIEN) 10 MG tablet Take 1 tablet (10 mg total) by mouth at bedtime as needed. for sleep 01/31/21  Yes Lesleigh Noe, MD  ? ? ?No Known Allergies ? ?Gynecologic History: Patient's last menstrual period was 11/10/1996. ? ?Obstetric History: [id number] ? ?Social History  ? ?Socioeconomic History  ? Marital status: Married  ?  Spouse name: Aaliyan Brinkmeier  ? Number of children: 2  ? Years of education: Not on file  ? Highest education level: Associate degree: occupational, Hotel manager, or vocational program  ?Occupational History  ?  Employer: [employer]  ?Tobacco Use  ? Smoking status: Never  ? Smokeless tobacco: Never  ?Vaping Use  ? Vaping Use: Never used  ?Substance and Sexual Activity  ? Alcohol use: Never  ? Drug use: No  ? Sexual activity: Yes  ?  Birth control/protection: Surgical  ?  Comment: intercourse age 3, less than 5 sexual parters des neg  ?Other Topics Concern  ? Not on file  ?Social History Narrative  ? 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  Has a community college education.  Lives with her husband Coralyn Mark.    ? They have 2 children and 3 grandchildren (83 and 9) - currently watching her grandchildren with homeschool    ? Exercise: walking, but  not as much currently  ? Enjoys: spending time with grandchildren, yard work  ? Diet: not great since being home  ? ?Social Determinants of Health  ? ?Financial Resource Strain: Not on file  ?Food Insecurity: Not on file  ?Transportation Needs: Not on file  ?Physical Activity: Not on file  ?Stress: Not on file  ?Social Connections: Not on file  ?Intimate Partner Violence: Not on file  ? ? ?Family History  ?Problem Relation Age of Onset  ? Hypertension Mother   ? Heart disease Father   ? Heart failure Father   ? Heart attack Father 57  ? Hypertension Maternal Grandfather   ? Colon cancer Maternal Grandfather   ? ? ?  Review of Systems  ?Constitutional:  Negative for chills and fever.  ?HENT:  Negative for congestion and sore throat.   ?Eyes:  Negative for blurred vision and double vision.  ?Respiratory:  Negative for shortness of breath.   ?Cardiovascular:  Negative for chest pain.  ?Gastrointestinal:  Negative for heartburn, nausea and vomiting.  ?Genitourinary: Negative.   ?Musculoskeletal: Negative.  Negative for myalgias.  ?Skin:  Negative for rash.  ?Neurological:  Negative for dizziness and headaches.  ?Endo/Heme/Allergies:  Does not bruise/bleed easily.  ?Psychiatric/Behavioral:  Negative for depression. The patient has insomnia. The patient is not nervous/anxious.    ? ?Physical Exam ?BP 108/80   Pulse 60   Temp 98 ?F (36.7 ?C) (Oral)   Ht 5' 3.5" (1.613 m)   Wt 121 lb 5 oz (55 kg)   LMP 11/10/1996   SpO2 98%   BMI 21.15 kg/m?   ? ?BP Readings from Last 3 Encounters:  ?01/24/22 108/80  ?01/21/21 102/78  ?03/12/20 122/84  ? ? ? ? ?Physical Exam ?Constitutional:   ?   General: She is not in acute distress. ?   Appearance: She is well-developed. She is not diaphoretic.  ?HENT:  ?   Head: Normocephalic and atraumatic.  ?   Right Ear: External ear normal.  ?   Left Ear: External ear normal.  ?   Nose: Nose normal.  ?Eyes:  ?   General: No scleral icterus. ?   Extraocular Movements: Extraocular movements  intact.  ?   Conjunctiva/sclera: Conjunctivae normal.  ?Cardiovascular:  ?   Rate and Rhythm: Normal rate and regular rhythm.  ?   Heart sounds: No murmur heard. ?Pulmonary:  ?   Effort: Pulmonary effort is norma

## 2022-01-24 NOTE — Patient Instructions (Addendum)
Low platelets ?- labs today ?- will add on blood smear if abnormal ?- depending on work-up we can consider hematology ? ?Insomnia ?- continue as needed ambien ?- try to limit ? ? ?

## 2022-01-27 ENCOUNTER — Encounter: Payer: Self-pay | Admitting: Family Medicine

## 2022-01-27 LAB — HIV ANTIBODY (ROUTINE TESTING W REFLEX): HIV 1&2 Ab, 4th Generation: NONREACTIVE

## 2022-01-28 ENCOUNTER — Other Ambulatory Visit: Payer: Self-pay | Admitting: Family Medicine

## 2022-01-28 DIAGNOSIS — Z1231 Encounter for screening mammogram for malignant neoplasm of breast: Secondary | ICD-10-CM

## 2022-02-07 IMAGING — US US PELVIS COMPLETE WITH TRANSVAGINAL
1 series · 14 of 25 positions shown · non-contrast
Comparison: None

CLINICAL DATA: Left lower pelvic pain



[Series 1: us pelvis complete with transvaginal · 0.26mm/px · 14 of 51 slices shown]
[im 1/51]
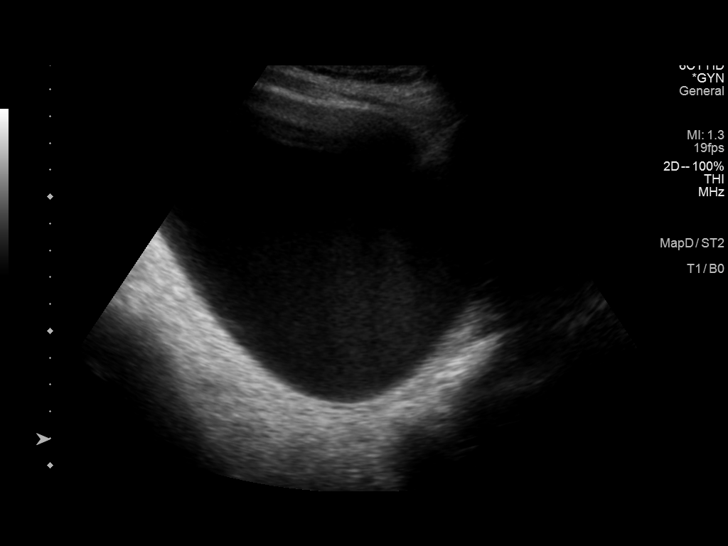
[im 5/51]
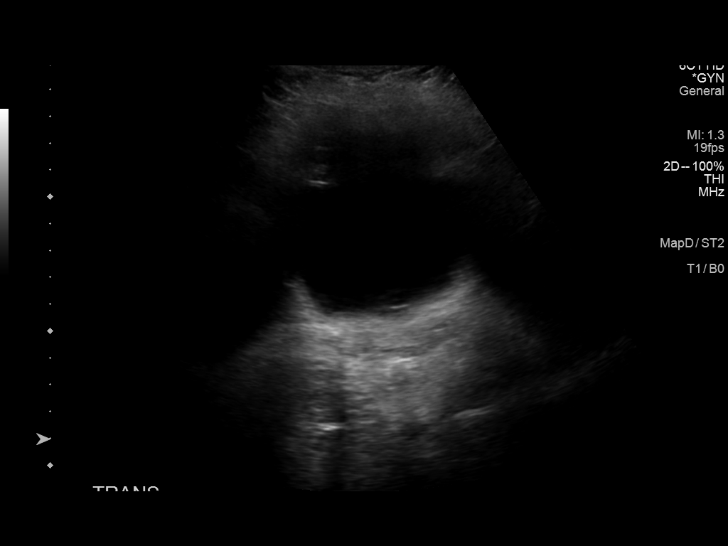
[im 9/51]
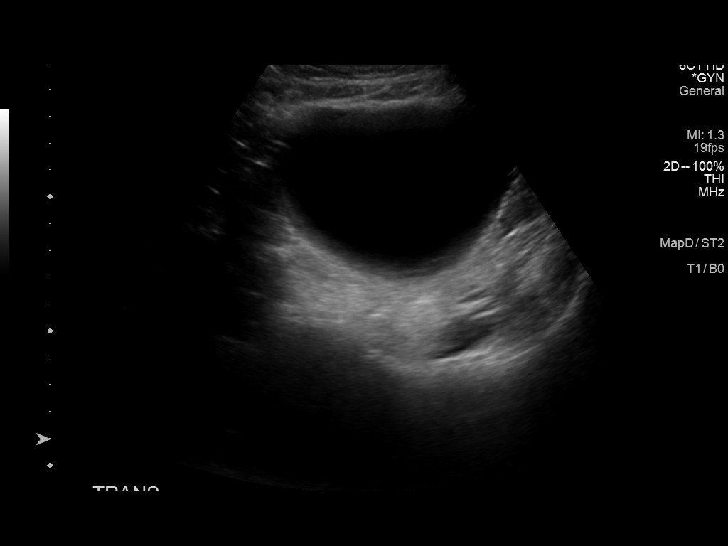
[im 13/51]
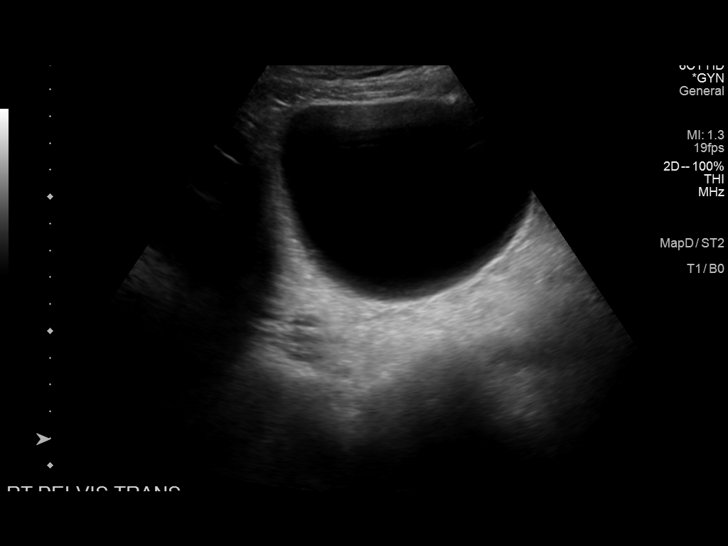
[im 17/51]
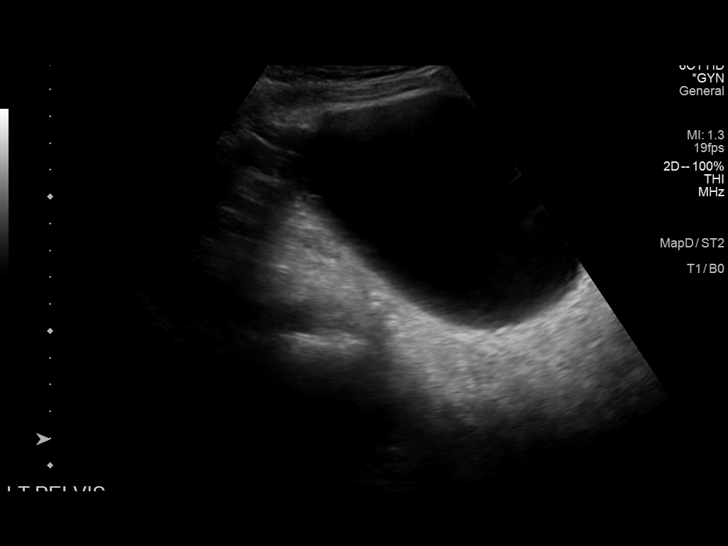
[im 19/51]
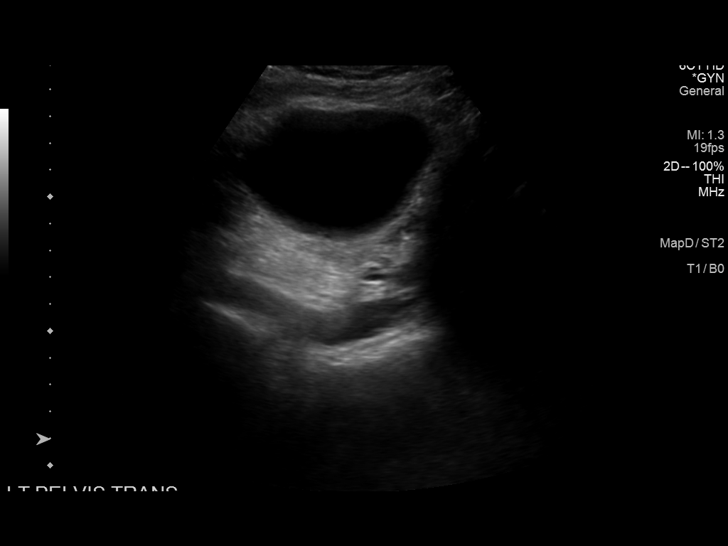
[im 23/51]
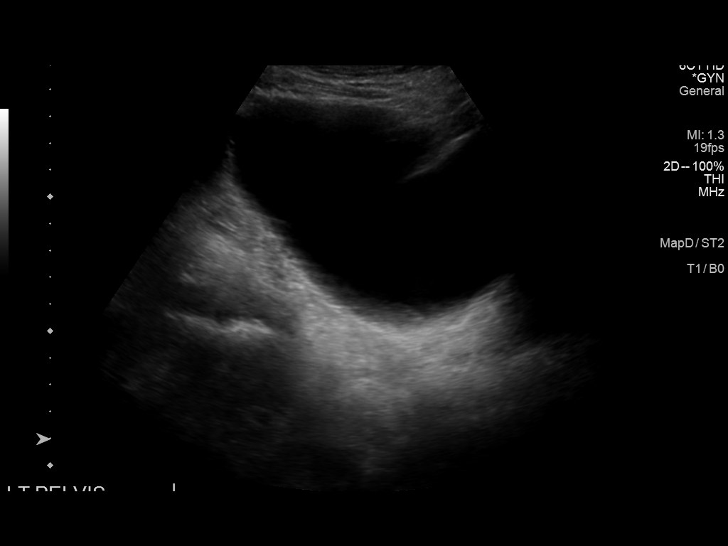
[im 28/51]
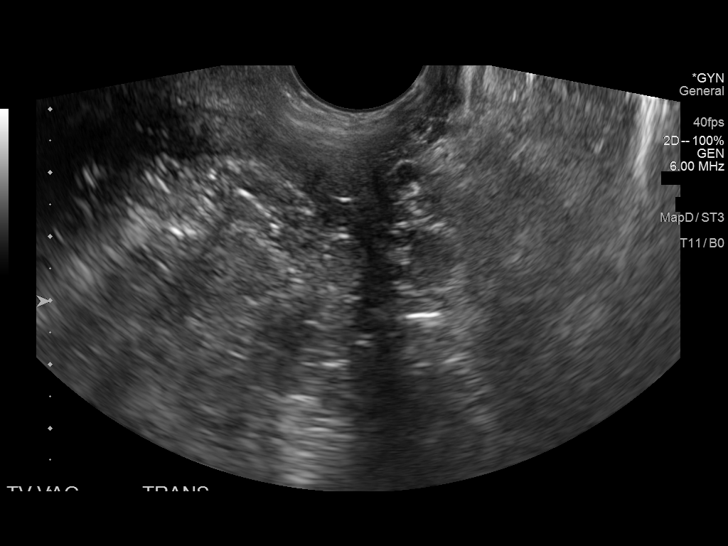
[im 32/51]
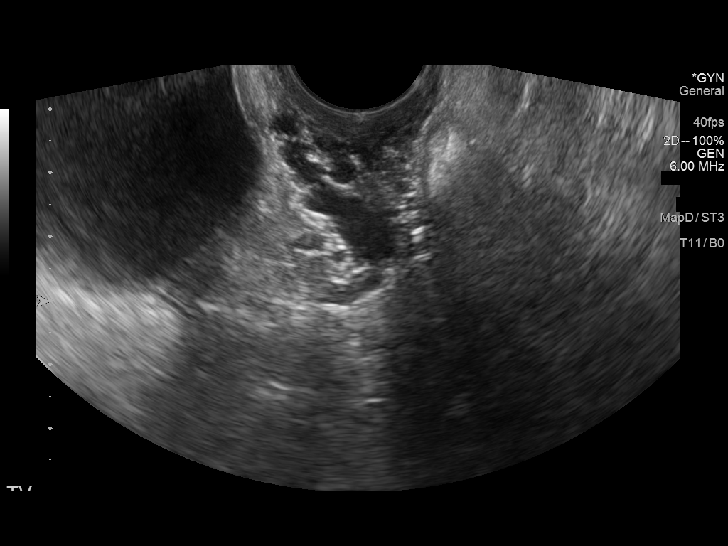
[im 34/51]
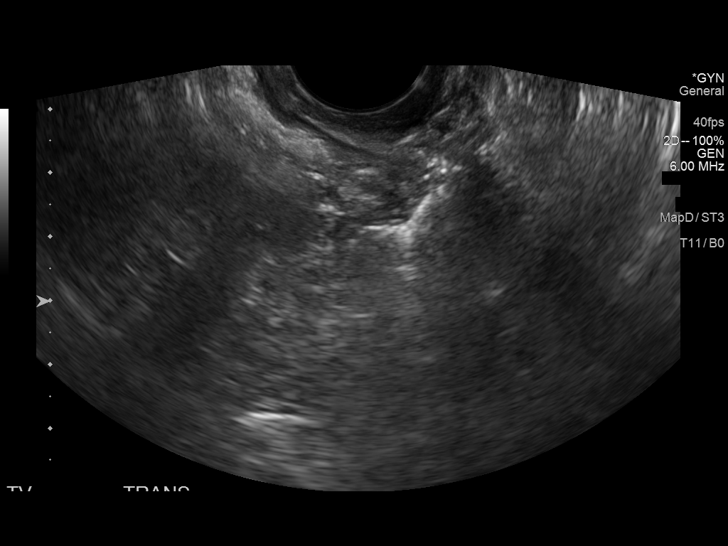
[im 38/51]
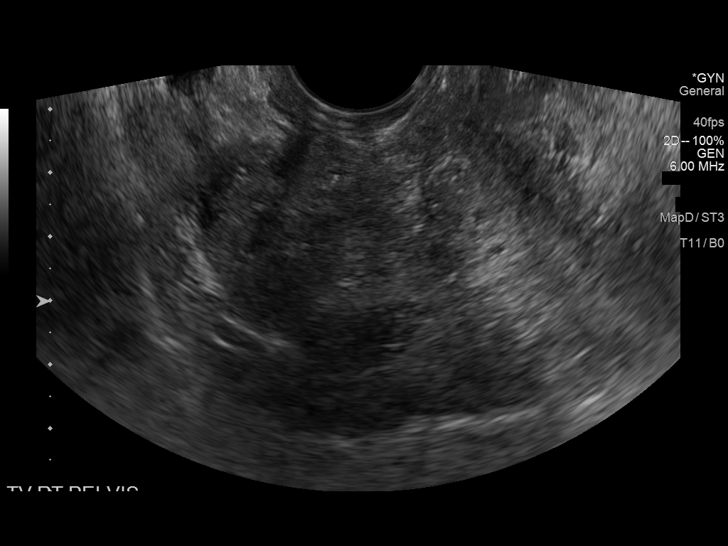
[im 42/51]
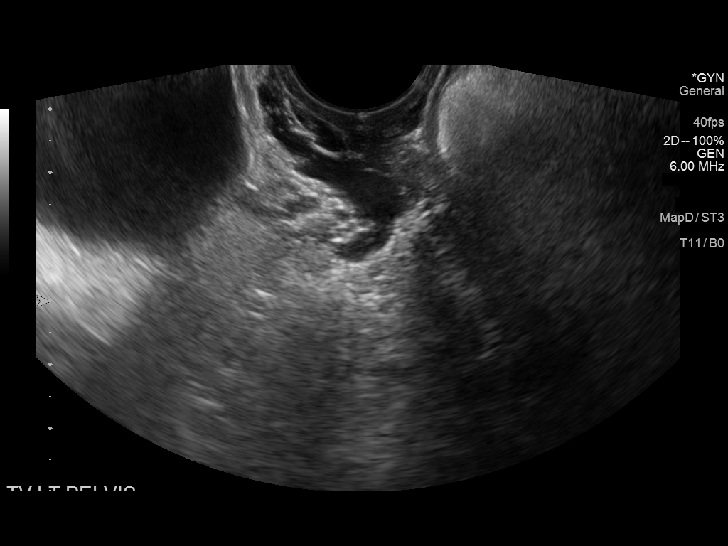
[im 46/51]
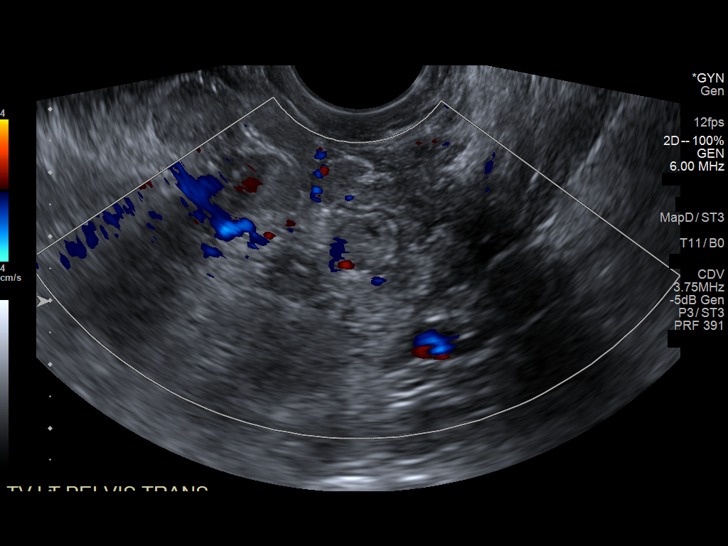
[im 51/51]
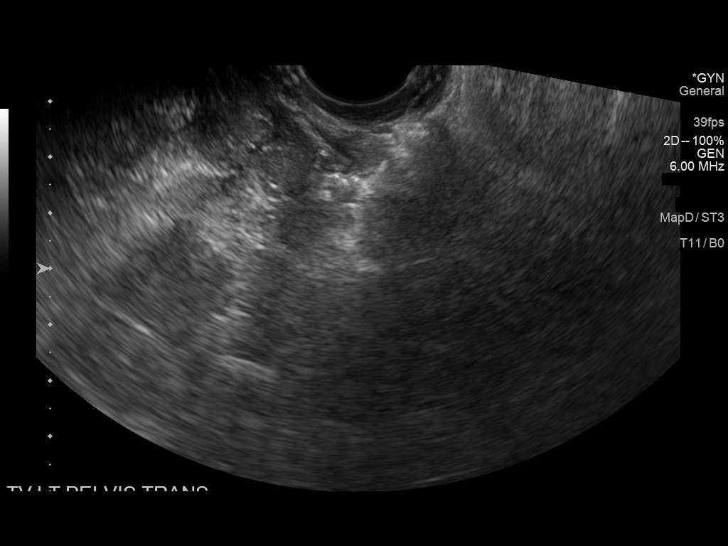

[14 of 25 positions shown; findings below may reference images not displayed]

FINDINGS: Uterus

Status post hysterectomy

Endometrium

Status post hysterectomy

Right ovary

Nonvisualized, history of oophorectomy

Left ovary

Nonvisualized, history of oophorectomy

Other findings

No abnormal free fluid.
IMPRESSION: Status post hysterectomy and oophorectomy.  Negative examination.

## 2022-02-08 ENCOUNTER — Encounter: Payer: Self-pay | Admitting: Family Medicine

## 2022-02-08 DIAGNOSIS — D72819 Decreased white blood cell count, unspecified: Secondary | ICD-10-CM

## 2022-02-08 DIAGNOSIS — D696 Thrombocytopenia, unspecified: Secondary | ICD-10-CM

## 2022-02-13 ENCOUNTER — Other Ambulatory Visit (INDEPENDENT_AMBULATORY_CARE_PROVIDER_SITE_OTHER): Payer: BC Managed Care – PPO

## 2022-02-13 DIAGNOSIS — D72819 Decreased white blood cell count, unspecified: Secondary | ICD-10-CM

## 2022-02-13 DIAGNOSIS — D696 Thrombocytopenia, unspecified: Secondary | ICD-10-CM

## 2022-02-13 DIAGNOSIS — D709 Neutropenia, unspecified: Secondary | ICD-10-CM | POA: Diagnosis not present

## 2022-02-14 LAB — PATHOLOGIST SMEAR REVIEW

## 2022-02-26 ENCOUNTER — Encounter: Payer: Self-pay | Admitting: Dermatology

## 2022-02-26 ENCOUNTER — Ambulatory Visit (INDEPENDENT_AMBULATORY_CARE_PROVIDER_SITE_OTHER): Payer: BC Managed Care – PPO | Admitting: Dermatology

## 2022-02-26 DIAGNOSIS — L820 Inflamed seborrheic keratosis: Secondary | ICD-10-CM | POA: Diagnosis not present

## 2022-02-26 DIAGNOSIS — Z1283 Encounter for screening for malignant neoplasm of skin: Secondary | ICD-10-CM | POA: Diagnosis not present

## 2022-02-26 DIAGNOSIS — D225 Melanocytic nevi of trunk: Secondary | ICD-10-CM

## 2022-02-26 DIAGNOSIS — L821 Other seborrheic keratosis: Secondary | ICD-10-CM

## 2022-02-26 DIAGNOSIS — I8393 Asymptomatic varicose veins of bilateral lower extremities: Secondary | ICD-10-CM

## 2022-02-26 DIAGNOSIS — L570 Actinic keratosis: Secondary | ICD-10-CM | POA: Diagnosis not present

## 2022-02-26 DIAGNOSIS — L578 Other skin changes due to chronic exposure to nonionizing radiation: Secondary | ICD-10-CM

## 2022-02-26 DIAGNOSIS — D1800 Hemangioma unspecified site: Secondary | ICD-10-CM

## 2022-02-26 DIAGNOSIS — L814 Other melanin hyperpigmentation: Secondary | ICD-10-CM

## 2022-02-26 DIAGNOSIS — D229 Melanocytic nevi, unspecified: Secondary | ICD-10-CM

## 2022-02-26 DIAGNOSIS — B009 Herpesviral infection, unspecified: Secondary | ICD-10-CM

## 2022-02-26 MED ORDER — VALACYCLOVIR HCL 1 G PO TABS: ORAL_TABLET | ORAL | 11 refills | Status: DC

## 2022-02-26 NOTE — Progress Notes (Signed)
? ?New Patient Visit ? ?Subjective  ?Darlene Spencer is a 61 y.o. female who presents for the following: Annual Exam (Here for skin cancer screening. No personal Hx of skin cancer. Check spot on left lower leg. Raised, crusty). ? ?The patient presents for Total-Body Skin Exam (TBSE) for skin cancer screening and mole check.  The patient has spots, moles and lesions to be evaluated, some may be new or changing and the patient has concerns that these could be cancer. ? ? ? ?Review of Systems: No other skin or systemic complaints except as noted in HPI or Assessment and Plan. ? ?Objective  ?Well appearing patient in no apparent distress; mood and affect are within normal limits. ? ?A full examination was performed including scalp, head, eyes, ears, nose, lips, neck, chest, axillae, abdomen, back, buttocks, bilateral upper extremities, bilateral lower extremities, hands, feet, fingers, toes, fingernails, and toenails. All findings within normal limits unless otherwise noted below. ? ?Left Pretibia ?Erythematous keratotic or waxy stuck-on papule ? ?Right medial buttock ?Tan-brown regular macule ? ?Left Malar Cheek x1, left zygoma x1, left mid jaw x1, right malar cheek x1 (4) ?Erythematous thin papules/macules with gritty scale.  ? ?Lips ?Clear today.pt has h/o cold sores, needs rf medication ? ? ?Assessment & Plan  ?Inflamed seborrheic keratosis ?Left Pretibia ? ?Patient deferred treatment at this time. RTC for LN2 if becomes more bothersome.  ? ?Nevus ?Right medial buttock ? ?Benign-appearing.  Observation.  Call clinic for new or changing lesions.  Recommend daily use of broad spectrum spf 30+ sunscreen to sun-exposed areas.   ? ?AK (actinic keratosis) (4) ?Left Malar Cheek x1, left zygoma x1, left mid jaw x1, right malar cheek x1 ? ?Actinic keratoses are precancerous spots that appear secondary to cumulative UV radiation exposure/sun exposure over time. They are chronic with expected duration over 1 year. A portion of  actinic keratoses will progress to squamous cell carcinoma of the skin. It is not possible to reliably predict which spots will progress to skin cancer and so treatment is recommended to prevent development of skin cancer. ? ?Recommend daily broad spectrum sunscreen SPF 30+ to sun-exposed areas, reapply every 2 hours as needed.  ?Recommend staying in the shade or wearing long sleeves, sun glasses (UVA+UVB protection) and wide brim hats (4-inch brim around the entire circumference of the hat). ?Call for new or changing lesions. ? ?Discussed PDT Tx: Because she has insurance, she would have to pay the 100% fee (no "self pay" discount is allowed) - the estimated price is $1,050.00. ?Discussed topical chemotherapy cream, would be more cost effective.  ? ?Destruction of lesion - Left Malar Cheek x1, left zygoma x1, left mid jaw x1, right malar cheek x1 ? ?Destruction method: cryotherapy   ?Informed consent: discussed and consent obtained   ?Lesion destroyed using liquid nitrogen: Yes   ?Region frozen until ice ball extended beyond lesion: Yes   ?Outcome: patient tolerated procedure well with no complications   ?Post-procedure details: wound care instructions given   ?Additional details:  Prior to procedure, discussed risks of blister formation, small wound, skin dyspigmentation, or rare scar following cryotherapy. Recommend Vaseline ointment to treated areas while healing.  ? ?Herpes simplex ?Lips ? ?Start Valacyclovir 1 gm take 2 pills at first sign of symptoms, repeat in 12 hours for 1 day dose. Repeat as needed for flares.  #30, 11Rf ? ?Herpes Simplex Virus = Cold Sores = Fever Blisters is a chronic recurring blistering; scabbing sore-producing viral infection that is  recurrent usually in the same area triggered by stress, sun/UV exposure and trauma.  It is infectious and can be spread from person to person by direct contact.  It is not curable, but is treatable with topical and oral medication.  ? ?valACYclovir  (VALTREX) 1000 MG tablet - Lips ?Take 2 pills at first sign of symptoms, repeat in 12 hours for 1 day dose. Repeat PRN flares. ? ? ?Lentigines ?- Scattered tan macules ?- Due to sun exposure ?- Benign-appearing, observe ?- Recommend daily broad spectrum sunscreen SPF 30+ to sun-exposed areas, reapply every 2 hours as needed. ?- Call for any changes ? ?Seborrheic Keratoses ?- Stuck-on, waxy, tan-brown papules and/or plaques  ?- Benign-appearing ?- Discussed benign etiology and prognosis. ?- Observe ?- Call for any changes ? ?Melanocytic Nevi ?- Tan-brown and/or pink-flesh-colored symmetric macules and papules ?- Benign appearing on exam today ?- Observation ?- Call clinic for new or changing moles ?- Recommend daily use of broad spectrum spf 30+ sunscreen to sun-exposed areas.  ? ?Hemangiomas ?- Red papules ?- Discussed benign nature ?- Observe ?- Call for any changes ? ?Actinic Damage - Severe, confluent actinic changes with pre-cancerous actinic keratoses  ?- Severe, chronic, not at goal, secondary to cumulative UV radiation exposure over time ?- diffuse scaly erythematous macules and papules with underlying dyspigmentation ?- Discussed Prescription "Field Treatment" for Severe, Chronic Confluent Actinic Changes with Pre-Cancerous Actinic Keratoses ?Field treatment involves treatment of an entire area of skin that has confluent Actinic Changes (Sun/ Ultraviolet light damage) and PreCancerous Actinic Keratoses by method of PhotoDynamic Therapy (PDT) and/or prescription Topical Chemotherapy agents such as 5-fluorouracil, 5-fluorouracil/calcipotriene, and/or imiquimod.  The purpose is to decrease the number of clinically evident and subclinical PreCancerous lesions to prevent progression to development of skin cancer by chemically destroying early precancer changes that may or may not be visible.  It has been shown to reduce the risk of developing skin cancer in the treated area. As a result of treatment, redness,  scaling, crusting, and open sores may occur during treatment course. One or more than one of these methods may be used and may have to be used several times to control, suppress and eliminate the PreCancerous changes. Discussed treatment course, expected reaction, and possible side effects. ?- Recommend daily broad spectrum sunscreen SPF 30+ to sun-exposed areas, reapply every 2 hours as needed.  ?- Staying in the shade or wearing long sleeves, sun glasses (UVA+UVB protection) and wide brim hats (4-inch brim around the entire circumference of the hat) are also recommended. ?- Call for new or changing lesions.  ?- Pt may call to schedule for PDT to face x 2, 1 month apart. ? ?Skin cancer screening performed today. ? ?Varicose Veins/Spider Veins ?- Dilated blue, purple or red veins at the lower extremities ?- Reassured ?- Smaller vessels can be treated by sclerotherapy (a procedure to inject a medicine into the veins to make them disappear) if desired, but the treatment is not covered by insurance. Larger vessels may be covered if symptomatic and we would refer to vascular surgeon if treatment desired. ? ? ? ?Return in about 1 year (around 02/27/2023) for TBSE. ? ?I, Emelia Salisbury, CMA, am acting as scribe for Brendolyn Patty, MD. ? ?Documentation: I have reviewed the above documentation for accuracy and completeness, and I agree with the above. ? ?Brendolyn Patty MD  ? ? ?

## 2022-02-26 NOTE — Patient Instructions (Addendum)
Cryotherapy Aftercare ? ?Wash gently with soap and water everyday.   ?Apply Vaseline and Band-Aid daily until healed.  ? ?Prior to procedure, discussed risks of blister formation, small wound, skin dyspigmentation, or rare scar following cryotherapy. Recommend Vaseline ointment to treated areas while healing.  ? ?Start Valacyclovir 1 gm take 2 pills at first sign of symptoms, repeat in 12 hours for 1 day dose. Repeat as needed for flares. ? ? ?Recommend daily broad spectrum sunscreen SPF 30+ to sun-exposed areas, reapply every 2 hours as needed. Call for new or changing lesions.  ?Staying in the shade or wearing long sleeves, sun glasses (UVA+UVB protection) and wide brim hats (4-inch brim around the entire circumference of the hat) are also recommended for sun protection.  ? ? ?5-Fluorouracil/Calcipotriene Patient Education  ? ?Actinic keratoses are the dry, red scaly spots on the skin caused by sun damage. A portion of these spots can turn into skin cancer with time, and treating them can help prevent development of skin cancer.  ? ?Treatment of these spots requires removal of the defective skin cells. There are various ways to remove actinic keratoses, including freezing with liquid nitrogen, treatment with creams, or treatment with a blue light procedure in the office.  ? ?5-fluorouracil cream is a topical cream used to treat actinic keratoses. It works by interfering with the growth of abnormal fast-growing skin cells, such as actinic keratoses. These cells peel off and are replaced by healthy ones.  ? ?5-fluorouracil/calcipotriene is a combination of the 5-fluorouracil cream with a vitamin D analog cream called calcipotriene. The calcipotriene alone does not treat actinic keratoses. However, when it is combined with 5-fluorouracil, it helps the 5-fluorouracil treat the actinic keratoses much faster so that the same results can be achieved with a much shorter treatment time. ? ?INSTRUCTIONS FOR  5-FLUOROURACIL/CALCIPOTRIENE CREAM:  ? ?5-fluorouracil/calcipotriene cream typically only needs to be used for 4-7 days. A thin layer should be applied twice a day to the treatment areas recommended by your physician.  ? ?If your physician prescribed you separate tubes of 5-fluourouracil and calcipotriene, apply a thin layer of 5-fluorouracil followed by a thin layer of calcipotriene.  ? ?Avoid contact with your eyes, nostrils, and mouth. Do not use 5-fluorouracil/calcipotriene cream on infected or open wounds.  ? ?You will develop redness, irritation and some crusting at areas where you have pre-cancer damage/actinic keratoses. IF YOU DEVELOP PAIN, BLEEDING, OR SIGNIFICANT CRUSTING, STOP THE TREATMENT EARLY - you have already gotten a good response and the actinic keratoses should clear up well. ? ?Wash your hands after applying 5-fluorouracil 5% cream on your skin.  ? ?A moisturizer or sunscreen with a minimum SPF 30 should be applied each morning.  ? ?Once you have finished the treatment, you can apply a thin layer of Vaseline twice a day to irritated areas to soothe and calm the areas more quickly. If you experience significant discomfort, contact your physician. ? ?For some patients it is necessary to repeat the treatment for best results. ? ?SIDE EFFECTS: When using 5-fluorouracil/calcipotriene cream, you may have mild irritation, such as redness, dryness, swelling, or a mild burning sensation. This usually resolves within 2 weeks. The more actinic keratoses you have, the more redness and inflammation you can expect during treatment. Eye irritation has been reported rarely. If this occurs, please let us know.  ?If you have any trouble using this cream, please call the office. If you have any other questions about this information, please do not hesitate  to ask me before you leave the office. ? ? ? ?Photodynamic Therapy/Blue Light Therapy ? ?Actinic keratoses are the dry, red scaly spots on the skin caused by  sun damage. A portion of these spots can turn into skin cancer with time, and treating them can help prevent development of skin cancer.  ? ?Treatment of these spots requires removal of the defective skin cells. There are various ways to remove actinic keratoses, including freezing with liquid nitrogen, treatment with creams, or treatment with a blue light procedure in the office.  ? ?Photodynamic Therapy (PDT), also known as "blue light therapy" is an in office procedure used to treat actinic keratoses. It works by targeting precancerous cells. After treatment, these cells peel off and are replaced by healthy ones.  ? ?For your phototherapy appointment, you will have two appointments on the day of your treatment. The first appointment will be to apply a cream to the treatment area. You will leave this cream on for 1-2 hours depending on the area being treated. The second appointment will be to shine a blue light on the area for 16 minutes to kill off the precancer cells. It is common to experience a burning sensation during the treatment. ? ?After your treatment, it will be important to keep the treated areas of skin out of the sun completely for 48-72 hours (2-3 days) to prevent having a reaction.  ? ?Common side effects include: ?- Burning or stinging, which may be severe and can last up to 24-72 hours after your treatment ?- Scaling and crusting which may last up to 2 weeks ?- Redness, swelling and/or peeling which can last up to 4 weeks ? ?To Care for Your Skin After PDT/Blue Light Therapy: ?- Wash with soap, water and shampoo as normal. ?- If needed, you can use cold compresses (e.g. ice packs) for comfort ?- If okay with your primary care doctor, you may use analgesics such as acetaminophen (tylenol) every 4-6 hours, not to exceed recommended dose ?- You may apply Cerave Healing Ointment, Vaseline or Aquaphor as needed ?- If you have a lot of swelling you may take a Benadryl to help with this (this may cause  drowsiness), not to exceed recommended dose. This may increase the risk of falls in people over 65 and may slow reaction time while driving, so it is not recommended to take before driving or operating machinery. ?- Sun Precautions - Wear a wide brim hat for the next week if outside  ?- Wear a sunblock with zinc or titanium dioxide at least SPF 50 daily ? ?If you have any questions or concerns, please call the office and ask to speak with a nurse.  ? ?--------------------------------------------------------------------------------------------------------------  ? ? ?Seborrheic Keratosis ? ?What causes seborrheic keratoses? ?Seborrheic keratoses are harmless, common skin growths that first appear during adult life.  As time goes by, more growths appear.  Some people may develop a large number of them.  Seborrheic keratoses appear on both covered and uncovered body parts.  They are not caused by sunlight.  The tendency to develop seborrheic keratoses can be inherited.  They vary in color from skin-colored to gray, brown, or even black.  They can be either smooth or have a rough, warty surface.   ?Seborrheic keratoses are superficial and look as if they were stuck on the skin.  Under the microscope this type of keratosis looks like layers upon layers of skin.  That is why at times the top layer may seem  to fall off, but the rest of the growth remains and re-grows.   ? ?Treatment ?Seborrheic keratoses do not need to be treated, but can easily be removed in the office.  Seborrheic keratoses often cause symptoms when they rub on clothing or jewelry.  Lesions can be in the way of shaving.  If they become inflamed, they can cause itching, soreness, or burning.  Removal of a seborrheic keratosis can be accomplished by freezing, burning, or surgery. ?If any spot bleeds, scabs, or grows rapidly, please return to have it checked, as these can be an indication of a skin cancer. ? ?Melanoma ABCDEs ? ?Melanoma is the most  dangerous type of skin cancer, and is the leading cause of death from skin disease.  You are more likely to develop melanoma if you: ?Have light-colored skin, light-colored eyes, or red or blond hair ?Spend a lot of time in t

## 2022-03-03 ENCOUNTER — Other Ambulatory Visit: Payer: Self-pay

## 2022-03-03 MED ORDER — FLUOROURACIL 5 % EX CREA: TOPICAL_CREAM | Freq: Two times a day (BID) | CUTANEOUS | 1 refills | Status: DC

## 2022-03-03 NOTE — Progress Notes (Signed)
5FU/Calcipotriene bid for 4-7 days to face/sh ?

## 2022-04-04 ENCOUNTER — Other Ambulatory Visit: Payer: Self-pay | Admitting: Family Medicine

## 2022-04-04 DIAGNOSIS — E782 Mixed hyperlipidemia: Secondary | ICD-10-CM

## 2022-04-11 DIAGNOSIS — G43719 Chronic migraine without aura, intractable, without status migrainosus: Secondary | ICD-10-CM | POA: Diagnosis not present

## 2022-04-11 DIAGNOSIS — G43019 Migraine without aura, intractable, without status migrainosus: Secondary | ICD-10-CM | POA: Diagnosis not present

## 2022-05-27 ENCOUNTER — Ambulatory Visit
Admission: RE | Admit: 2022-05-27 | Discharge: 2022-05-27 | Disposition: A | Discharge status: Home / Self Care | Payer: BC Managed Care – PPO | Source: Ambulatory Visit | Attending: Family Medicine | Admitting: Family Medicine

## 2022-05-27 DIAGNOSIS — Z1231 Encounter for screening mammogram for malignant neoplasm of breast: Secondary | ICD-10-CM

## 2022-06-11 IMAGING — CT CT NECK W/ CM
3 of 5 series · 11 of 33 positions shown, 13 images · IV contrast (omnipaque)
Comparison: [HOSPITAL] brain MRI 03/14/2008.

CLINICAL DATA: 58-year-old female with intermittent hoarseness,
dysphagia for 6 months. Intermittent globus sensation.

EXAM:
CT NECK WITH CONTRAST
TECHNIQUE: Multidetector CT imaging of the neck was performed using the
standard protocol following the bolus administration of intravenous
contrast.
CONTRAST:  75mL OMNIPAQUE IOHEXOL 300 MG/ML  SOLN

[Series 4: coronal neck neck (person_name) 2.00 cor · coronal · 0.53mm/px · 3 of 106 slices shown]
[im 31/106  bone]
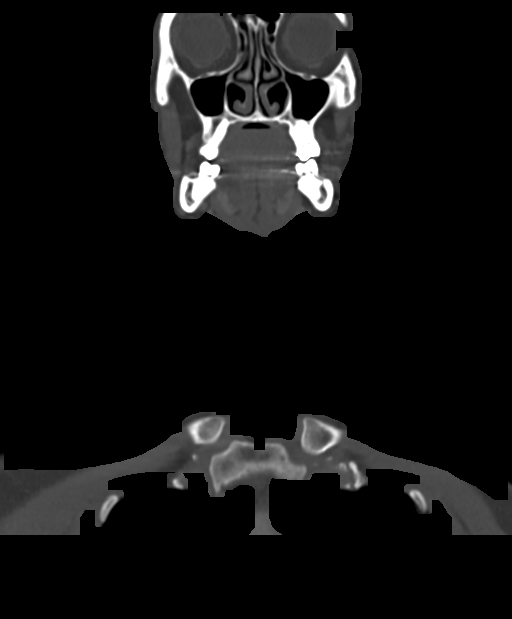
[im 46/106  bone]
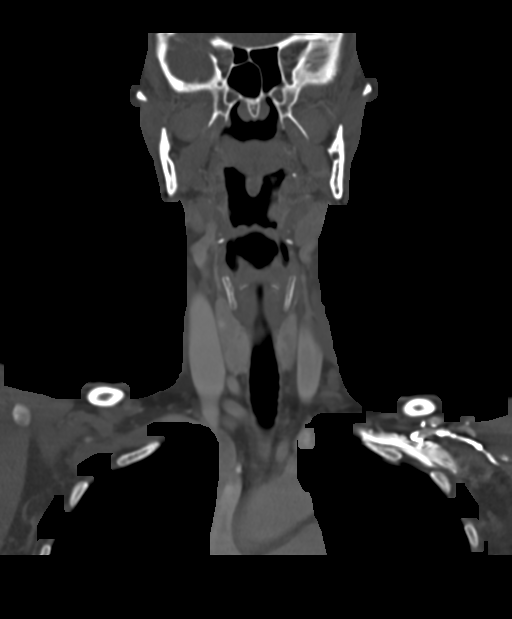
[im 61/106  bone]
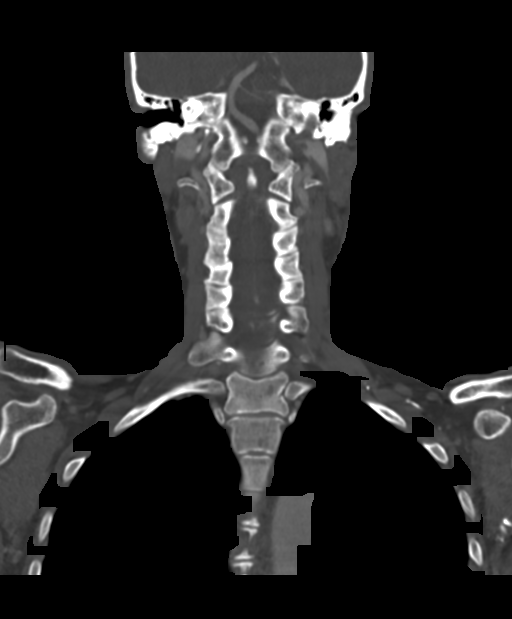

[Series 6: sagittal neck neck (person_name) 2.00 sag · sagittal · 0.42mm/px · 5 of 134 slices shown, 6 images]
[im 45/134  bone]
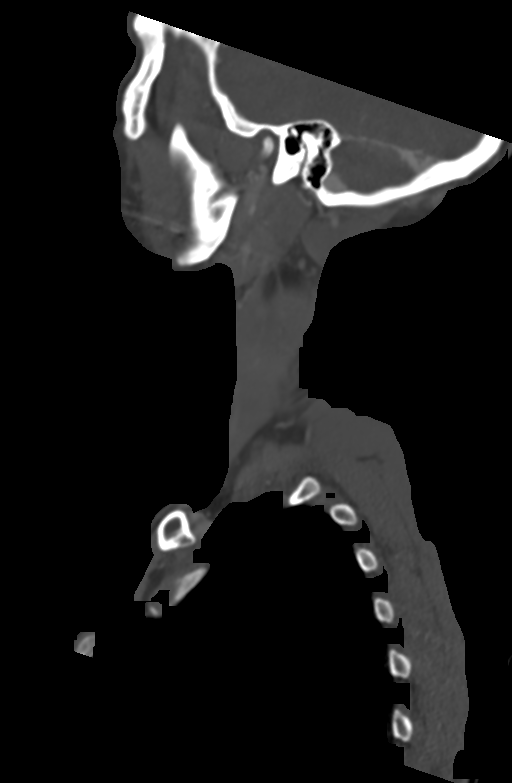
[im 56/134  bone]
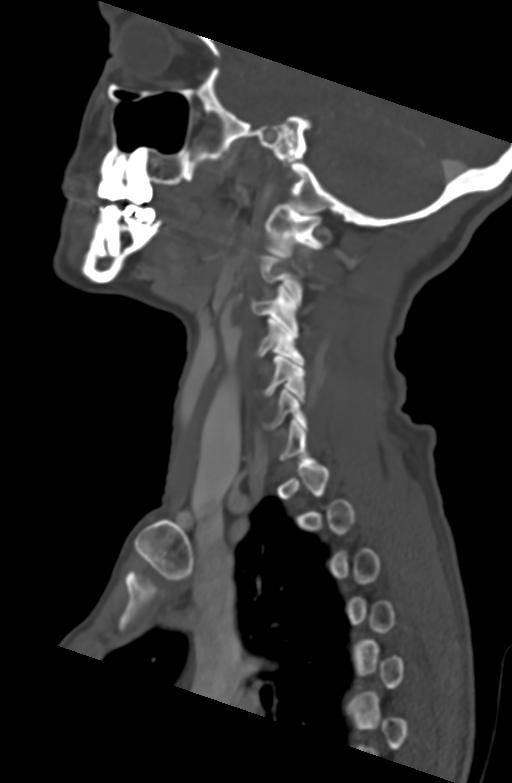
[im 67/134  soft-tissue]
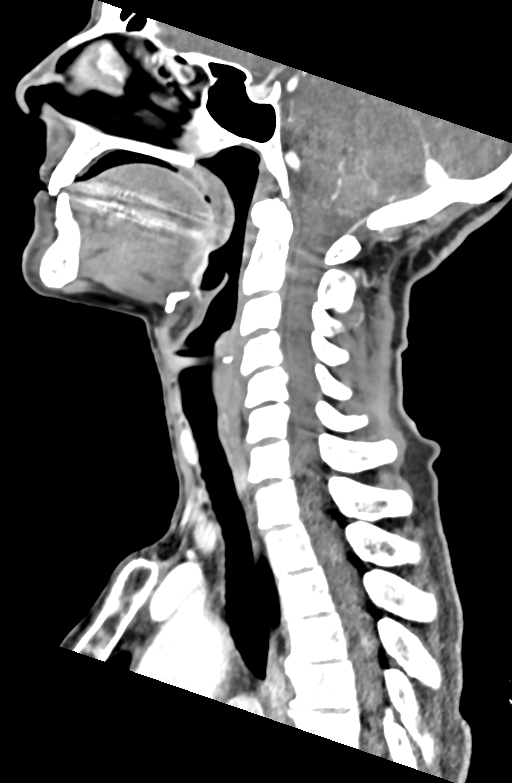
[im 67/134  bone]
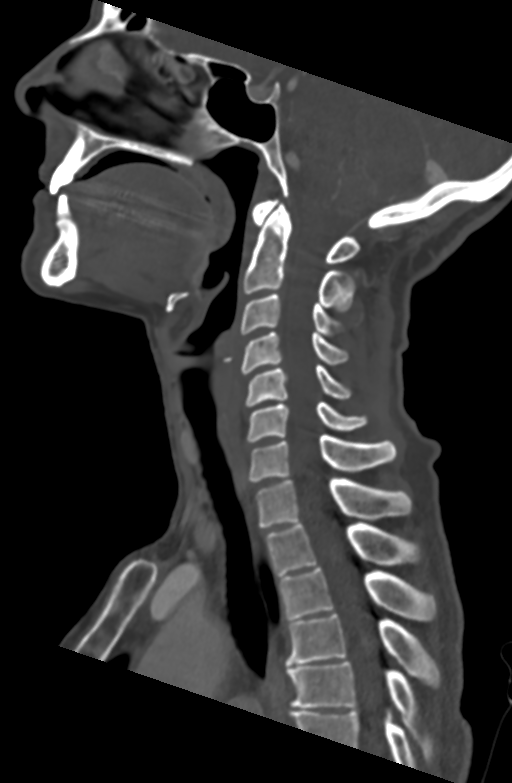
[im 78/134  bone]
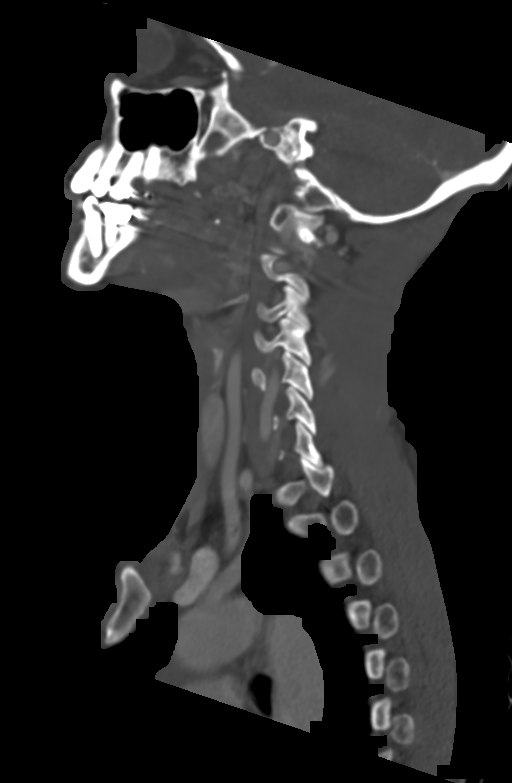
[im 89/134  bone]
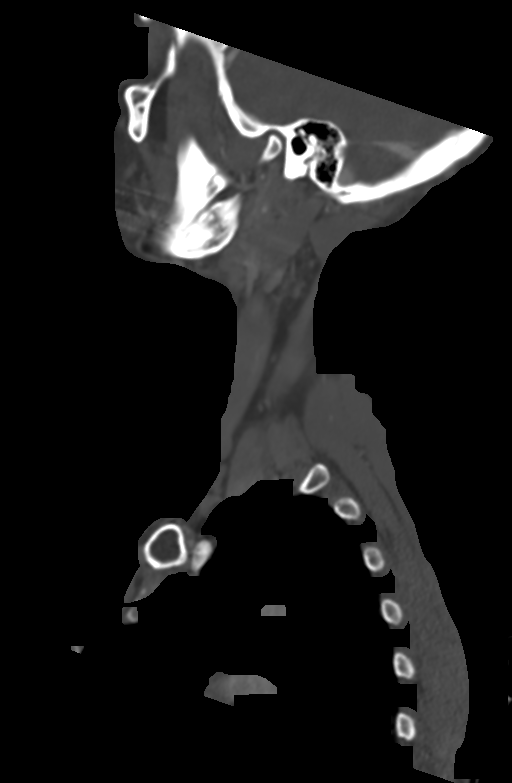

[Series 8: ax oropharynx neck neck (person_name) 2.00 ax · axial · 0.42mm/px · z∈[-751,-592]mm · 3 of 157 slices shown, 4 images]
[im 40/157  soft-tissue]
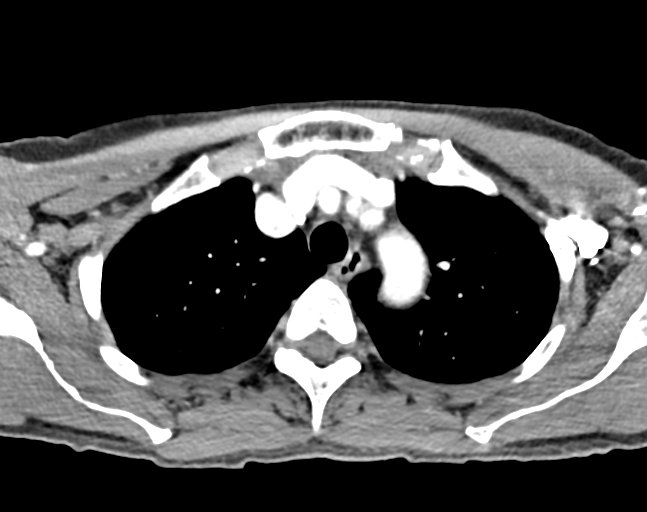
[im 40/157  bone]
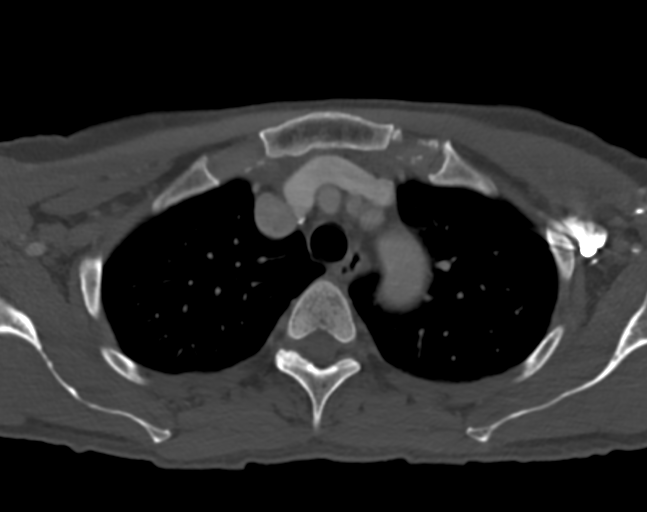
[im 79/157  bone]
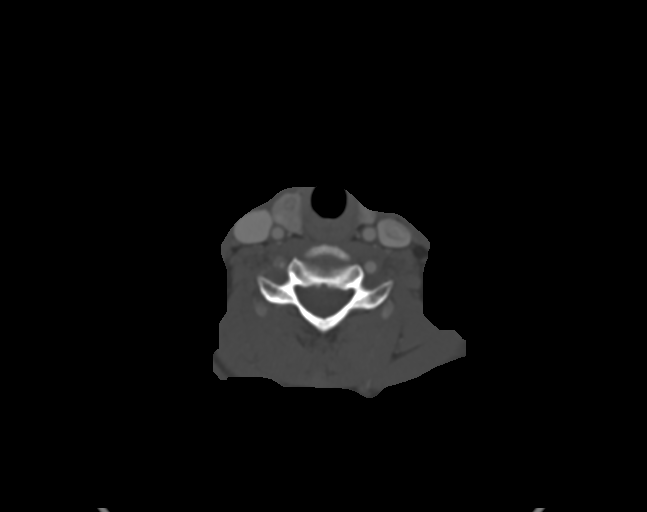
[im 118/157  bone]
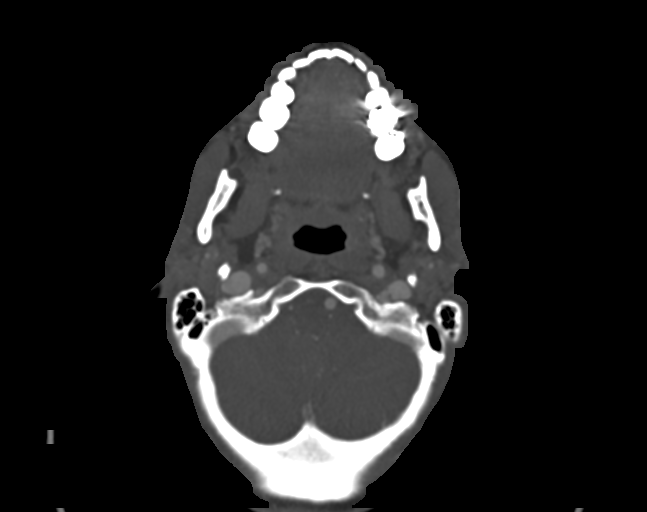

[11 of 33 positions shown; findings below may reference images not displayed]

FINDINGS: Pharynx and larynx: Laryngeal and pharyngeal soft tissue contours
appear normal. The parapharyngeal and retropharyngeal spaces appear
normal.

Salivary glands: Sublingual space, sublingual glands, submandibular
glands, and parotid glands appear symmetric and within normal
limits.

Thyroid: Multinodular thyroid parenchyma. Numerous subcentimeter
bilateral thyroid nodules, the largest in the right lobe estimated
at about 10 mm. No follow-up indicated; these do not meet consensus
criteria for ultrasound (ref: [HOSPITAL]. [DATE]):

Lymph nodes: Negative. No cervical lymphadenopathy. The largest
lymph nodes are 5 mm short axis at the level 2 stations. No
heterogeneous nodes.

Vascular: Major vascular structures in the neck and at the skull
base are patent. Dominant left vertebral artery. No significant
atherosclerosis identified.

Limited intracranial: Negative.

Visualized orbits: Postoperative changes to the left globe,
otherwise negative.

Mastoids and visualized paranasal sinuses: Clear.

Skeleton: Negative.

Upper chest: Small calcified granulomas in the right upper lobe.
Mild tortuosity of the aortic arch. Otherwise negative visible
superior mediastinum. Cervical and visible thoracic esophagus are
unremarkable.
IMPRESSION: 1. Essentially negative Neck CT.  No explanation for symptoms.
2. Multiple small thyroid nodules, with none meeting consensus
criteria for imaging follow-up.

## 2022-10-23 DIAGNOSIS — G43019 Migraine without aura, intractable, without status migrainosus: Secondary | ICD-10-CM | POA: Diagnosis not present

## 2022-10-27 ENCOUNTER — Other Ambulatory Visit: Payer: Self-pay

## 2022-10-27 DIAGNOSIS — F5101 Primary insomnia: Secondary | ICD-10-CM

## 2022-10-27 NOTE — Telephone Encounter (Signed)
LAST APPOINTMENT DATE: Visit date not found   NEXT APPOINTMENT DATE: 03/17/2023 TOC with Dr. Diona Browner     LAST REFILL: 01/24/2022  QTY: 30

## 2022-10-28 MED ORDER — ZOLPIDEM TARTRATE 10 MG PO TABS: 10.0000 mg | ORAL_TABLET | Freq: Every evening | ORAL | 0 refills | Status: DC | PRN

## 2022-12-18 DIAGNOSIS — G43719 Chronic migraine without aura, intractable, without status migrainosus: Secondary | ICD-10-CM | POA: Diagnosis not present

## 2023-03-03 ENCOUNTER — Encounter: Payer: BC Managed Care – PPO | Admitting: Dermatology

## 2023-03-16 ENCOUNTER — Other Ambulatory Visit: Payer: Self-pay | Admitting: Family Medicine

## 2023-03-16 DIAGNOSIS — Z1231 Encounter for screening mammogram for malignant neoplasm of breast: Secondary | ICD-10-CM

## 2023-03-17 ENCOUNTER — Ambulatory Visit (INDEPENDENT_AMBULATORY_CARE_PROVIDER_SITE_OTHER): Payer: BC Managed Care – PPO | Admitting: Family Medicine

## 2023-03-17 ENCOUNTER — Encounter: Payer: Self-pay | Admitting: Family Medicine

## 2023-03-17 VITALS — BP 102/72 | HR 48 | Temp 97.5°F | Ht 63.5 in | Wt 123.0 lb

## 2023-03-17 DIAGNOSIS — E782 Mixed hyperlipidemia: Secondary | ICD-10-CM | POA: Diagnosis not present

## 2023-03-17 DIAGNOSIS — D696 Thrombocytopenia, unspecified: Secondary | ICD-10-CM

## 2023-03-17 DIAGNOSIS — R0981 Nasal congestion: Secondary | ICD-10-CM | POA: Insufficient documentation

## 2023-03-17 DIAGNOSIS — R739 Hyperglycemia, unspecified: Secondary | ICD-10-CM | POA: Diagnosis not present

## 2023-03-17 DIAGNOSIS — F5101 Primary insomnia: Secondary | ICD-10-CM | POA: Diagnosis not present

## 2023-03-17 DIAGNOSIS — G43001 Migraine without aura, not intractable, with status migrainosus: Secondary | ICD-10-CM

## 2023-03-17 DIAGNOSIS — Z8249 Family history of ischemic heart disease and other diseases of the circulatory system: Secondary | ICD-10-CM

## 2023-03-17 DIAGNOSIS — M8580 Other specified disorders of bone density and structure, unspecified site: Secondary | ICD-10-CM

## 2023-03-17 LAB — CBC WITH DIFFERENTIAL/PLATELET
Basophils Absolute: 0 10*3/uL (ref 0.0–0.1)
Basophils Relative: 0.4 % (ref 0.0–3.0)
Eosinophils Absolute: 0.1 10*3/uL (ref 0.0–0.7)
Eosinophils Relative: 1.2 % (ref 0.0–5.0)
HCT: 42.1 % (ref 36.0–46.0)
Hemoglobin: 14 g/dL (ref 12.0–15.0)
Lymphocytes Relative: 34.9 % (ref 12.0–46.0)
Lymphs Abs: 1.6 10*3/uL (ref 0.7–4.0)
MCHC: 33.2 g/dL (ref 30.0–36.0)
MCV: 95.7 fl (ref 78.0–100.0)
Monocytes Absolute: 0.4 10*3/uL (ref 0.1–1.0)
Monocytes Relative: 8.9 % (ref 3.0–12.0)
Neutro Abs: 2.5 10*3/uL (ref 1.4–7.7)
Neutrophils Relative %: 54.6 % (ref 43.0–77.0)
Platelets: 141 10*3/uL — ABNORMAL LOW (ref 150.0–400.0)
RBC: 4.4 Mil/uL (ref 3.87–5.11)
RDW: 13.2 % (ref 11.5–15.5)
WBC: 4.5 10*3/uL (ref 4.0–10.5)

## 2023-03-17 LAB — COMPREHENSIVE METABOLIC PANEL
ALT: 13 U/L (ref 0–35)
AST: 17 U/L (ref 0–37)
Albumin: 4.3 g/dL (ref 3.5–5.2)
Alkaline Phosphatase: 70 U/L (ref 39–117)
BUN: 14 mg/dL (ref 6–23)
CO2: 27 mEq/L (ref 19–32)
Calcium: 9.4 mg/dL (ref 8.4–10.5)
Chloride: 107 mEq/L (ref 96–112)
Creatinine, Ser: 0.89 mg/dL (ref 0.40–1.20)
GFR: 69.03 mL/min (ref >60.00)
Glucose, Bld: 93 mg/dL (ref 70–99)
Potassium: 4 mEq/L (ref 3.5–5.1)
Sodium: 141 mEq/L (ref 135–145)
Total Bilirubin: 0.6 mg/dL (ref 0.2–1.2)
Total Protein: 7.1 g/dL (ref 6.0–8.3)

## 2023-03-17 LAB — LIPID PANEL
Cholesterol: 198 mg/dL (ref 0–200)
HDL: 78 mg/dL (ref >39.00)
LDL Cholesterol: 102 mg/dL — ABNORMAL HIGH (ref 0–99)
NonHDL: 120.36
Total CHOL/HDL Ratio: 3
Triglycerides: 90 mg/dL (ref 0.0–149.0)
VLDL: 18 mg/dL (ref 0.0–40.0)

## 2023-03-17 LAB — HEMOGLOBIN A1C: Hgb A1c MFr Bld: 5.7 % (ref 4.6–6.5)

## 2023-03-17 MED ORDER — ATORVASTATIN CALCIUM 10 MG PO TABS: 10.0000 mg | ORAL_TABLET | Freq: Every day | ORAL | 3 refills | Status: DC

## 2023-03-17 MED ORDER — ZOLPIDEM TARTRATE 10 MG PO TABS: 10.0000 mg | ORAL_TABLET | Freq: Every evening | ORAL | 0 refills | Status: DC | PRN

## 2023-03-17 NOTE — Assessment & Plan Note (Signed)
Due for re-eval. 

## 2023-03-17 NOTE — Assessment & Plan Note (Signed)
Stable, chronic.  Continue current medication.   Atorvastatin 10 mg daily 

## 2023-03-17 NOTE — Assessment & Plan Note (Signed)
Father history of MI at a relatively young age.     Calcium CT score 2014 0  Stress tesintg in 2019 low risk  Cardiology in past Dr. Herbie [place].

## 2023-03-17 NOTE — Assessment & Plan Note (Signed)
No current sign of bacterial superinfection.  Possible allergic response. Can try Flonase 2 sprays per nostril at bedtime and nasal saline spray in the morning for chronic nasal congestion

## 2023-03-17 NOTE — Patient Instructions (Addendum)
Call to set up bone density along with mammogram in 05/2023.  Please stop at the lab to have labs drawn. Can try Flonase 2 sprays per nostril at bedtime and nasal saline spray in the morning for chronic nasal congestion

## 2023-03-17 NOTE — Assessment & Plan Note (Addendum)
Chronic, well-controlled.  Using sumatriptan for rescue. Topamax 100 mg p.o. daily for prophylaxis.. rarely having migraine Followed by neurology,  [facility].

## 2023-03-17 NOTE — Assessment & Plan Note (Signed)
Stable, chornic.  Due for re-evaluation.

## 2023-03-17 NOTE — Progress Notes (Signed)
Patient ID: Darlene Spencer, female    DOB: [birth year], 62 y.o.   MRN: [id number]  This visit was conducted in person.  BP 102/72   Pulse (!) 48   Temp (!) 97.5 F (36.4 C) (Temporal)   Ht 5' 3.5" (1.613 m)   Wt 123 lb (55.8 kg)   LMP 11/10/1996   SpO2 97%   BMI 21.45 kg/m    CC:  Chief Complaint  Patient presents with   Establish Care    TOC from Dr. Selena Batten    Nasal Congestion    X 5 weeks.     Subjective:   HPI: Darlene Spencer is a 62 y.o. female presenting on 03/17/2023 for Establish Care (TOC from Dr. Selena Batten ) and Nasal Congestion (X 5 weeks. )  Previous PCP: Selena Batten Last physical 01/24/2022 She is due for CPX today.   She has been having runny nose, nasal congestion x 5 weeks.  No fever, no SOB,  Occ wheezing  Wears hearing aides... some chronic discomfort.   Some sinus pressure.  Has tried cold, allergy med with decongestant.. makes her feel worse.   Elevated Cholesterol: Due for reevaluation on atorvastatin 10 mg p.o. daily Lab Results  Component Value Date   CHOL 158 01/24/2022   HDL 74.80 01/24/2022   LDLCALC 72 01/24/2022   LDLDIRECT 139.1 05/09/2013   TRIG 54.0 01/24/2022   CHOLHDL 2 01/24/2022  Using medications without problems: Muscle aches:  Diet compliance: Exercise: Other complaints: Family history of premature coronary artery disease  Migraine: On Topamax 100 mg p.o. daily for prophylaxis . Chronic insomnia: Well-controlled on Ambien 10 mg p.o. nightly   History of thrombocytopenia/leukopenia: When last checked February 13, 2022 felt reactive per pathology smear.  Relevant past medical, surgical, family and social history reviewed and updated as indicated. Interim medical history since our last visit reviewed. Allergies and medications reviewed and updated. Outpatient Medications Prior to Visit  Medication Sig Dispense Refill   Calcium Carbonate-Vitamin D 600-400 MG-UNIT tablet Take 1 tablet by mouth daily at 6 PM.     FIBER PO Take by mouth.  Fiber Well Sugar -Free Gummies-Take 2 daily     fish oil-omega-3 fatty acids 1000 MG capsule Take 1 g by mouth daily.     Multiple Vitamin (MULTIVITAMIN) tablet Take 1 tablet by mouth. Women's MVI-Take 2 chewables daily     topiramate (TOPAMAX) 100 MG tablet Take 100 mg by mouth daily at 6 PM.     valACYclovir (VALTREX) 1000 MG tablet Take 2 pills at first sign of symptoms, repeat in 12 hours for 1 day dose. Repeat PRN flares. 30 tablet 11   aspirin EC 81 MG tablet Take 81 mg by mouth daily.     atorvastatin (LIPITOR) 10 MG tablet TAKE 1 TABLET BY MOUTH EVERY DAY 90 tablet 3   zolpidem (AMBIEN) 10 MG tablet Take 1 tablet (10 mg total) by mouth at bedtime as needed. for sleep 30 tablet 0   fluorouracil (EFUDEX) 5 % cream Apply topically 2 (two) times daily. Bid to face for 4 to 7 days 15 g 1   Facility-Administered Medications Prior to Visit  Medication Dose Route Frequency Provider Last Rate Last Admin   0.9 %  sodium chloride infusion  500 mL Intravenous Continuous Iva Boop, MD         Per HPI unless specifically indicated in ROS section below Review of Systems  Constitutional:  Negative for fatigue and fever.  HENT:  Negative for congestion.   Eyes:  Negative for pain.  Respiratory:  Negative for cough and shortness of breath.   Cardiovascular:  Negative for chest pain, palpitations and leg swelling.  Gastrointestinal:  Negative for abdominal pain.  Genitourinary:  Negative for dysuria and vaginal bleeding.  Musculoskeletal:  Negative for back pain.  Neurological:  Negative for syncope, light-headedness and headaches.  Psychiatric/Behavioral:  Negative for dysphoric mood.    Objective:  BP 102/72   Pulse (!) 48   Temp (!) 97.5 F (36.4 C) (Temporal)   Ht 5' 3.5" (1.613 m)   Wt 123 lb (55.8 kg)   LMP 11/10/1996   SpO2 97%   BMI 21.45 kg/m   Wt Readings from Last 3 Encounters:  03/17/23 123 lb (55.8 kg)  01/24/22 121 lb 5 oz (55 kg)  01/21/21 119 lb (54 kg)      Wt  Readings from Last 3 Encounters:  03/17/23 123 lb (55.8 kg)  01/24/22 121 lb 5 oz (55 kg)  01/21/21 119 lb (54 kg)    Physical Exam Constitutional:      General: She is not in acute distress.    Appearance: Normal appearance. She is well-developed. She is not ill-appearing or toxic-appearing.  HENT:     Head: Normocephalic.     Right Ear: Hearing, tympanic membrane, ear canal and external ear normal. Tympanic membrane is not erythematous, retracted or bulging.     Left Ear: Hearing, tympanic membrane, ear canal and external ear normal. Tympanic membrane is not erythematous, retracted or bulging.     Nose: No mucosal edema or rhinorrhea.     Right Sinus: No maxillary sinus tenderness or frontal sinus tenderness.     Left Sinus: No maxillary sinus tenderness or frontal sinus tenderness.     Mouth/Throat:     Pharynx: Uvula midline.  Eyes:     General: Lids are normal. Lids are everted, no foreign bodies appreciated.     Conjunctiva/sclera: Conjunctivae normal.     Pupils: Pupils are equal, round, and reactive to light.  Neck:     Thyroid: No thyroid mass or thyromegaly.     Vascular: No carotid bruit.     Trachea: Trachea normal.  Cardiovascular:     Rate and Rhythm: Normal rate and regular rhythm.     Pulses: Normal pulses.     Heart sounds: Normal heart sounds, S1 normal and S2 normal. No murmur heard.    No friction rub. No gallop.  Pulmonary:     Effort: Pulmonary effort is normal. No tachypnea or respiratory distress.     Breath sounds: Normal breath sounds. No decreased breath sounds, wheezing, rhonchi or rales.  Abdominal:     General: Bowel sounds are normal.     Palpations: Abdomen is soft.     Tenderness: There is no abdominal tenderness.  Musculoskeletal:     Cervical back: Normal range of motion and neck supple.  Skin:    General: Skin is warm and dry.     Findings: No rash.  Neurological:     Mental Status: She is alert.  Psychiatric:        Mood and Affect:  Mood is not anxious or depressed.        Speech: Speech normal.        Behavior: Behavior normal. Behavior is cooperative.        Thought Content: Thought content normal.        Judgment: Judgment normal.  Results for orders placed or performed in visit on 02/13/22  Pathologist smear review  Result Value Ref Range   Path Review      Assessment and Plan  The patient's preventative maintenance and recommended screening tests for an annual wellness exam were reviewed in full today. Brought up to date unless services declined.  Counselled on the importance of diet, exercise, and its role in overall health and mortality. The patient's FH and SH was reviewed, including their home life, tobacco status, and drug and alcohol status.   Vaccines: Tdap uptodate, shingrix series done Pap/DVE:  TAH for endometriosis, no pap indicated. Mammo:  schedule in 05/2023 Bone Density: due  Colon: 2018, repeat in 10 years.  Smoking Status:  none ETOH/ drug use: none/none  Hep C:  done    Migraine without aura and with status migrainosus, not intractable Assessment & Plan: Chronic, well-controlled.  Using sumatriptan for rescue. Topamax 100 mg p.o. daily for prophylaxis.. rarely having migraine Followed by neurology,  [facility].   Mixed hyperlipidemia Assessment & Plan: Stable, chronic.  Continue current medication.   Atorvastatin 10 mg daily  Orders: -     Atorvastatin Calcium; Take 1 tablet (10 mg total) by mouth daily.  Dispense: 90 tablet; Refill: 3 -     Lipid panel -     Comprehensive metabolic panel  Primary insomnia Assessment & Plan: Chronic, well-controlled on Ambien 10 mg p.o. nightly..usually using 3 times a week, using a small amount.    Orders: -     Zolpidem Tartrate; Take 1 tablet (10 mg total) by mouth at bedtime as needed. for sleep  Dispense: 30 tablet; Refill: 0  Thrombocytopenia (HCC) Assessment & Plan:  Stable, chornic.  Due for  re-evaluation.  Orders: -     CBC with Differential/Platelet  Hyperglycemia Assessment & Plan:  Due for re-eval.  Orders: -     Hemoglobin A1c  Family history of premature CAD Assessment & Plan: Father history of MI at a relatively young age.     Calcium CT score 2014 0  Stress tesintg in 2019 low risk  Cardiology in past Dr. Herbie [place].   Osteopenia, unspecified location -     DG Bone Density; Future  Nasal congestion Assessment & Plan: No current sign of bacterial superinfection.  Possible allergic response. Can try Flonase 2 sprays per nostril at bedtime and nasal saline spray in the morning for chronic nasal congestion     Return in about 1 year (around 03/16/2024) for anuual physical , anuual physical with fasting labs prior.   Kerby Nora, MD

## 2023-03-17 NOTE — Assessment & Plan Note (Addendum)
Chronic, well-controlled on Ambien 10 mg p.o. nightly..usually using 3 times a week, using a small amount.

## 2023-04-13 ENCOUNTER — Telehealth: Payer: Self-pay | Admitting: Family Medicine

## 2023-04-13 IMAGING — MG MM DIGITAL SCREENING BILAT W/ TOMO AND CAD
8 series · 9 of 24 positions shown · non-contrast
Comparison: Previous exam(s).

CLINICAL DATA: Screening.

EXAM:
DIGITAL SCREENING BILATERAL MAMMOGRAM WITH TOMOSYNTHESIS AND CAD
TECHNIQUE: Bilateral screening digital craniocaudal and mediolateral oblique
mammograms were obtained. Bilateral screening digital breast
tomosynthesis was performed. The images were evaluated with
computer-aided detection.

[L CC synth-2D]
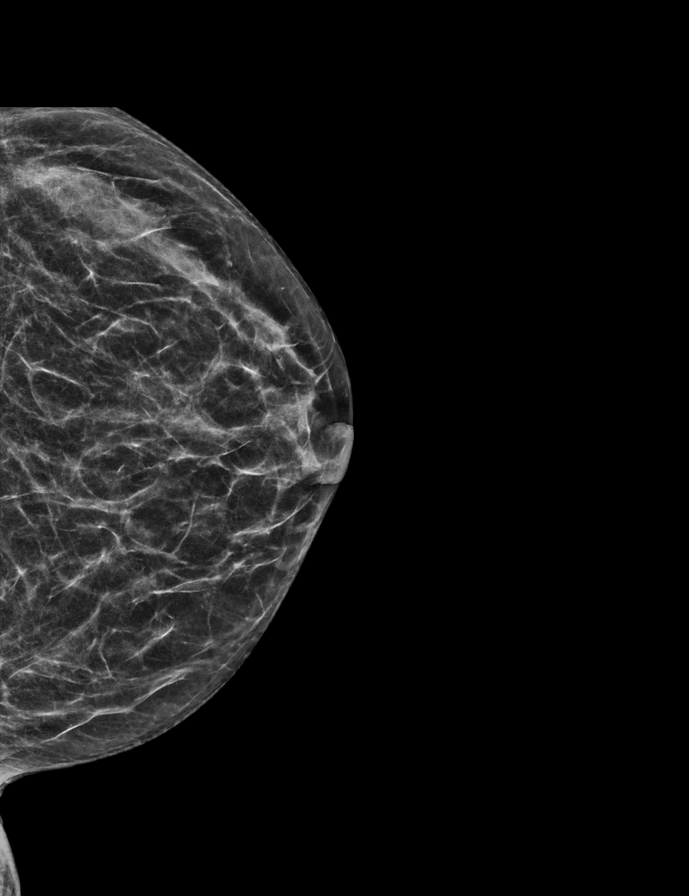

[R CC synth-2D]
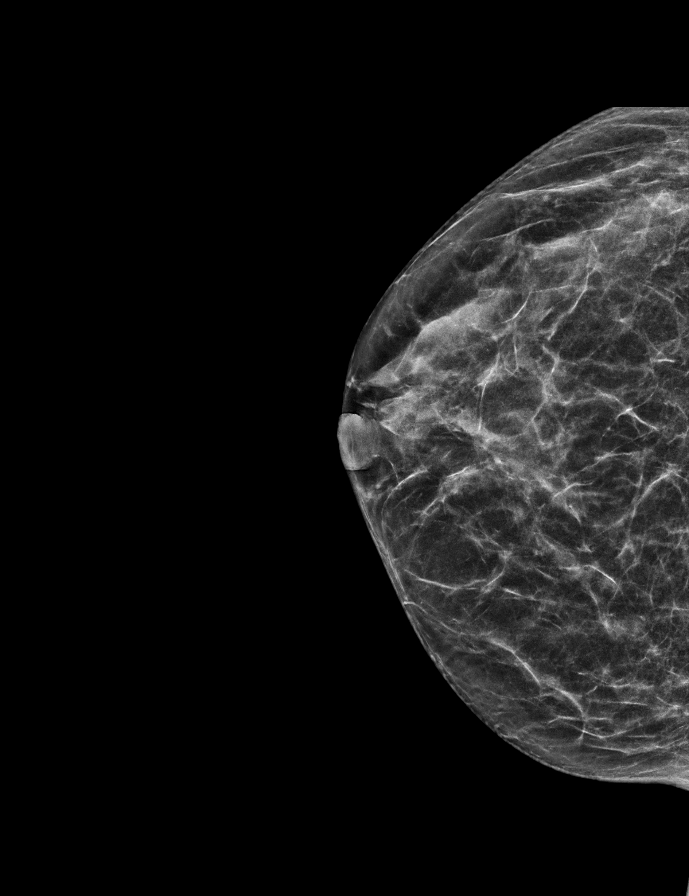

[R MLO synth-2D]
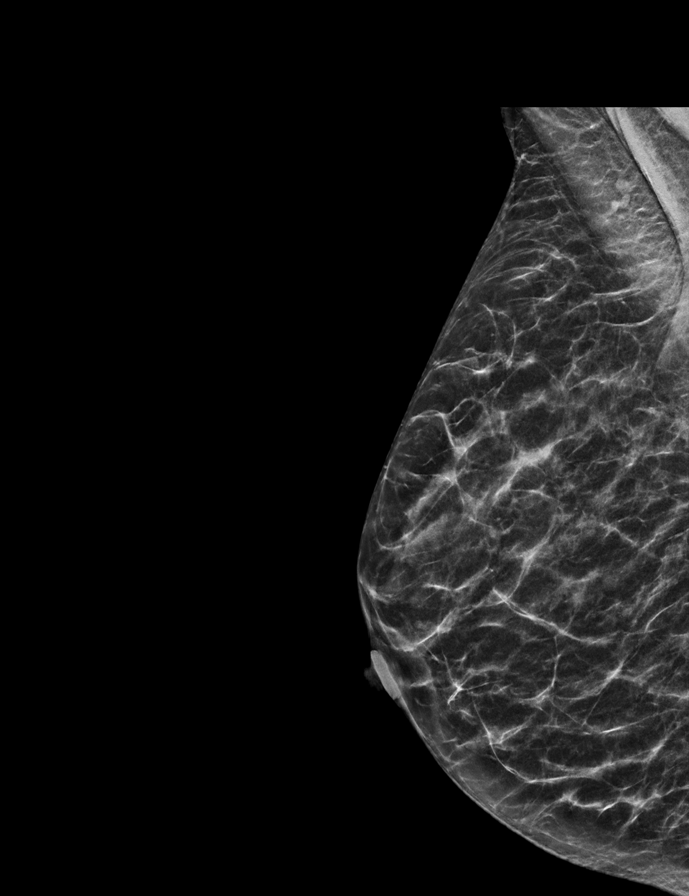

[L MLO synth-2D]
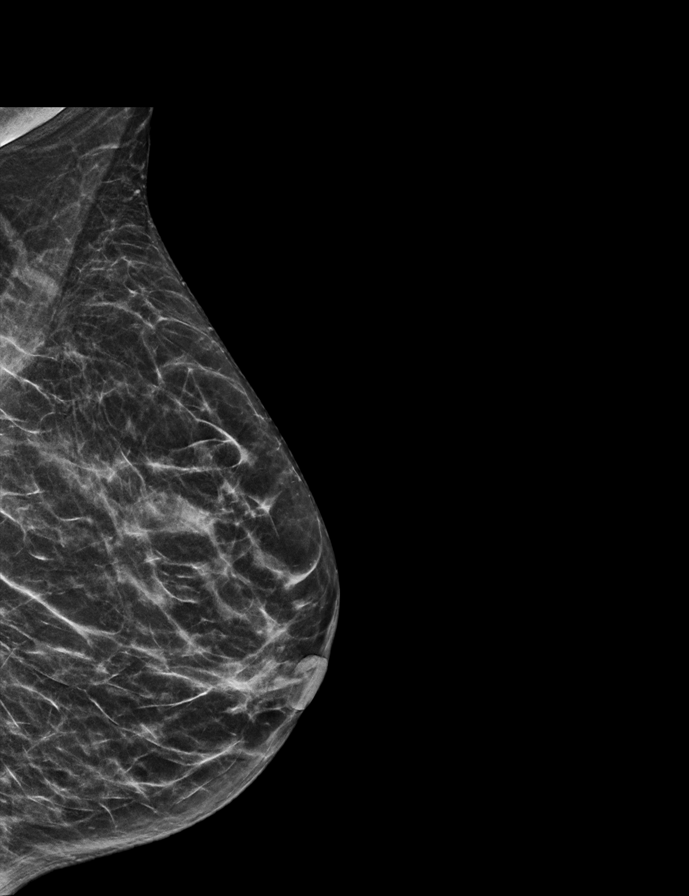

[R MLO tomo · 2 of 46 frames shown]
[frame 15/46]
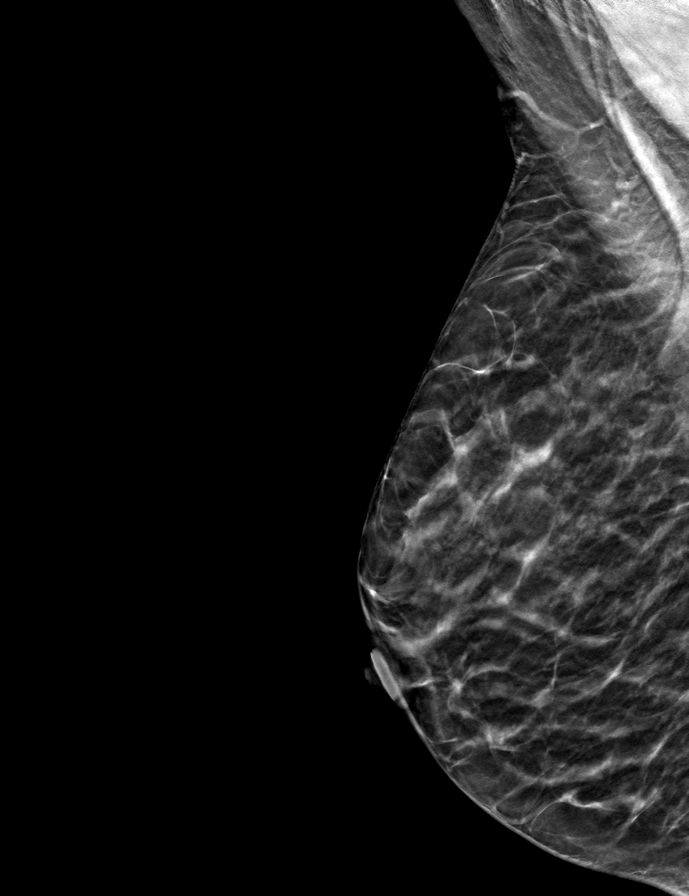
[frame 23/46]
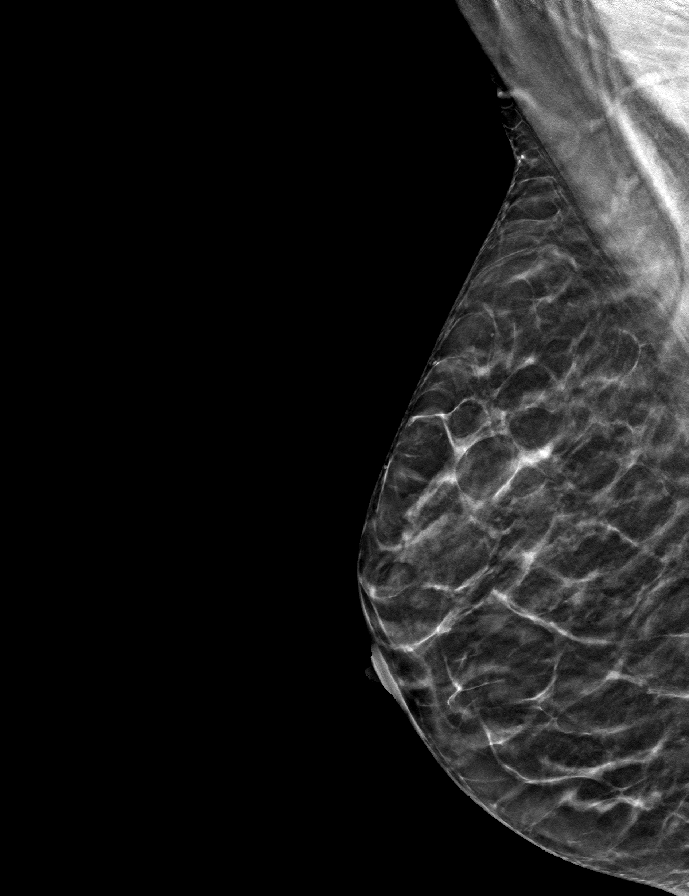

[L MLO tomo · tomo slice 25/48.0]
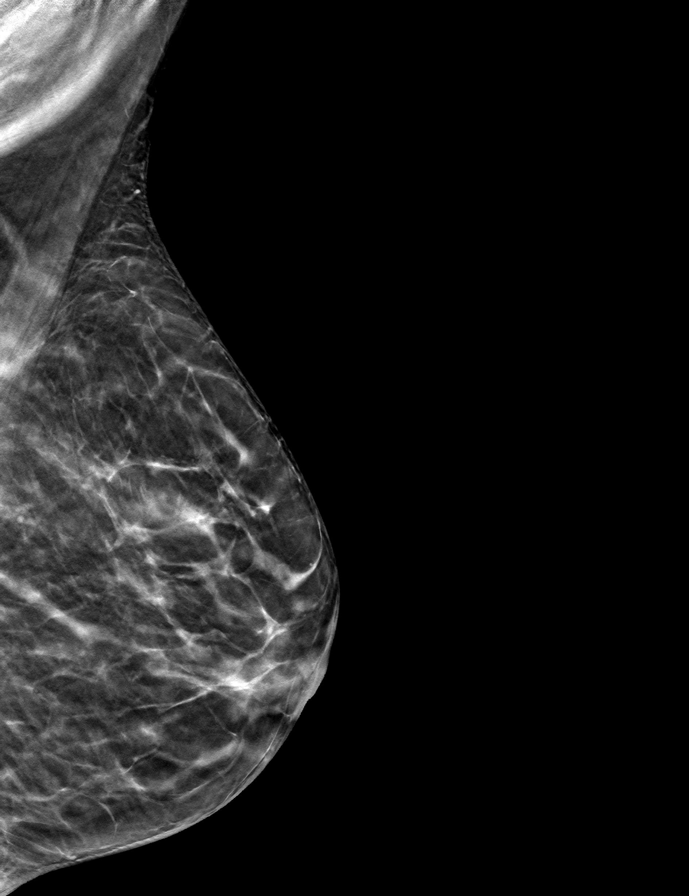

[L CC tomo · tomo slice 23/45.0]
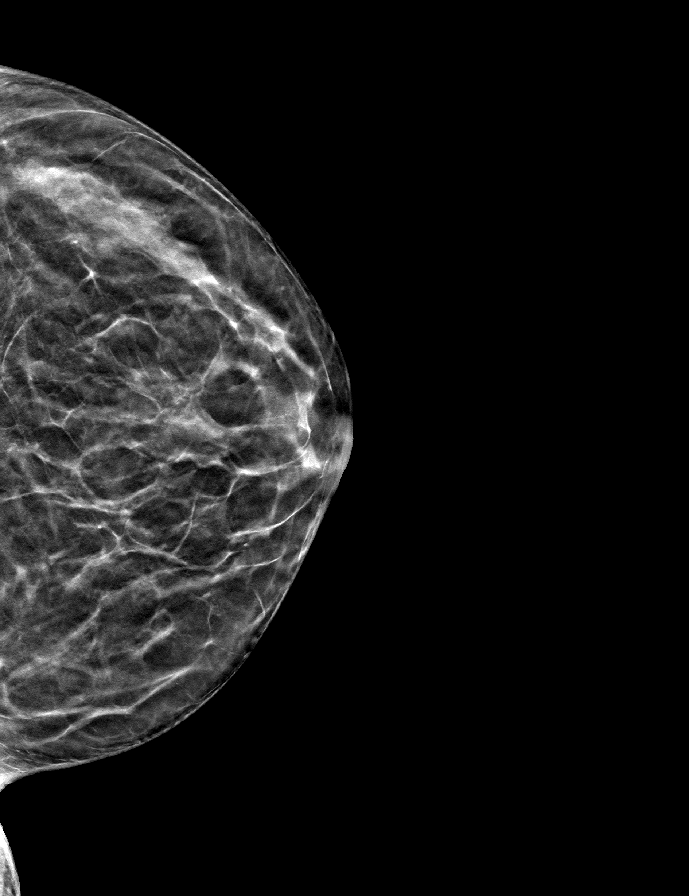

[R CC tomo · tomo slice 24/47.0]
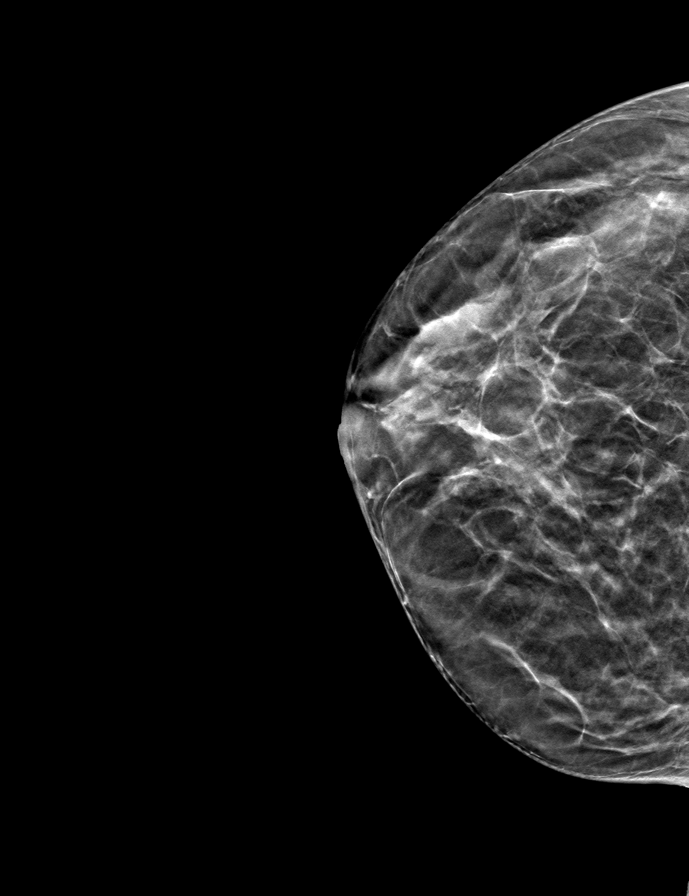

[9 of 24 positions shown; findings below may reference images not displayed]

ACR Breast Density Category c: The breast tissue is heterogeneously
dense, which may obscure small masses.
FINDINGS: There are no findings suspicious for malignancy.
IMPRESSION: No mammographic evidence of malignancy. A result letter of this
screening mammogram will be mailed directly to the patient.

RECOMMENDATION:
Screening mammogram in one year. (Code:Q3-W-BC3)

BI-RADS CATEGORY  1: Negative.

## 2023-04-13 NOTE — Telephone Encounter (Signed)
Patient contacted the office regarding visit from 5/7 which shows as a TOC. Patient says preventative healthcare was covered at this visit and she is getting a bill for it as her insurance will not cover all of it. Patient was wondering if this visit was possibly coded wrong? Informed patient that since this was labeled as a transfer of care appointment, that could be why she is getting a bill. Patient asked if someone could look into this for her.

## 2023-04-14 ENCOUNTER — Ambulatory Visit
Admission: RE | Admit: 2023-04-14 | Discharge: 2023-04-14 | Disposition: A | Discharge status: Home / Self Care | Payer: BC Managed Care – PPO | Source: Ambulatory Visit | Attending: Family Medicine | Admitting: Family Medicine

## 2023-04-14 DIAGNOSIS — N951 Menopausal and female climacteric states: Secondary | ICD-10-CM | POA: Diagnosis not present

## 2023-04-14 DIAGNOSIS — E349 Endocrine disorder, unspecified: Secondary | ICD-10-CM | POA: Diagnosis not present

## 2023-04-14 DIAGNOSIS — M8580 Other specified disorders of bone density and structure, unspecified site: Secondary | ICD-10-CM

## 2023-04-14 DIAGNOSIS — M810 Age-related osteoporosis without current pathological fracture: Secondary | ICD-10-CM | POA: Diagnosis not present

## 2023-04-29 NOTE — Telephone Encounter (Signed)
Corrected

## 2023-05-06 ENCOUNTER — Ambulatory Visit (INDEPENDENT_AMBULATORY_CARE_PROVIDER_SITE_OTHER): Payer: BC Managed Care – PPO | Admitting: Dermatology

## 2023-05-06 ENCOUNTER — Encounter: Payer: Self-pay | Admitting: Dermatology

## 2023-05-06 VITALS — BP 134/87 | HR 54

## 2023-05-06 DIAGNOSIS — L814 Other melanin hyperpigmentation: Secondary | ICD-10-CM

## 2023-05-06 DIAGNOSIS — L249 Irritant contact dermatitis, unspecified cause: Secondary | ICD-10-CM | POA: Diagnosis not present

## 2023-05-06 DIAGNOSIS — D692 Other nonthrombocytopenic purpura: Secondary | ICD-10-CM | POA: Diagnosis not present

## 2023-05-06 MED ORDER — MOMETASONE FUROATE 0.1 % EX CREA: TOPICAL_CREAM | CUTANEOUS | 1 refills | Status: AC

## 2023-05-06 NOTE — Progress Notes (Signed)
   Follow-Up Visit   Subjective  Darlene Spencer is a 62 y.o. female who presents for the following: Spots. Left forearm, between fingers. Fingers itch and burn. Lesion on arm has gotten darker.   The patient has spots, moles and lesions to be evaluated, some may be new or changing and the patient has concerns that these could be cancer.    The following portions of the chart were reviewed this encounter and updated as appropriate: medications, allergies, medical history  Review of Systems:  No other skin or systemic complaints except as noted in HPI or Assessment and Plan.  Objective  Well appearing patient in no apparent distress; mood and affect are within normal limits.  A focused examination was performed of the following areas: Arms, hands  Relevant physical exam findings are noted in the Assessment and Plan.    Assessment & Plan   Purpura - Chronic; persistent and recurrent.  Treatable, but not curable. - Violaceous macule at left wrist - Benign - Related to trauma, age, sun damage and/or use of blood thinners, chronic use of topical and/or oral steroids - Observe - Can use OTC arnica containing moisturizer such as Dermend Bruise Formula if desired - Call for worsening or other concerns  LENTIGINES Exam: scattered tan macules arms Due to sun exposure Treatment Plan: Benign-appearing, observe. Recommend daily broad spectrum sunscreen SPF 30+ to sun-exposed areas, reapply every 2 hours as needed.  Call for any changes   IRRITANT HAND DERMATITIS Exam: no active areas today, mild xerosis  Treatment Plan:  Start Mometasone cream twice daily to affected areas on hands for rash/itching as needed only.   Topical steroids (such as triamcinolone, fluocinolone, fluocinonide, mometasone, clobetasol, halobetasol, betamethasone, hydrocortisone) can cause thinning and lightening of the skin if they are used for too long in the same area. Your physician has selected the right  strength medicine for your problem and area affected on the body. Please use your medication only as directed by your physician to prevent side effects.    Recommend using Dove soap for sensitive skin  Dry well between fingers after washing hands.  Moisturize hands daily with moisturizing cream. Samples given.  Return if symptoms worsen or fail to improve.  I, Lawson Radar, CMA, am acting as scribe for Willeen Niece, MD.   Documentation: I have reviewed the above documentation for accuracy and completeness, and I agree with the above.  Willeen Niece, MD

## 2023-05-06 NOTE — Patient Instructions (Addendum)
Start Mometasone cream twice daily to affected areas on hands for rash/itching as needed only.   Topical steroids (such as triamcinolone, fluocinolone, fluocinonide, mometasone, clobetasol, halobetasol, betamethasone, hydrocortisone) can cause thinning and lightening of the skin if they are used for too long in the same area. Your physician has selected the right strength medicine for your problem and area affected on the body. Please use your medication only as directed by your physician to prevent side effects.    Recommend using Dove soap for sensitive skin  Dry well between fingers after washing hands.  Moisturize hands daily with moisturizing cream.    For Bruising on arms: Can use OTC arnica containing moisturizer such as Dermend Bruise Formula if desired     Gentle Skin Care Guide  1. Bathe no more than once a day.  2. Avoid bathing in hot water  3. Use a mild soap like Dove, Vanicream, Cetaphil, CeraVe. Can use Lever 2000 or Cetaphil antibacterial soap  4. Use soap only where you need it. On most days, use it under your arms, between your legs, and on your feet. Let the water rinse other areas unless visibly dirty.  5. When you get out of the bath/shower, use a towel to gently blot your skin dry, don't rub it.  6. While your skin is still a little damp, apply a moisturizing cream such as Vanicream, CeraVe, Cetaphil, Eucerin, Sarna lotion or plain Vaseline Jelly. For hands apply Neutrogena [place] Hand Cream or Excipial Hand Cream.  7. Reapply moisturizer any time you start to itch or feel dry.  8. Sometimes using free and clear laundry detergents can be helpful. Fabric softener sheets should be avoided. Downy Free & Gentle liquid, or any liquid fabric softener that is free of dyes and perfumes, it acceptable to use  9. If your doctor has given you prescription creams you may apply moisturizers over them     Due to recent changes in healthcare laws, you may see results  of your pathology and/or laboratory studies on MyChart before the doctors have had a chance to review them. We understand that in some cases there may be results that are confusing or concerning to you. Please understand that not all results are received at the same time and often the doctors may need to interpret multiple results in order to provide you with the best plan of care or course of treatment. Therefore, we ask that you please give [place] 2 business days to thoroughly review all your results before contacting the office for clarification. Should we see a critical lab result, you will be contacted sooner.   If You Need Anything After Your Visit  If you have any questions or concerns for your doctor, please call our main line at [phone] and press option 4 to reach your doctor's medical assistant. If no one answers, please leave a voicemail as directed and we will return your call as soon as possible. Messages left after 4 pm will be answered the following business day.   You may also send [place] a message via MyChart. We typically respond to MyChart messages within 1-2 business days.  For prescription refills, please ask your pharmacy to contact our office. Our fax number is [phone].  If you have an urgent issue when the clinic is closed that cannot wait until the next business day, you can page your doctor at the number below.    Please note that while we do our best to be available for  urgent issues outside of office hours, we are not available 24/7.   If you have an urgent issue and are unable to reach [place], you may choose to seek medical care at your doctor's office, retail clinic, urgent care center, or emergency room.  If you have a medical emergency, please immediately call 911 or go to the emergency department.  Pager Numbers  - Dr. Gwen Pounds: [phone]  - Dr. Neale Burly: [phone]  - Dr. Roseanne Reno: [phone]  In the event of inclement weather, please call our main line at  [phone] for an update on the status of any delays or closures.  Dermatology Medication Tips: Please keep the boxes that topical medications come in in order to help keep track of the instructions about where and how to use these. Pharmacies typically print the medication instructions only on the boxes and not directly on the medication tubes.   If your medication is too expensive, please contact our office at [phone] option 4 or send [place] a message through MyChart.   We are unable to tell what your co-pay for medications will be in advance as this is different depending on your insurance coverage. However, we may be able to find a substitute medication at lower cost or fill out paperwork to get insurance to cover a needed medication.   If a prior authorization is required to get your medication covered by your insurance company, please allow [place] 1-2 business days to complete this process.  Drug prices often vary depending on where the prescription is filled and some pharmacies may offer cheaper prices.  The website www.goodrx.com contains coupons for medications through different pharmacies. The prices here do not account for what the cost may be with help from insurance (it may be cheaper with your insurance), but the website can give you the price if you did not use any insurance.  - You can print the associated coupon and take it with your prescription to the pharmacy.  - You may also stop by our office during regular business hours and pick up a GoodRx coupon card.  - If you need your prescription sent electronically to a different pharmacy, notify our office through [facility] or by phone at [phone] option 4.     Si Usted Necesita Algo Despus de Su Visita  Tambin puede enviarnos un mensaje a travs de Clinical cytogeneticist. Por lo general respondemos a los mensajes de MyChart en el transcurso de 1 a 2 das hbiles.  Para renovar recetas, por favor pida a su farmacia que se  ponga en contacto con nuestra oficina. Annie Sable de fax es [address] [phone].  Si tiene un asunto urgente cuando la clnica est cerrada y que no puede esperar hasta el siguiente da hbil, puede llamar/localizar a su doctor(a) al nmero que aparece a continuacin.   Por favor, tenga en cuenta que aunque hacemos todo lo posible para estar disponibles para asuntos urgentes fuera del horario de [place], no estamos disponibles las 24 horas del da, los 7 [address]  [address] de la [place].   Si tiene un problema urgente y no puede comunicarse con nosotros, puede optar por buscar atencin mdica  en el consultorio de su doctor(a), en una clnica privada, en un centro de atencin urgente o en una sala de emergencias.  Si tiene Engineer, drilling, por favor llame inmediatamente al 911 o vaya a la sala de emergencias.  Nmeros de bper  - Dr. Gwen Pounds: [phone]  - Dra. Moye: [phone]  - Dra. Roseanne Reno: [phone]  En caso de inclemencias del [place], por favor llame a nuestra lnea principal al [phone] para una actualizacin sobre el [place] de cualquier retraso o cierre.  Consejos para la medicacin en dermatologa: Por favor, guarde las cajas en las que vienen los medicamentos de uso tpico para ayudarle a seguir las instrucciones sobre dnde y cmo usarlos. Las farmacias generalmente imprimen las instrucciones del medicamento slo en las cajas y no directamente en los tubos del [place].   Si su medicamento es muy caro, por favor, pngase en contacto con Rolm Gala llamando al [phone] y presione la opcin 4 o envenos un mensaje a travs de Clinical cytogeneticist.   No podemos decirle cul ser su copago por los medicamentos por adelantado ya que esto es diferente dependiendo de la cobertura de su seguro. Sin embargo, es posible que podamos encontrar un medicamento sustituto a Audiological scientist un formulario para que el seguro cubra el medicamento que se considera necesario.   Si se requiere  una autorizacin previa para que su compaa de seguros [place] su medicamento, por favor permtanos de 1 a 2 das hbiles para completar [address]  Los precios de los medicamentos varan con frecuencia dependiendo del Environmental consultant de dnde se surte la receta y alguna farmacias pueden ofrecer precios ms baratos.  El sitio web www.goodrx.com tiene cupones para medicamentos de Health and safety inspector. Los precios aqu no tienen en cuenta lo que podra costar con la ayuda del seguro (puede ser ms barato con su seguro), pero el sitio web puede darle el precio si no utiliz Tourist information centre manager.  - Puede imprimir el cupn correspondiente y llevarlo con su receta a la farmacia.  - Tambin puede pasar por nuestra oficina durante el horario de atencin regular y Education officer, museum una tarjeta de cupones de GoodRx.  - Si necesita que su receta se enve electrnicamente a una farmacia diferente, informe a nuestra oficina a travs de MyChart de [place] o por telfono llamando al [phone] y presione la opcin 4.

## 2023-05-12 ENCOUNTER — Other Ambulatory Visit: Payer: Self-pay | Admitting: Family Medicine

## 2023-05-12 DIAGNOSIS — F5101 Primary insomnia: Secondary | ICD-10-CM

## 2023-05-12 NOTE — Telephone Encounter (Addendum)
Last office visit 03/17/23 for Establish Care.  Last refilled 03/17/2023 for #30 with no refills.  Next Appt: No future appointments with PCP.

## 2023-05-25 ENCOUNTER — Ambulatory Visit (INDEPENDENT_AMBULATORY_CARE_PROVIDER_SITE_OTHER): Payer: BC Managed Care – PPO | Admitting: Primary Care

## 2023-05-25 ENCOUNTER — Encounter: Payer: Self-pay | Admitting: Primary Care

## 2023-05-25 VITALS — BP 112/80 | HR 70 | Temp 98.4°F | Ht 63.5 in | Wt 122.6 lb

## 2023-05-25 DIAGNOSIS — Z23 Encounter for immunization: Secondary | ICD-10-CM | POA: Diagnosis not present

## 2023-05-25 DIAGNOSIS — L03011 Cellulitis of right finger: Secondary | ICD-10-CM | POA: Insufficient documentation

## 2023-05-25 MED ORDER — SULFAMETHOXAZOLE-TRIMETHOPRIM 800-160 MG PO TABS: 1.0000 | ORAL_TABLET | Freq: Two times a day (BID) | ORAL | 0 refills | Status: DC

## 2023-05-25 NOTE — Assessment & Plan Note (Signed)
Complicated by a fall last week. See included picture.  Treat with Bactrim DS tablets twice daily x 7 days. Stop peroxide soaks.  Updated tetanus vaccine today. Return precautions provided.

## 2023-05-25 NOTE — Patient Instructions (Signed)
Start Bactrim DS (sulfamethoxazole/trimethoprim) tablets.  Take 1 tablet by mouth twice daily for 7 days.  Please follow-up with no improvement.  It was a pleasure to see you today!

## 2023-05-25 NOTE — Addendum Note (Signed)
Addended by: Mary Sella D on: 05/25/2023 03:58 PM   Modules accepted: Orders

## 2023-05-25 NOTE — Progress Notes (Signed)
Subjective:    Patient ID: Darlene Spencer, female    DOB: 08/29/1961, 62 y.o.   MRN: [id number]  Hand Pain     Darlene Spencer is a very pleasant 62 y.o. female patient of Dr. Ermalene Searing with a history of thrombocytopenia, hyperlipidemia, leukopenia, hyperglycemia who presents today to discuss hand pain.  Four days ago she tripped and feel [place] She sustained abrasions to her right 3rd, 4th, and 5th digits, and right knee.   Since then the abrasions have not healed. She's also noticed redness and increased redness around the injury sites.  She's applied neosporin and peroxide since without much improvement.  She woke up yesterday with vertigo and nausea. This morning she noticed slight vertigo and nausea. Overall, she doesn't feel quite right.  Her last tetanus vaccine was in 2016.   Review of Systems  Constitutional:  Negative for fever.  Skin:  Positive for color change and wound.  Neurological:  Negative for weakness.         Past Medical History:  Diagnosis Date   Endometriosis 2007   endometriotc cyst Rt.ovary   Headache(784.0)    Hyperlipidemia    on meds   Osteopenia 08/2017   T score -1.6 FRAX 6.6% / 0.6%    Social History   Socioeconomic History   Marital status: Married    Spouse name: Vanda Waskey   Number of children: 2   Years of education: Not on file   Highest education level: Associate degree: occupational, Scientist, product/process development, or vocational program  Occupational History    Employer: [employer]  Tobacco Use   Smoking status: Never   Smokeless tobacco: Never  Vaping Use   Vaping status: Never Used  Substance and Sexual Activity   Alcohol use: Never   Drug use: No   Sexual activity: Yes    Birth control/protection: Surgical    Comment: intercourse age 31, less than 5 sexual parters des neg  Other Topics Concern   Not on file  Social History Narrative   She works for [employer], 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  Has a community college education.  Lives with her husband Aurther Loft.     They have 2 children and 3 grandchildren (14 and 40) - currently watching her grandchildren with homeschool     Exercise: walking,   Enjoys: spending time with grandchildren, yard work   Diet: moderate, good intake   Social Determinants of Corporate investment banker Strain: Low Risk  (03/31/2019)   Overall Financial Resource Strain (CARDIA)    Difficulty of Paying Living Expenses: Not hard at all  Food Insecurity: Not on file  Transportation Needs: Not on file  Physical Activity: Not on file  Stress: Not on file  Social Connections: Not on file  Intimate Partner Violence: Not on file    Past Surgical History:  Procedure Laterality Date   COLONOSCOPY     OOPHORECTOMY  2007   BSO   PELVIC LAPAROSCOPY  2007   BSO   VAGINAL HYSTERECTOMY  1998    Family History  Problem Relation Age of Onset   Miscarriages / Stillbirths Mother    Hyperlipidemia Mother    Hypertension Mother    Heart disease Father    Heart failure Father    Heart attack Father 50   Hyperlipidemia Sister    Hyperlipidemia Sister    Hyperlipidemia Sister    Hyperlipidemia Sister    Hyperlipidemia Sister  Hyperlipidemia Brother    Hyperlipidemia Brother    Hypertension Maternal Grandfather    Colon cancer Maternal Grandfather     No Known Allergies  Current Outpatient Medications on File Prior to Visit  Medication Sig Dispense Refill   atorvastatin (LIPITOR) 10 MG tablet Take 1 tablet (10 mg total) by mouth daily. 90 tablet 3   Calcium Carbonate-Vitamin D 600-400 MG-UNIT tablet Take 1 tablet by mouth daily at 6 PM.     FIBER PO Take by mouth. Fiber Well Sugar -Free Gummies-Take 2 daily     fish oil-omega-3 fatty acids 1000 MG capsule Take 1 g by mouth daily.     mometasone (ELOCON) 0.1 % cream Apply  twice daily to affected areas on hands for rash/itching as needed only. 45 g 1   Multiple Vitamin (MULTIVITAMIN)  tablet Take 1 tablet by mouth. Women's MVI-Take 2 chewables daily     topiramate (TOPAMAX) 100 MG tablet Take 100 mg by mouth daily at 6 PM.     valACYclovir (VALTREX) 1000 MG tablet Take 2 pills at first sign of symptoms, repeat in 12 hours for 1 day dose. Repeat PRN flares. 30 tablet 11   zolpidem (AMBIEN) 10 MG tablet TAKE 1 TABLET (10 MG TOTAL) BY MOUTH AT BEDTIME AS NEEDED. FOR SLEEP 30 tablet 0   Current Facility-Administered Medications on File Prior to Visit  Medication Dose Route Frequency Provider Last Rate Last Admin   0.9 %  sodium chloride infusion  500 mL Intravenous Continuous Iva Boop, MD        BP 112/80   Pulse 70   Temp 98.4 F (36.9 C) (Temporal)   Ht 5' 3.5" (1.613 m)   Wt 122 lb 9.6 oz (55.6 kg)   LMP 11/10/1996   SpO2 98%   BMI 21.38 kg/m  Objective:   Physical Exam Constitutional:      General: She is not in acute distress. Pulmonary:     Effort: Pulmonary effort is normal.  Skin:    General: Skin is warm and dry.     Findings: Erythema present.     Comments: Open, scabbed wounds to right dorsal third fourth and fifth digits.  Surrounding erythema with warmth and swelling to right third and fourth digits.  Abrasion distal to right knee             Assessment & Plan:  Cellulitis of finger of right hand Assessment & Plan: Complicated by a fall last week. See included picture.  Treat with Bactrim DS tablets twice daily x 7 days. Stop peroxide soaks.  Updated tetanus vaccine today. Return precautions provided.  Orders: -     Sulfamethoxazole-Trimethoprim; Take 1 tablet by mouth 2 (two) times daily.  Dispense: 14 tablet; Refill: 0        Doreene Nest, NP

## 2023-05-29 ENCOUNTER — Ambulatory Visit
Admission: RE | Admit: 2023-05-29 | Discharge: 2023-05-29 | Disposition: A | Discharge status: Home / Self Care | Payer: BC Managed Care – PPO | Source: Ambulatory Visit | Attending: Family Medicine | Admitting: Family Medicine

## 2023-05-29 DIAGNOSIS — Z1231 Encounter for screening mammogram for malignant neoplasm of breast: Secondary | ICD-10-CM | POA: Diagnosis not present

## 2023-06-16 ENCOUNTER — Ambulatory Visit (INDEPENDENT_AMBULATORY_CARE_PROVIDER_SITE_OTHER): Payer: BC Managed Care – PPO | Admitting: Primary Care

## 2023-06-16 ENCOUNTER — Encounter: Payer: Self-pay | Admitting: Primary Care

## 2023-06-16 VITALS — BP 118/62 | HR 70 | Temp 98.6°F | Ht 63.5 in | Wt 122.0 lb

## 2023-06-16 DIAGNOSIS — Z20822 Contact with and (suspected) exposure to covid-19: Secondary | ICD-10-CM | POA: Diagnosis not present

## 2023-06-16 DIAGNOSIS — U071 COVID-19: Secondary | ICD-10-CM | POA: Insufficient documentation

## 2023-06-16 LAB — POC COVID19 BINAXNOW: SARS Coronavirus 2 Ag: POSITIVE — AB

## 2023-06-16 MED ORDER — NIRMATRELVIR/RITONAVIR (PAXLOVID)TABLET: 3.0000 | ORAL_TABLET | Freq: Two times a day (BID) | ORAL | 0 refills | Status: AC

## 2023-06-16 NOTE — Assessment & Plan Note (Signed)
Positive COVID-19 test in the office today. Stable for outpatient treatment. Qualifies for antiviral treatment given medical history.  She would like to proceed.  Reviewed GFR from May 2024 which is within normal range.  Prescription for Paxlovid course sent to pharmacy. Discussed instructions for administration.  We also discussed quarantine recommendations and post quarantine recommendations. Return precautions provided.

## 2023-06-16 NOTE — Progress Notes (Signed)
Subjective:    Patient ID: Darlene Spencer, female    DOB: 07-08-1961, 62 y.o.   MRN: [id number]  HPI  Darlene Spencer is a very pleasant 62 y.o. female patient of Dr. Ermalene Searing with a history of migraines, leukopenia, cellulitis, thrombocytopenia who presents today to discuss cough.  Symptom onset yesterday with nasal congestion, rhinorrhea, and felt feverish. She continues with those symptoms and is now feeling fatigued and with a mild cough.   She was at a conference out of town last week and at a 50th wedding anniversary two days ago. She's not sure where she was exposed but was around a lot of people. She's taken two home Covid-19 tests, both of which were positive.  She's not taken anything OTC for her symptoms.  She is interested in antiviral treatment.  She completed a course of Bactrim DS tablets approximately 2 weeks ago for cellulitis.   Review of Systems  Constitutional:  Positive for fatigue. Negative for fever.  HENT:  Positive for congestion and rhinorrhea.   Respiratory:  Positive for cough. Negative for shortness of breath.   Cardiovascular:  Negative for chest pain.  Neurological:  Positive for headaches.         Past Medical History:  Diagnosis Date   Endometriosis 2007   endometriotc cyst Rt.ovary   Headache(784.0)    Hyperlipidemia    on meds   Osteopenia 08/2017   T score -1.6 FRAX 6.6% / 0.6%    Social History   Socioeconomic History   Marital status: Married    Spouse name: Rochell Ewbank   Number of children: 2   Years of education: Not on file   Highest education level: Associate degree: occupational, Scientist, product/process development, or vocational program  Occupational History    Employer: [employer]  Tobacco Use   Smoking status: Never   Smokeless tobacco: Never  Vaping Use   Vaping status: Never Used  Substance and Sexual Activity   Alcohol use: Never   Drug use: No   Sexual activity: Yes    Birth control/protection: Surgical     Comment: intercourse age 22, less than 5 sexual parters des neg  Other Topics Concern   Not on file  Social History Narrative   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  Has a community college education.  Lives with her husband Aurther Loft.     They have 2 children and 3 grandchildren (14 and 11) - currently watching her grandchildren with homeschool     Exercise: walking,   Enjoys: spending time with grandchildren, yard work   Diet: moderate, good intake   Social Determinants of Corporate investment banker Strain: Low Risk  (03/31/2019)   Overall Financial Resource Strain (CARDIA)    Difficulty of Paying Living Expenses: Not hard at all  Food Insecurity: Not on file  Transportation Needs: Not on file  Physical Activity: Not on file  Stress: Not on file  Social Connections: Not on file  Intimate Partner Violence: Not on file    Past Surgical History:  Procedure Laterality Date   COLONOSCOPY     OOPHORECTOMY  2007   BSO   PELVIC LAPAROSCOPY  2007   BSO   VAGINAL HYSTERECTOMY  1998    Family History  Problem Relation Age of Onset   Miscarriages / Stillbirths Mother    Hyperlipidemia Mother    Hypertension Mother    Heart disease Father    Heart failure Father  Heart attack Father 63   Hyperlipidemia Sister    Hyperlipidemia Sister    Hyperlipidemia Sister    Hyperlipidemia Sister    Hyperlipidemia Sister    Hyperlipidemia Brother    Hyperlipidemia Brother    Hypertension Maternal Grandfather    Colon cancer Maternal Grandfather     No Known Allergies  Current Outpatient Medications on File Prior to Visit  Medication Sig Dispense Refill   atorvastatin (LIPITOR) 10 MG tablet Take 1 tablet (10 mg total) by mouth daily. 90 tablet 3   Calcium Carbonate-Vitamin D 600-400 MG-UNIT tablet Take 1 tablet by mouth daily at 6 PM.     FIBER PO Take by mouth. Fiber Well Sugar -Free Gummies-Take 2 daily     fish oil-omega-3 fatty acids 1000 MG capsule Take 1  g by mouth daily.     Multiple Vitamin (MULTIVITAMIN) tablet Take 1 tablet by mouth. Women's MVI-Take 2 chewables daily     topiramate (TOPAMAX) 100 MG tablet Take 100 mg by mouth daily at 6 PM.     valACYclovir (VALTREX) 1000 MG tablet Take 2 pills at first sign of symptoms, repeat in 12 hours for 1 day dose. Repeat PRN flares. 30 tablet 11   zolpidem (AMBIEN) 10 MG tablet TAKE 1 TABLET (10 MG TOTAL) BY MOUTH AT BEDTIME AS NEEDED. FOR SLEEP 30 tablet 0   mometasone (ELOCON) 0.1 % cream Apply  twice daily to affected areas on hands for rash/itching as needed only. (Patient not taking: Reported on 06/16/2023) 45 g 1   Current Facility-Administered Medications on File Prior to Visit  Medication Dose Route Frequency Provider Last Rate Last Admin   0.9 %  sodium chloride infusion  500 mL Intravenous Continuous Iva Boop, MD        BP 118/62   Pulse 70   Temp 98.6 F (37 C) (Temporal)   Ht 5' 3.5" (1.613 m)   Wt 122 lb (55.3 kg)   LMP 11/10/1996   SpO2 98%   BMI 21.27 kg/m  Objective:   Physical Exam Constitutional:      General: She is not in acute distress.    Appearance: She is ill-appearing.  HENT:     Right Ear: Tympanic membrane and ear canal normal.     Left Ear: Tympanic membrane and ear canal normal.     Nose:     Right Sinus: No maxillary sinus tenderness or frontal sinus tenderness.     Left Sinus: No maxillary sinus tenderness or frontal sinus tenderness.  Eyes:     Conjunctiva/sclera: Conjunctivae normal.  Cardiovascular:     Rate and Rhythm: Normal rate and regular rhythm.  Pulmonary:     Effort: Pulmonary effort is normal.     Breath sounds: Normal breath sounds. No wheezing or rales.  Musculoskeletal:     Cervical back: Neck supple.  Lymphadenopathy:     Cervical: No cervical adenopathy.  Skin:    General: Skin is warm and dry.           Assessment & Plan:  COVID-19 virus infection Assessment & Plan: Positive COVID-19 test in the office  today. Stable for outpatient treatment. Qualifies for antiviral treatment given medical history.  She would like to proceed.  Reviewed GFR from May 2024 which is within normal range.  Prescription for Paxlovid course sent to pharmacy. Discussed instructions for administration.  We also discussed quarantine recommendations and post quarantine recommendations. Return precautions provided.  Orders: -     nirmatrelvir/ritonavir;  Take 3 tablets by mouth 2 (two) times daily for 5 days.  Dispense: 30 tablet; Refill: 0  Exposure to COVID-19 virus -     POC COVID-19 BinaxNow        Doreene Nest, NP

## 2023-06-16 NOTE — Patient Instructions (Signed)
Start Paxlovid medication for COVID-19 infection.  Take 3 capsules by mouth twice daily for 5 days.  You should remain at home until your symptoms have improved significantly and you have been fever free for at least 24 hours.  Wear a mask when around others.  It was a pleasure to see you today!

## 2023-06-25 ENCOUNTER — Other Ambulatory Visit: Payer: Self-pay

## 2023-06-25 ENCOUNTER — Emergency Department (HOSPITAL_BASED_OUTPATIENT_CLINIC_OR_DEPARTMENT_OTHER)
Admission: EM | Admit: 2023-06-25 | Discharge: 2023-06-25 | Disposition: A | Discharge status: Home / Self Care | Payer: BC Managed Care – PPO | Attending: Emergency Medicine | Admitting: Emergency Medicine

## 2023-06-25 ENCOUNTER — Telehealth: Payer: Self-pay | Admitting: Family Medicine

## 2023-06-25 ENCOUNTER — Emergency Department (HOSPITAL_BASED_OUTPATIENT_CLINIC_OR_DEPARTMENT_OTHER): Payer: BC Managed Care – PPO

## 2023-06-25 ENCOUNTER — Encounter (HOSPITAL_BASED_OUTPATIENT_CLINIC_OR_DEPARTMENT_OTHER): Payer: Self-pay

## 2023-06-25 DIAGNOSIS — R079 Chest pain, unspecified: Secondary | ICD-10-CM | POA: Diagnosis not present

## 2023-06-25 DIAGNOSIS — R059 Cough, unspecified: Secondary | ICD-10-CM | POA: Diagnosis not present

## 2023-06-25 DIAGNOSIS — U071 COVID-19: Secondary | ICD-10-CM | POA: Diagnosis not present

## 2023-06-25 DIAGNOSIS — R072 Precordial pain: Secondary | ICD-10-CM | POA: Insufficient documentation

## 2023-06-25 DIAGNOSIS — R0789 Other chest pain: Secondary | ICD-10-CM | POA: Diagnosis not present

## 2023-06-25 DIAGNOSIS — J9811 Atelectasis: Secondary | ICD-10-CM | POA: Diagnosis not present

## 2023-06-25 LAB — BASIC METABOLIC PANEL
Anion gap: 9 (ref 5–15)
BUN: 20 mg/dL (ref 8–23)
CO2: 25 mmol/L (ref 22–32)
Calcium: 9.5 mg/dL (ref 8.9–10.3)
Chloride: 108 mmol/L (ref 98–111)
Creatinine, Ser: 0.9 mg/dL (ref 0.44–1.00)
GFR, Estimated: >60 mL/min (ref >60)
Glucose, Bld: 95 mg/dL (ref 70–99)
Potassium: 3.5 mmol/L (ref 3.5–5.1)
Sodium: 142 mmol/L (ref 135–145)

## 2023-06-25 LAB — CBC
HCT: 40.7 % (ref 36.0–46.0)
Hemoglobin: 13.5 g/dL (ref 12.0–15.0)
MCH: 31.6 pg (ref 26.0–34.0)
MCHC: 33.2 g/dL (ref 30.0–36.0)
MCV: 95.3 fL (ref 80.0–100.0)
Platelets: 144 10*3/uL — ABNORMAL LOW (ref 150–400)
RBC: 4.27 MIL/uL (ref 3.87–5.11)
RDW: 12.3 % (ref 11.5–15.5)
WBC: 4.3 10*3/uL (ref 4.0–10.5)
nRBC: 0 % (ref 0.0–0.2)

## 2023-06-25 LAB — TROPONIN I (HIGH SENSITIVITY): Troponin I (High Sensitivity): 3 ng/L (ref <18)

## 2023-06-25 LAB — D-DIMER, QUANTITATIVE: D-Dimer, Quant: <0.27 ug{FEU}/mL (ref 0.00–0.50)

## 2023-06-25 MED ORDER — ACETAMINOPHEN 500 MG PO TABS
1000.0000 mg | ORAL_TABLET | Freq: Once | ORAL | Status: AC
  Administered 2023-06-25: 1000 mg via ORAL
  Filled 2023-06-25: qty 2

## 2023-06-25 NOTE — Telephone Encounter (Signed)
Agree with ER eval.

## 2023-06-25 NOTE — Telephone Encounter (Signed)
FYI: This call has been transferred to Access Nurse. Once the result note has been entered staff can address the message at that time.  Patient called in with the following symptoms:  Red Word:chest pain   Please advise at Mobile [phone] (mobile)  Message is routed to Provider Pool and [facility] Acute Care Triage    Pt stated she tested pos for covid when she saw [facility] on 8/6. Pt states the last few days she's been experiencing chest tightness/pain on left side on her chest. Transferred to access nurse.

## 2023-06-25 NOTE — Discharge Instructions (Addendum)
It was our pleasure to provide your ER care today - we hope that you feel better.  Drink plenty of fluids/stay well hydrated.  You may try nyquil, mucinex, robitussin or similar over the counter cold and flu medication for symptom relief. You may take ibuprofen or aleve as need.  Follow up with primary care doctor in the next 1-2 weeks.   Return to ER if worse, new symptoms, increased trouble breathing, recurrent/persistent chest pain, or other emergency concern.

## 2023-06-25 NOTE — ED Provider Notes (Signed)
[place] EMERGENCY DEPARTMENT AT [facility] Provider Note   CSN: [id number] Arrival date & time: 06/25/23  0102     History  Chief Complaint  Patient presents with   Chest Pain    Darlene Spencer is a 62 y.o. female.  Pt with c/o mid to left chest pain at rest in past few days. Worse w certain movements, cough, and occasionally w deep breath. No associated nv, diaphoresis or sob. Was dx with covid 8/6. Notes symptoms persistent. Non prod cough, congestion, aches. Denies leg pain or swelling. No hx dvt or pe. No hx cad. No heartburn.   The history is provided by the patient and medical records.  Chest Pain Associated symptoms: cough   Associated symptoms: no abdominal pain, no back pain, no fever, no headache, no nausea, no shortness of breath and no vomiting        Home Medications Prior to Admission medications   Medication Sig Start Date End Date Taking? Authorizing Provider  atorvastatin (LIPITOR) 10 MG tablet Take 1 tablet (10 mg total) by mouth daily. 03/17/23   Excell Seltzer, MD  Calcium Carbonate-Vitamin D 600-400 MG-UNIT tablet Take 1 tablet by mouth daily at 6 PM.    [provider]  FIBER PO Take by mouth. Fiber Well Sugar -Free Gummies-Take 2 daily    [provider]  fish oil-omega-3 fatty acids 1000 MG capsule Take 1 g by mouth daily.    [provider]  mometasone (ELOCON) 0.1 % cream Apply  twice daily to affected areas on hands for rash/itching as needed only. Patient not taking: Reported on 06/16/2023 05/06/23   Willeen Niece, MD  Multiple Vitamin (MULTIVITAMIN) tablet Take 1 tablet by mouth. Women's MVI-Take 2 chewables daily    [provider]  topiramate (TOPAMAX) 100 MG tablet Take 100 mg by mouth daily at 6 PM. 03/28/15   [provider]  valACYclovir (VALTREX) 1000 MG tablet Take 2 pills at first sign of symptoms, repeat in 12 hours for 1 day dose. Repeat PRN flares. 02/26/22   Willeen Niece, MD  zolpidem  (AMBIEN) 10 MG tablet TAKE 1 TABLET (10 MG TOTAL) BY MOUTH AT BEDTIME AS NEEDED. FOR SLEEP 05/12/23   Excell Seltzer, MD      Allergies    Patient has no known allergies.    Review of Systems   Review of Systems  Constitutional:  Negative for chills and fever.  HENT:  Negative for sore throat.   Eyes:  Negative for redness.  Respiratory:  Positive for cough. Negative for shortness of breath.   Cardiovascular:  Positive for chest pain. Negative for leg swelling.  Gastrointestinal:  Negative for abdominal pain, nausea and vomiting.  Genitourinary:  Negative for flank pain.  Musculoskeletal:  Negative for back pain and neck pain.  Skin:  Negative for rash.  Neurological:  Negative for headaches.    Physical Exam Updated Vital Signs BP 136/86   Pulse 72   Temp 98.1 F (36.7 C)   Resp 19   LMP 11/10/1996   SpO2 100%  Physical Exam Vitals and nursing note reviewed.  Constitutional:      Appearance: Normal appearance. She is well-developed.  HENT:     Head: Atraumatic.     Nose: Nose normal.     Mouth/Throat:     Mouth: Mucous membranes are moist.  Eyes:     General: No scleral icterus.    Conjunctiva/sclera: Conjunctivae normal.  Neck:  Trachea: No tracheal deviation.  Cardiovascular:     Rate and Rhythm: Normal rate and regular rhythm.     Pulses: Normal pulses.     Heart sounds: Normal heart sounds. No murmur heard.    No friction rub. No gallop.  Pulmonary:     Effort: Pulmonary effort is normal. No respiratory distress.     Breath sounds: Normal breath sounds.  Chest:     Chest wall: No tenderness.  Abdominal:     General: There is no distension.     Palpations: Abdomen is soft.     Tenderness: There is no abdominal tenderness.  Musculoskeletal:        General: No swelling or tenderness.     Cervical back: Normal range of motion and neck supple. No rigidity. No muscular tenderness.     Right lower leg: No edema.     Left lower leg: No edema.  Skin:     General: Skin is warm and dry.     Findings: No rash.  Neurological:     Mental Status: She is alert.     Comments: Alert, speech normal.   Psychiatric:        Mood and Affect: Mood normal.     ED Results / Procedures / Treatments   Labs (all labs ordered are listed, but only abnormal results are displayed) Results for orders placed or performed during the hospital encounter of 06/25/23  CBC  Result Value Ref Range   WBC 4.3 4.0 - 10.5 K/uL   RBC 4.27 3.87 - 5.11 MIL/uL   Hemoglobin 13.5 12.0 - 15.0 g/dL   HCT 78.2 95.6 - 21.3 %   MCV 95.3 80.0 - 100.0 fL   MCH 31.6 26.0 - 34.0 pg   MCHC 33.2 30.0 - 36.0 g/dL   RDW 08.6 57.8 - 46.9 %   Platelets 144 (L) 150 - 400 K/uL   nRBC 0.0 0.0 - 0.2 %  Basic metabolic panel  Result Value Ref Range   Sodium 142 135 - 145 mmol/L   Potassium 3.5 3.5 - 5.1 mmol/L   Chloride 108 98 - 111 mmol/L   CO2 25 22 - 32 mmol/L   Glucose, Bld 95 70 - 99 mg/dL   BUN 20 8 - 23 mg/dL   Creatinine, Ser 6.29 0.44 - 1.00 mg/dL   Calcium 9.5 8.9 - 52.8 mg/dL   GFR, Estimated >41 >32 mL/min   Anion gap 9 5 - 15  D-dimer, quantitative  Result Value Ref Range   D-Dimer, Quant <0.27 0.00 - 0.50 ug/mL-FEU  Troponin I (High Sensitivity)  Result Value Ref Range   Troponin I (High Sensitivity) 3 <18 ng/L   DG Chest Port 1 View  Result Date: 06/25/2023 CLINICAL DATA:  Chest pain and cough EXAM: PORTABLE CHEST 1 VIEW COMPARISON:  None Available. FINDINGS: The heart size and mediastinal contours are within normal limits. No consolidation, pneumothorax or effusion. No edema. Slight linear opacity at the bases likely scar or atelectasis. The visualized skeletal structures are unremarkable. Overlapping cardiac leads. IMPRESSION: Slight basilar atelectasis. Electronically Signed   By: Karen Kays M.D.   On: 06/25/2023 11:04   MM 3D SCREENING MAMMOGRAM BILATERAL BREAST  Result Date: 06/01/2023 CLINICAL DATA:  Screening. EXAM: DIGITAL SCREENING BILATERAL MAMMOGRAM  WITH TOMOSYNTHESIS AND CAD TECHNIQUE: Bilateral screening digital craniocaudal and mediolateral oblique mammograms were obtained. Bilateral screening digital breast tomosynthesis was performed. The images were evaluated with computer-aided detection. COMPARISON:  Previous exam(s). ACR Breast Density  Category b: There are scattered areas of fibroglandular density. FINDINGS: There are no findings suspicious for malignancy. IMPRESSION: No mammographic evidence of malignancy. A result letter of this screening mammogram will be mailed directly to the patient. RECOMMENDATION: Screening mammogram in one year. (Code:SM-B-01Y) BI-RADS CATEGORY  1: Negative. Electronically Signed   By: Elberta Fortis M.D.   On: 06/01/2023 10:55     EKG EKG Interpretation Date/Time:  Thursday June 25 2023 10:02:19 EDT Ventricular Rate:  60 PR Interval:  170 QRS Duration:  93 QT Interval:  402 QTC Calculation: 402 R Axis:   73  Text Interpretation: Sinus rhythm Confirmed by Cathren Laine ([id number]) on 06/25/2023 10:19:09 AM  Radiology DG Chest Port 1 View  Result Date: 06/25/2023 CLINICAL DATA:  Chest pain and cough EXAM: PORTABLE CHEST 1 VIEW COMPARISON:  None Available. FINDINGS: The heart size and mediastinal contours are within normal limits. No consolidation, pneumothorax or effusion. No edema. Slight linear opacity at the bases likely scar or atelectasis. The visualized skeletal structures are unremarkable. Overlapping cardiac leads. IMPRESSION: Slight basilar atelectasis. Electronically Signed   By: Karen Kays M.D.   On: 06/25/2023 11:04    Procedures Procedures    Medications Ordered in ED Medications  acetaminophen (TYLENOL) tablet 1,000 mg (1,000 mg Oral Given 06/25/23 1024)    ED Course/ Medical Decision Making/ A&P                                 Medical Decision Making Problems Addressed: Chest wall pain: acute illness or injury COVID-19 virus infection: acute illness or injury with systemic  symptoms that poses a threat to life or bodily functions Precordial chest pain: acute illness or injury with systemic symptoms that poses a threat to life or bodily functions  Amount and/or Complexity of Data Reviewed External Data Reviewed: notes. Labs: ordered. Decision-making details documented in ED Course. Radiology: ordered and independent interpretation performed. Decision-making details documented in ED Course. ECG/medicine tests: ordered and independent interpretation performed. Decision-making details documented in ED Course.  Risk OTC drugs. Decision regarding hospitalization.   Iv ns. Continuous pulse ox and cardiac monitoring. Labs ordered/sent. Imaging ordered.   Differential diagnosis includes acs, msk cp, gi cp, etc. Dispo decision including potential need for admission considered - will get labs and imaging and reassess.   Reviewed nursing notes and prior charts for additional history. External reports reviewed.   Cardiac monitor: sinus rhythm, rate 68.  Labs reviewed/interpreted by me - trop normal. After symptoms for past few days, felt not c/w acs. Ddimer is normal. Wbc normal.  Xrays reviewed/interpreted by me - no pna.  Pt breathing comfortably, no distress, rr 14, pulse ox 100%. Aspects of pt's cp appear musculoskeletal in nature, and/or possibly related to current covid infection.  Pt currently appears stable for d/c.   Rec close pcp f/u.  Return precautions provided.          Final Clinical Impression(s) / ED Diagnoses Final diagnoses:  COVID-19 virus infection  Precordial chest pain  Chest wall pain    Rx / DC Orders ED Discharge Orders     None         Cathren Laine, MD 06/25/23 1140

## 2023-06-25 NOTE — Telephone Encounter (Signed)
Pt has checked in at [facility] ED.

## 2023-06-25 NOTE — ED Triage Notes (Signed)
Pt c/o L sided CP onset Monday, denies NV but states it "goes a little bit up into my neck." Pain worse w movement, pt states it feels "like a pulled muscle especially when I move." Pt diagnosed w covid on 8/6, symptom onset 8/5.

## 2023-07-06 DIAGNOSIS — G43719 Chronic migraine without aura, intractable, without status migrainosus: Secondary | ICD-10-CM | POA: Diagnosis not present

## 2023-11-27 ENCOUNTER — Ambulatory Visit (INDEPENDENT_AMBULATORY_CARE_PROVIDER_SITE_OTHER): Payer: BC Managed Care – PPO | Admitting: Family Medicine

## 2023-11-27 ENCOUNTER — Encounter: Payer: Self-pay | Admitting: Family Medicine

## 2023-11-27 VITALS — BP 90/70 | HR 70 | Temp 97.3°F | Ht 63.5 in | Wt 124.5 lb

## 2023-11-27 DIAGNOSIS — Z862 Personal history of diseases of the blood and blood-forming organs and certain disorders involving the immune mechanism: Secondary | ICD-10-CM | POA: Diagnosis not present

## 2023-11-27 DIAGNOSIS — G43001 Migraine without aura, not intractable, with status migrainosus: Secondary | ICD-10-CM | POA: Diagnosis not present

## 2023-11-27 DIAGNOSIS — Z0000 Encounter for general adult medical examination without abnormal findings: Secondary | ICD-10-CM | POA: Diagnosis not present

## 2023-11-27 DIAGNOSIS — R739 Hyperglycemia, unspecified: Secondary | ICD-10-CM

## 2023-11-27 DIAGNOSIS — E782 Mixed hyperlipidemia: Secondary | ICD-10-CM | POA: Diagnosis not present

## 2023-11-27 DIAGNOSIS — F5104 Psychophysiologic insomnia: Secondary | ICD-10-CM

## 2023-11-27 DIAGNOSIS — M8580 Other specified disorders of bone density and structure, unspecified site: Secondary | ICD-10-CM | POA: Insufficient documentation

## 2023-11-27 LAB — LIPID PANEL
Cholesterol: 191 mg/dL (ref 0–200)
HDL: 76.1 mg/dL (ref >39.00)
LDL Cholesterol: 105 mg/dL — ABNORMAL HIGH (ref 0–99)
NonHDL: 114.56
Total CHOL/HDL Ratio: 3
Triglycerides: 46 mg/dL (ref 0.0–149.0)
VLDL: 9.2 mg/dL (ref 0.0–40.0)

## 2023-11-27 LAB — CBC WITH DIFFERENTIAL/PLATELET
Basophils Absolute: 0 10*3/uL (ref 0.0–0.1)
Basophils Relative: 0.6 % (ref 0.0–3.0)
Eosinophils Absolute: 0.1 10*3/uL (ref 0.0–0.7)
Eosinophils Relative: 2.1 % (ref 0.0–5.0)
HCT: 39.9 % (ref 36.0–46.0)
Hemoglobin: 13.3 g/dL (ref 12.0–15.0)
Lymphocytes Relative: 36 % (ref 12.0–46.0)
Lymphs Abs: 1.5 10*3/uL (ref 0.7–4.0)
MCHC: 33.4 g/dL (ref 30.0–36.0)
MCV: 96.8 fL (ref 78.0–100.0)
Monocytes Absolute: 0.5 10*3/uL (ref 0.1–1.0)
Monocytes Relative: 11.7 % (ref 3.0–12.0)
Neutro Abs: 2.1 10*3/uL (ref 1.4–7.7)
Neutrophils Relative %: 49.6 % (ref 43.0–77.0)
Platelets: 133 10*3/uL — ABNORMAL LOW (ref 150.0–400.0)
RBC: 4.12 Mil/uL (ref 3.87–5.11)
RDW: 13 % (ref 11.5–15.5)
WBC: 4.2 10*3/uL (ref 4.0–10.5)

## 2023-11-27 LAB — COMPREHENSIVE METABOLIC PANEL
ALT: 12 U/L (ref 0–35)
AST: 16 U/L (ref 0–37)
Albumin: 4.4 g/dL (ref 3.5–5.2)
Alkaline Phosphatase: 64 U/L (ref 39–117)
BUN: 24 mg/dL — ABNORMAL HIGH (ref 6–23)
CO2: 28 meq/L (ref 19–32)
Calcium: 9.4 mg/dL (ref 8.4–10.5)
Chloride: 109 meq/L (ref 96–112)
Creatinine, Ser: 0.91 mg/dL (ref 0.40–1.20)
GFR: 66.88 mL/min (ref >60.00)
Glucose, Bld: 92 mg/dL (ref 70–99)
Potassium: 3.9 meq/L (ref 3.5–5.1)
Sodium: 142 meq/L (ref 135–145)
Total Bilirubin: 0.4 mg/dL (ref 0.2–1.2)
Total Protein: 6.9 g/dL (ref 6.0–8.3)

## 2023-11-27 LAB — HEMOGLOBIN A1C: Hgb A1c MFr Bld: 5.9 % (ref 4.6–6.5)

## 2023-11-27 MED ORDER — ATORVASTATIN CALCIUM 10 MG PO TABS: 10.0000 mg | ORAL_TABLET | Freq: Every day | ORAL | 3 refills | Status: DC

## 2023-11-27 NOTE — Assessment & Plan Note (Signed)
Chronic, well-controlled.  Using sumatriptan for rescue. Topamax 100 mg p.o. daily for prophylaxis.. rarely having migraine Followed by neurology,  [facility].

## 2023-11-27 NOTE — Assessment & Plan Note (Signed)
Chronic, well-controlled on Ambien 10 mg p.o. nightly..usually using 3 times a week, using a small amount.

## 2023-11-27 NOTE — Patient Instructions (Signed)
Please stop at the lab to have labs drawn.  

## 2023-11-27 NOTE — Progress Notes (Signed)
Patient ID: DREANA ORTLIP, female    DOB: 07-22-1961, 63 y.o.   MRN: [id number]  This visit was conducted in person.  BP 90/70 (BP Location: Left Arm, Patient Position: Sitting, Cuff Size: Normal)   Pulse 70   Temp (!) 97.3 F (36.3 C) (Temporal)   Ht 5' 3.5" (1.613 m)   Wt 124 lb 8 oz (56.5 kg)   LMP 11/10/1996   SpO2 99%   BMI 21.71 kg/m    CC:  Chief Complaint  Patient presents with   Annual Exam    Subjective:   HPI: Darlene Spencer is a 63 y.o. female presenting on 11/27/2023 for Annual Exam  The patient presents for, complete physical and review of chronic health problems. He/She also has the following acute concerns today: none  BP Readings from Last 3 Encounters:  11/27/23 90/70  06/25/23 129/84  06/16/23 118/62   Wt Readings from Last 3 Encounters:  11/27/23 124 lb 8 oz (56.5 kg)  06/16/23 122 lb (55.3 kg)  05/25/23 122 lb 9.6 oz (55.6 kg)    Elevated Cholesterol: Due for reevaluation on atorvastatin 10 mg p.o. daily Lab Results  Component Value Date   CHOL 198 03/17/2023   HDL 78.00 03/17/2023   LDLCALC 102 (H) 03/17/2023   LDLDIRECT 139.1 05/09/2013   TRIG 90.0 03/17/2023   CHOLHDL 3 03/17/2023  Using medications without problems: Muscle aches:  Diet compliance: Exercise: walking daily Other complaints: Family history of premature coronary artery disease  Migraine: On Topamax 100 mg p.o. daily for prophylaxis Followed by neurology,  [facility]. . Chronic insomnia: Well-controlled on Ambien 10 mg p.o. nightly prn once in a while.   History of thrombocytopenia/leukopenia:  stable  5 months ago at hospital for COVID  Relevant past medical, surgical, family and social history reviewed and updated as indicated. Interim medical history since our last visit reviewed. Allergies and medications reviewed and updated. Outpatient Medications Prior to Visit  Medication Sig Dispense Refill   atorvastatin (LIPITOR) 10 MG tablet Take 1 tablet  (10 mg total) by mouth daily. 90 tablet 3   Calcium Carbonate-Vitamin D 600-400 MG-UNIT tablet Take 1 tablet by mouth daily at 6 PM.     FIBER PO Take by mouth. Fiber Well Sugar -Free Gummies-Take 2 daily     fish oil-omega-3 fatty acids 1000 MG capsule Take 1 g by mouth daily.     mometasone (ELOCON) 0.1 % cream Apply  twice daily to affected areas on hands for rash/itching as needed only. 45 g 1   Multiple Vitamin (MULTIVITAMIN) tablet Take 1 tablet by mouth. Women's MVI-Take 2 chewables daily     topiramate (TOPAMAX) 100 MG tablet Take 100 mg by mouth daily at 6 PM.     valACYclovir (VALTREX) 1000 MG tablet Take 2 pills at first sign of symptoms, repeat in 12 hours for 1 day dose. Repeat PRN flares. 30 tablet 11   zolpidem (AMBIEN) 10 MG tablet TAKE 1 TABLET (10 MG TOTAL) BY MOUTH AT BEDTIME AS NEEDED. FOR SLEEP 30 tablet 0   Facility-Administered Medications Prior to Visit  Medication Dose Route Frequency Provider Last Rate Last Admin   0.9 %  sodium chloride infusion  500 mL Intravenous Continuous Iva Boop, MD         Per HPI unless specifically indicated in ROS section below Review of Systems  Constitutional:  Negative for fatigue and fever.  HENT:  Negative for congestion.   Eyes:  Negative  for pain.  Respiratory:  Negative for cough and shortness of breath.   Cardiovascular:  Negative for chest pain, palpitations and leg swelling.  Gastrointestinal:  Negative for abdominal pain.  Genitourinary:  Negative for dysuria and vaginal bleeding.  Musculoskeletal:  Negative for back pain.  Neurological:  Negative for syncope, light-headedness and headaches.  Psychiatric/Behavioral:  Negative for dysphoric mood.    Objective:  BP 90/70 (BP Location: Left Arm, Patient Position: Sitting, Cuff Size: Normal)   Pulse 70   Temp (!) 97.3 F (36.3 C) (Temporal)   Ht 5' 3.5" (1.613 m)   Wt 124 lb 8 oz (56.5 kg)   LMP 11/10/1996   SpO2 99%   BMI 21.71 kg/m   Wt Readings from Last 3  Encounters:  11/27/23 124 lb 8 oz (56.5 kg)  06/16/23 122 lb (55.3 kg)  05/25/23 122 lb 9.6 oz (55.6 kg)      Wt Readings from Last 3 Encounters:  11/27/23 124 lb 8 oz (56.5 kg)  06/16/23 122 lb (55.3 kg)  05/25/23 122 lb 9.6 oz (55.6 kg)    Physical Exam Constitutional:      General: She is not in acute distress.    Appearance: Normal appearance. She is well-developed. She is not ill-appearing or toxic-appearing.  HENT:     Head: Normocephalic.     Right Ear: Hearing, tympanic membrane, ear canal and external ear normal. Tympanic membrane is not erythematous, retracted or bulging.     Left Ear: Hearing, tympanic membrane, ear canal and external ear normal. Tympanic membrane is not erythematous, retracted or bulging.     Nose: No mucosal edema or rhinorrhea.     Right Sinus: No maxillary sinus tenderness or frontal sinus tenderness.     Left Sinus: No maxillary sinus tenderness or frontal sinus tenderness.     Mouth/Throat:     Pharynx: Uvula midline.  Eyes:     General: Lids are normal. Lids are everted, no foreign bodies appreciated.     Conjunctiva/sclera: Conjunctivae normal.     Pupils: Pupils are equal, round, and reactive to light.  Neck:     Thyroid: No thyroid mass or thyromegaly.     Vascular: No carotid bruit.     Trachea: Trachea normal.  Cardiovascular:     Rate and Rhythm: Normal rate and regular rhythm.     Pulses: Normal pulses.     Heart sounds: Normal heart sounds, S1 normal and S2 normal. No murmur heard.    No friction rub. No gallop.  Pulmonary:     Effort: Pulmonary effort is normal. No tachypnea or respiratory distress.     Breath sounds: Normal breath sounds. No decreased breath sounds, wheezing, rhonchi or rales.  Abdominal:     General: Bowel sounds are normal.     Palpations: Abdomen is soft.     Tenderness: There is no abdominal tenderness.  Musculoskeletal:     Cervical back: Normal range of motion and neck supple.  Skin:    General: Skin  is warm and dry.     Findings: No rash.  Neurological:     Mental Status: She is alert.  Psychiatric:        Mood and Affect: Mood is not anxious or depressed.        Speech: Speech normal.        Behavior: Behavior normal. Behavior is cooperative.        Thought Content: Thought content normal.  Judgment: Judgment normal.       Results for orders placed or performed during the hospital encounter of 06/25/23  CBC   Collection Time: 06/25/23 10:09 AM  Result Value Ref Range   WBC 4.3 4.0 - 10.5 K/uL   RBC 4.27 3.87 - 5.11 MIL/uL   Hemoglobin 13.5 12.0 - 15.0 g/dL   HCT 08.6 57.8 - 46.9 %   MCV 95.3 80.0 - 100.0 fL   MCH 31.6 26.0 - 34.0 pg   MCHC 33.2 30.0 - 36.0 g/dL   RDW 62.9 52.8 - 41.3 %   Platelets 144 (L) 150 - 400 K/uL   nRBC 0.0 0.0 - 0.2 %  Basic metabolic panel   Collection Time: 06/25/23 10:09 AM  Result Value Ref Range   Sodium 142 135 - 145 mmol/L   Potassium 3.5 3.5 - 5.1 mmol/L   Chloride 108 98 - 111 mmol/L   CO2 25 22 - 32 mmol/L   Glucose, Bld 95 70 - 99 mg/dL   BUN 20 8 - 23 mg/dL   Creatinine, Ser 2.44 0.44 - 1.00 mg/dL   Calcium 9.5 8.9 - 01.0 mg/dL   GFR, Estimated >27 >25 mL/min   Anion gap 9 5 - 15  D-dimer, quantitative   Collection Time: 06/25/23 10:09 AM  Result Value Ref Range   D-Dimer, Quant <0.27 0.00 - 0.50 ug/mL-FEU  Troponin I (High Sensitivity)   Collection Time: 06/25/23 10:09 AM  Result Value Ref Range   Troponin I (High Sensitivity) 3 <18 ng/L    Assessment and Planr5  The patient's preventative maintenance and recommended screening tests for an annual wellness exam were reviewed in full today. Brought up to date unless services declined.  Counselled on the importance of diet, exercise, and its role in overall health and mortality. The patient's FH and SH was reviewed, including their home life, tobacco status, and drug and alcohol status.   Vaccines: Tdap uptodate, shingrix series done Pap/DVE:  TAH for  endometriosis, no pap indicated. Mammo:   nml 05/2023 Bone Density: osteopenia -1.8 04/2023 Colon: 2018, repeat in 10 years.  Smoking Status:  none ETOH/ drug use: none/none  Hep C:  done    Routine general medical examination at a health care facility  Mixed hyperlipidemia Assessment & Plan: Die for re-eval, chronic.  Continue current medication.   Atorvastatin 10 mg daily  Orders: -     Lipid panel -     Comprehensive metabolic panel  History of thrombocytopenia Assessment & Plan:  Stable, chornic.   Orders: -     CBC with Differential/Platelet  Migraine without aura and with status migrainosus, not intractable Assessment & Plan: Chronic, well-controlled.  Using sumatriptan for rescue. Topamax 100 mg p.o. daily for prophylaxis.. rarely having migraine Followed by neurology,  [facility].   Chronic insomnia Assessment & Plan: Chronic, well-controlled on Ambien 10 mg p.o. nightly..usually using 3 times a week, using a small amount.     Osteopenia, unspecified location Assessment & Plan: osteopenia -1.8 04/2023  Recommend weight bearing exercise, calcium in diet and vit D supplement 400 IU 1-2 times daily.    Hyperglycemia -     Hemoglobin A1c     No follow-ups on file.   Kerby Nora, MD

## 2023-11-27 NOTE — Assessment & Plan Note (Addendum)
osteopenia -1.8 04/2023  Recommend weight bearing exercise, calcium in diet and vit D supplement 400 IU 1-2 times daily.

## 2023-11-27 NOTE — Assessment & Plan Note (Signed)
Die for re-eval, chronic.  Continue current medication.   Atorvastatin 10 mg daily

## 2023-11-27 NOTE — Assessment & Plan Note (Addendum)
Stable, chornic.

## 2023-12-15 DIAGNOSIS — G8918 Other acute postprocedural pain: Secondary | ICD-10-CM | POA: Diagnosis not present

## 2023-12-15 DIAGNOSIS — M21612 Bunion of left foot: Secondary | ICD-10-CM | POA: Diagnosis not present

## 2024-01-05 DIAGNOSIS — L820 Inflamed seborrheic keratosis: Secondary | ICD-10-CM | POA: Diagnosis not present

## 2024-01-05 DIAGNOSIS — L578 Other skin changes due to chronic exposure to nonionizing radiation: Secondary | ICD-10-CM | POA: Diagnosis not present

## 2024-01-05 DIAGNOSIS — R208 Other disturbances of skin sensation: Secondary | ICD-10-CM | POA: Diagnosis not present

## 2024-01-05 DIAGNOSIS — D2261 Melanocytic nevi of right upper limb, including shoulder: Secondary | ICD-10-CM | POA: Diagnosis not present

## 2024-01-05 DIAGNOSIS — D2262 Melanocytic nevi of left upper limb, including shoulder: Secondary | ICD-10-CM | POA: Diagnosis not present

## 2024-01-05 DIAGNOSIS — L538 Other specified erythematous conditions: Secondary | ICD-10-CM | POA: Diagnosis not present

## 2024-01-05 DIAGNOSIS — D225 Melanocytic nevi of trunk: Secondary | ICD-10-CM | POA: Diagnosis not present

## 2024-01-11 DIAGNOSIS — G43719 Chronic migraine without aura, intractable, without status migrainosus: Secondary | ICD-10-CM | POA: Diagnosis not present

## 2024-01-29 ENCOUNTER — Encounter: Payer: Self-pay | Admitting: Family Medicine

## 2024-01-29 DIAGNOSIS — B009 Herpesviral infection, unspecified: Secondary | ICD-10-CM

## 2024-01-29 DIAGNOSIS — M21612 Bunion of left foot: Secondary | ICD-10-CM | POA: Diagnosis not present

## 2024-02-01 ENCOUNTER — Other Ambulatory Visit: Payer: Self-pay | Admitting: Family Medicine

## 2024-02-01 DIAGNOSIS — B009 Herpesviral infection, unspecified: Secondary | ICD-10-CM

## 2024-02-01 NOTE — Telephone Encounter (Signed)
 Copied from CRM [id number]. Topic: Clinical - Medication Refill >> Feb 01, 2024  9:06 AM Efraim Kaufmann C wrote: Most Recent Primary Care Visit:  Provider: Kerby Nora E  Department: [facility]  Visit Type: PHYSICAL  Date: 11/27/2023  Medication: valACYclovir (VALTREX) 1000 MG tablet  Has the patient contacted their pharmacy? No (Agent: If no, request that the patient contact the pharmacy for the refill. If patient does not wish to contact the pharmacy document the reason why and proceed with request.) (Agent: If yes, when and what did the pharmacy advise?)  Is this the correct pharmacy for this prescription? Yes If no, delete pharmacy and type the correct one.  This is the patient's preferred pharmacy:   [facility] [phone] [facility] [place] - [address] [place] [address] Phone: [phone] Fax: [phone]   Has the prescription been filled recently? No  Is the patient out of the medication? Yes  Has the patient been seen for an appointment in the last year OR does the patient have an upcoming appointment? Yes  Can we respond through MyChart? Yes Note- medication was last filled by patient's dermatologist, however she stated her mouth suddenly broke out in fever blisters and since the medication was already on her chart her primary care doctor had said she would fill it for her if needed.   Agent: Please be advised that Rx refills may take up to 3 business days. We ask that you follow-up with your pharmacy.

## 2024-02-01 NOTE — Telephone Encounter (Signed)
 Duplicate request

## 2024-02-01 NOTE — Telephone Encounter (Signed)
 Last office visit 11/27/2023 for CPE.  Last refilled 02/26/2022 for #30 with 11 refills  by Willeen Niece., MD.  Next Appt: No future appointments.

## 2024-02-02 ENCOUNTER — Other Ambulatory Visit: Payer: Self-pay

## 2024-02-02 DIAGNOSIS — B009 Herpesviral infection, unspecified: Secondary | ICD-10-CM

## 2024-02-02 MED ORDER — VALACYCLOVIR HCL 1 G PO TABS
ORAL_TABLET | ORAL | 2 refills | Status: AC
Start: 2024-02-02 — End: ?

## 2024-02-02 NOTE — Progress Notes (Signed)
 Patient left voicemail request RF of medication. RX sent in. aw

## 2024-02-29 DIAGNOSIS — M21612 Bunion of left foot: Secondary | ICD-10-CM | POA: Diagnosis not present

## 2024-03-25 ENCOUNTER — Ambulatory Visit: Payer: Self-pay | Admitting: *Deleted

## 2024-03-25 ENCOUNTER — Encounter: Payer: Self-pay | Admitting: Family Medicine

## 2024-03-25 ENCOUNTER — Ambulatory Visit: Admitting: Family Medicine

## 2024-03-25 VITALS — BP 110/74 | HR 61 | Temp 98.9°F | Ht 63.5 in | Wt 123.2 lb

## 2024-03-25 DIAGNOSIS — M25551 Pain in right hip: Secondary | ICD-10-CM | POA: Diagnosis not present

## 2024-03-25 DIAGNOSIS — R350 Frequency of micturition: Secondary | ICD-10-CM | POA: Diagnosis not present

## 2024-03-25 DIAGNOSIS — N9089 Other specified noninflammatory disorders of vulva and perineum: Secondary | ICD-10-CM | POA: Diagnosis not present

## 2024-03-25 LAB — POC URINALSYSI DIPSTICK (AUTOMATED)
Bilirubin, UA: NEGATIVE
Blood, UA: NEGATIVE
Glucose, UA: NEGATIVE
Ketones, UA: NEGATIVE
Leukocytes, UA: NEGATIVE
Nitrite, UA: NEGATIVE
Protein, UA: NEGATIVE
Spec Grav, UA: <=1.005 — AB (ref 1.010–1.025)
Urobilinogen, UA: 0.2 U/dL
pH, UA: 7 (ref 5.0–8.0)

## 2024-03-25 MED ORDER — MELOXICAM 15 MG PO TABS: 15.0000 mg | ORAL_TABLET | Freq: Every day | ORAL | 0 refills | Status: DC

## 2024-03-25 NOTE — Telephone Encounter (Signed)
 Copied from CRM [id number]. Topic: Clinical - Red Word Triage >> Mar 25, 2024  8:11 AM Turkey A wrote: [facility] that prompted transfer to Nurse Triage: Patient is having pain on right side for about a week. Thinks it might be a UTI Reason for Disposition  Age > 60 years  Answer Assessment - Initial Assessment Questions 1. LOCATION: "Where does it hurt?"      I'm having pain in right side for a week.    I'm going out of town Wed. And want to have my urine checked.   I'm having frequency.   No constipation 2. RADIATION: "Does the pain shoot anywhere else?" (e.g., chest, back)     The pain is toward the side and back on right side.   It fluctuates in intensity.   It's always there.   3. ONSET: "When did the pain begin?" (e.g., minutes, hours or days ago)      For a week. 4. SUDDEN: "Gradual or sudden onset?"     Not asked 5. PATTERN "Does the pain come and go, or is it constant?"    - If it comes and goes: "How long does it last?" "Do you have pain now?"     (Note: Comes and goes means the pain is intermittent. It goes away completely between bouts.)    - If constant: "Is it getting better, staying the same, or getting worse?"      (Note: Constant means the pain never goes away completely; most serious pain is constant and gets worse.)      Always painful 6. SEVERITY: "How bad is the pain?"  (e.g., Scale 1-10; mild, moderate, or severe)    - MILD (1-3): Doesn't interfere with normal activities, abdomen soft and not tender to touch.     - MODERATE (4-7): Interferes with normal activities or awakens from sleep, abdomen tender to touch.     - SEVERE (8-10): Excruciating pain, doubled over, unable to do any normal activities.       Moderate 7. RECURRENT SYMPTOM: "Have you ever had this type of stomach pain before?" If Yes, ask: "When was the last time?" and "What happened that time?"      No 8. CAUSE: "What do you think is causing the stomach pain?"     No 9. RELIEVING/AGGRAVATING FACTORS:  "What makes it better or worse?" (e.g., antacids, bending or twisting motion, bowel movement)     Nothing 10. OTHER SYMPTOMS: "Do you have any other symptoms?" (e.g., back pain, diarrhea, fever, urination pain, vomiting)       I think I may have a UTI 11. PREGNANCY: "Is there any chance you are pregnant?" "When was your last menstrual period?"       N/A due to age  Protocols used: Abdominal Pain - Female-A-AH  Chief Complaint: Right lower abd pain radiating into back Symptoms: Urinary frequency Frequency: For the past week Pertinent Negatives: Patient denies blood in urine Disposition: [] ED /[] Urgent Care (no appt availability in office) / [x] Appointment(In office/virtual)/ []  [facility]/ [] Home Care/ [] Refused Recommended Disposition /[] [facility]/ []  Follow-up with PCP Additional Notes: Appt made for today at 11:20 with Dr. Cherlyn Cornet.

## 2024-03-25 NOTE — Assessment & Plan Note (Signed)
 Acute, most likely inclusion cyst/subcutaneous cyst.  Very small pinpoint in size.   No current infection or irritation.  Discussed possible course the likely inclusion cyst will take.  Encouraged her not to press on it or irritate it.  Patient reassured.

## 2024-03-25 NOTE — Assessment & Plan Note (Signed)
 Tender to palpation over right lateral ischial process.  Negative urinalysis and no sign of kidney stone or urinary infection. Most likely musculoskeletal strain.  Recommend heat, gentle stretching and anti-inflammatories as needed.  She will follow-up it is not improving as expected.

## 2024-03-25 NOTE — Progress Notes (Signed)
 Patient ID: URSULINE ROTON, female    DOB: 1961/06/23, 63 y.o.   MRN: [id number]  This visit was conducted in person.  BP 110/74   Pulse 61   Temp 98.9 F (37.2 C) (Temporal)   Ht 5' 3.5" (1.613 m)   Wt 123 lb 4 oz (55.9 kg)   LMP 11/10/1996   SpO2 96%   BMI 21.49 kg/m    CC:  Chief Complaint  Patient presents with   Urinary Frequency   Flank Pain    Right    Bump on Labia    Subjective:   HPI: LAYTOYA GUNSOLUS is a 63 y.o. female presenting on 03/25/2024 for Urinary Frequency, Flank Pain (Right ), and Bump on Labia Urinary Frequency  This is a new problem. The current episode started in the past 7 days. The patient is experiencing no pain. There has been no fever. She is Not sexually active. There is No history of pyelonephritis. Pertinent negatives include no discharge. Associated symptoms comments:  Pain in right lower posterior hip, constant.. worse after eating... pain 5/10  No change with movement.  No change in activity.. She has tried increased fluids for the symptoms. There is no history of catheterization, kidney stones, recurrent UTIs, a single kidney, urinary stasis or a urological procedure.   No constipation, no diarrhea  No abd pain   No radiation of pain to leg.    Tylenol  does npt help.  Relevant past medical, surgical, family and social history reviewed and updated as indicated. Interim medical history since our last visit reviewed. Allergies and medications reviewed and updated. Outpatient Medications Prior to Visit  Medication Sig Dispense Refill   atorvastatin  (LIPITOR) 10 MG tablet Take 1 tablet (10 mg total) by mouth daily. 90 tablet 3   Calcium  Carbonate-Vitamin D  600-400 MG-UNIT tablet Take 1 tablet by mouth daily at 6 PM.     FIBER PO Take by mouth. Fiber Well Sugar -Free Gummies-Take 2 daily     fish oil-omega-3 fatty acids 1000 MG capsule Take 1 g by mouth daily.     mometasone  (ELOCON ) 0.1 % cream Apply  twice daily to affected areas on  hands for rash/itching as needed only. 45 g 1   Multiple Vitamin (MULTIVITAMIN) tablet Take 1 tablet by mouth. Women's MVI-Take 2 chewables daily     topiramate (TOPAMAX) 100 MG tablet Take 100 mg by mouth daily at 6 PM.     valACYclovir  (VALTREX ) 1000 MG tablet Take 2 pills at first sign of symptoms, repeat in 12 hours for 1 day dose. Repeat PRN flares. 30 tablet 2   zolpidem  (AMBIEN ) 10 MG tablet TAKE 1 TABLET (10 MG TOTAL) BY MOUTH AT BEDTIME AS NEEDED. FOR SLEEP 30 tablet 0   Facility-Administered Medications Prior to Visit  Medication Dose Route Frequency Provider Last Rate Last Admin   0.9 %  sodium chloride  infusion  500 mL Intravenous Continuous Kenney Peacemaker, MD         Per HPI unless specifically indicated in ROS section below Review of Systems  Constitutional:  Negative for fatigue and fever.  HENT:  Negative for congestion.   Eyes:  Negative for pain.  Respiratory:  Negative for cough and shortness of breath.   Cardiovascular:  Negative for chest pain, palpitations and leg swelling.  Gastrointestinal:  Negative for abdominal pain.  Genitourinary:  Negative for dysuria and vaginal bleeding.  Musculoskeletal:  Negative for back pain.  Neurological:  Negative for syncope, light-headedness  and headaches.  Psychiatric/Behavioral:  Negative for dysphoric mood.    Objective:  BP 110/74   Pulse 61   Temp 98.9 F (37.2 C) (Temporal)   Ht 5' 3.5" (1.613 m)   Wt 123 lb 4 oz (55.9 kg)   LMP 11/10/1996   SpO2 96%   BMI 21.49 kg/m   Wt Readings from Last 3 Encounters:  03/25/24 123 lb 4 oz (55.9 kg)  11/27/23 124 lb 8 oz (56.5 kg)  06/16/23 122 lb (55.3 kg)      Physical Exam Constitutional:      General: She is not in acute distress.    Appearance: Normal appearance. She is well-developed. She is not ill-appearing or toxic-appearing.  HENT:     Head: Normocephalic.     Right Ear: Hearing, tympanic membrane, ear canal and external ear normal. Tympanic membrane is not  erythematous, retracted or bulging.     Left Ear: Hearing, tympanic membrane, ear canal and external ear normal. Tympanic membrane is not erythematous, retracted or bulging.     Nose: No mucosal edema or rhinorrhea.     Right Sinus: No maxillary sinus tenderness or frontal sinus tenderness.     Left Sinus: No maxillary sinus tenderness or frontal sinus tenderness.     Mouth/Throat:     Pharynx: Uvula midline.  Eyes:     General: Lids are normal. Lids are everted, no foreign bodies appreciated.     Conjunctiva/sclera: Conjunctivae normal.     Pupils: Pupils are equal, round, and reactive to light.  Neck:     Thyroid : No thyroid  mass or thyromegaly.     Vascular: No carotid bruit.     Trachea: Trachea normal.  Cardiovascular:     Rate and Rhythm: Normal rate and regular rhythm.     Pulses: Normal pulses.     Heart sounds: Normal heart sounds, S1 normal and S2 normal. No murmur heard.    No friction rub. No gallop.  Pulmonary:     Effort: Pulmonary effort is normal. No tachypnea or respiratory distress.     Breath sounds: Normal breath sounds. No decreased breath sounds, wheezing, rhonchi or rales.  Abdominal:     General: Bowel sounds are normal.     Palpations: Abdomen is soft.     Tenderness: There is no abdominal tenderness.  Genitourinary:    Comments: Small inclusion cyst on right labia, no surrounding erythema, nontender Musculoskeletal:     Cervical back: Normal, normal range of motion and neck supple.     Lumbar back: Tenderness present. No spasms or bony tenderness. Normal range of motion. Negative right straight leg raise test and negative left straight leg raise test.  Skin:    General: Skin is warm and dry.     Findings: No rash.  Neurological:     Mental Status: She is alert.  Psychiatric:        Mood and Affect: Mood is not anxious or depressed.        Speech: Speech normal.        Behavior: Behavior normal. Behavior is cooperative.        Thought Content: Thought  content normal.        Judgment: Judgment normal.       Results for orders placed or performed in visit on 03/25/24  POCT Urinalysis Dipstick (Automated)   Collection Time: 03/25/24 11:30 AM  Result Value Ref Range   Color, UA Yellow    Clarity, UA Clear  Glucose, UA Negative Negative   Bilirubin, UA Negative    Ketones, UA Negative    Spec Grav, UA <=1.005 (A) 1.010 - 1.025   Blood, UA Negative    pH, UA 7.0 5.0 - 8.0   Protein, UA Negative Negative   Urobilinogen, UA 0.2 0.2 or 1.0 E.U./dL   Nitrite, UA Negative    Leukocytes, UA Negative Negative    Assessment and Plan  Urinary frequency -     POCT Urinalysis Dipstick (Automated)  Right-sided ischial pain Assessment & Plan: Tender to palpation over right lateral ischial process.  Negative urinalysis and no sign of kidney stone or urinary infection. Most likely musculoskeletal strain.  Recommend heat, gentle stretching and anti-inflammatories as needed.  She will follow-up it is not improving as expected.   Labial lesion Assessment & Plan: Acute, most likely inclusion cyst/subcutaneous cyst.  Very small pinpoint in size.   No current infection or irritation.  Discussed possible course the likely inclusion cyst will take.  Encouraged her not to press on it or irritate it.  Patient reassured.   Other orders -     Meloxicam; Take 1 tablet (15 mg total) by mouth daily.  Dispense: 15 tablet; Refill: 0    No follow-ups on file.   Herby Lolling, MD

## 2024-03-25 NOTE — Patient Instructions (Signed)
 Start heat and gentle side stretching.  Start meloxicam 15 mg daily over the next 1-2 weeks.

## 2024-05-09 ENCOUNTER — Other Ambulatory Visit: Payer: Self-pay | Admitting: Family Medicine

## 2024-05-09 DIAGNOSIS — Z1231 Encounter for screening mammogram for malignant neoplasm of breast: Secondary | ICD-10-CM

## 2024-05-30 ENCOUNTER — Ambulatory Visit
Admission: RE | Admit: 2024-05-30 | Discharge: 2024-05-30 | Disposition: A | Discharge status: Home / Self Care | Source: Ambulatory Visit | Attending: Family Medicine | Admitting: Family Medicine

## 2024-05-30 DIAGNOSIS — Z1231 Encounter for screening mammogram for malignant neoplasm of breast: Secondary | ICD-10-CM

## 2024-06-02 ENCOUNTER — Ambulatory Visit: Payer: Self-pay | Admitting: Family Medicine

## 2024-07-15 DIAGNOSIS — G43719 Chronic migraine without aura, intractable, without status migrainosus: Secondary | ICD-10-CM | POA: Diagnosis not present

## 2024-08-11 ENCOUNTER — Ambulatory Visit (INDEPENDENT_AMBULATORY_CARE_PROVIDER_SITE_OTHER): Admitting: Family Medicine

## 2024-08-11 ENCOUNTER — Encounter: Payer: Self-pay | Admitting: Family Medicine

## 2024-08-11 VITALS — BP 90/62 | HR 75 | Temp 97.3°F | Ht 63.5 in | Wt 120.4 lb

## 2024-08-11 DIAGNOSIS — R1110 Vomiting, unspecified: Secondary | ICD-10-CM

## 2024-08-11 DIAGNOSIS — R197 Diarrhea, unspecified: Secondary | ICD-10-CM

## 2024-08-11 DIAGNOSIS — B349 Viral infection, unspecified: Secondary | ICD-10-CM | POA: Insufficient documentation

## 2024-08-11 LAB — POC INFLUENZA A&B (BINAX/QUICKVUE)
Influenza A, POC: NEGATIVE
Influenza B, POC: NEGATIVE

## 2024-08-11 LAB — POC COVID19 BINAXNOW: SARS Coronavirus 2 Ag: NEGATIVE

## 2024-08-11 NOTE — Assessment & Plan Note (Signed)
 Acute, negative COVID and flu test in office.  Likely symptoms secondary to other viral illness. Encouraged patient to gradually increase p.o. intake of water or Gatorade.  Continue rest and symptomatic care.  Return and ER precautions provided. Cleared to return to work once nausea and vomiting resolved as patient afebrile.

## 2024-08-11 NOTE — Progress Notes (Signed)
 Patient ID: Darlene Spencer, female    DOB: July 20, 1961, 63 y.o.   MRN: [id number]  This visit was conducted in person.  BP 90/62   Pulse 75   Temp (!) 97.3 F (36.3 C) (Temporal)   Ht 5' 3.5 (1.613 m)   Wt 120 lb 6 oz (54.6 kg)   LMP 11/10/1996   SpO2 96%   BMI 20.99 kg/m    CC:  Chief Complaint  Patient presents with   Emesis   Diarrhea   Fatigue        Chills   Nausea   Sore Throat    Subjective:   HPI: Darlene Spencer is a 63 y.o. female presenting on 08/11/2024 for Emesis, Diarrhea, Fatigue (/), Chills, Nausea, and Sore Throat   Date of onset: 2 days Initial symptoms included  ST, nasal congestion Symptoms progressed to chills, N/V/D  Fatigued.  Mils cough, dry.  No SOB, no wheeze. Sleeping at night.  Low grade fever at home.   Decreased po intake,  Has been able to keep down 1 Dr pepper today.   No abd pain     Sick contacts: COVID at work COVID testing:   none     She has tried to treat with  mucinex     No history of chronic lung disease such as asthma or COPD. Non-smoker.     Relevant past medical, surgical, family and social history reviewed and updated as indicated. Interim medical history since our last visit reviewed. Allergies and medications reviewed and updated. Outpatient Medications Prior to Visit  Medication Sig Dispense Refill   atorvastatin  (LIPITOR) 10 MG tablet Take 1 tablet (10 mg total) by mouth daily. 90 tablet 3   Calcium  Carbonate-Vitamin D  600-400 MG-UNIT tablet Take 1 tablet by mouth daily at 6 PM.     FIBER PO Take by mouth. Fiber Well Sugar -Free Gummies-Take 2 daily     fish oil-omega-3 fatty acids 1000 MG capsule Take 1 g by mouth daily.     meloxicam  (MOBIC ) 15 MG tablet Take 1 tablet (15 mg total) by mouth daily. 15 tablet 0   mometasone  (ELOCON ) 0.1 % cream Apply  twice daily to affected areas on hands for rash/itching as needed only. 45 g 1   Multiple Vitamin (MULTIVITAMIN) tablet Take 1 tablet by mouth.  Women's MVI-Take 2 chewables daily     topiramate (TOPAMAX) 100 MG tablet Take 100 mg by mouth daily at 6 PM.     valACYclovir  (VALTREX ) 1000 MG tablet Take 2 pills at first sign of symptoms, repeat in 12 hours for 1 day dose. Repeat PRN flares. 30 tablet 2   zolpidem  (AMBIEN ) 10 MG tablet TAKE 1 TABLET (10 MG TOTAL) BY MOUTH AT BEDTIME AS NEEDED. FOR SLEEP 30 tablet 0   Facility-Administered Medications Prior to Visit  Medication Dose Route Frequency Provider Last Rate Last Admin   0.9 %  sodium chloride  infusion  500 mL Intravenous Continuous Avram Lupita BRAVO, MD         Per HPI unless specifically indicated in ROS section below Review of Systems  Constitutional:  Positive for fatigue. Negative for fever.  HENT:  Positive for congestion.   Eyes:  Negative for pain.  Respiratory:  Negative for cough and shortness of breath.   Cardiovascular:  Negative for chest pain, palpitations and leg swelling.  Gastrointestinal:  Positive for diarrhea, nausea and vomiting. Negative for abdominal pain.  Genitourinary:  Negative for dysuria and vaginal  bleeding.  Musculoskeletal:  Negative for back pain.  Neurological:  Negative for syncope, light-headedness and headaches.  Psychiatric/Behavioral:  Negative for dysphoric mood.    Objective:  BP 90/62   Pulse 75   Temp (!) 97.3 F (36.3 C) (Temporal)   Ht 5' 3.5 (1.613 m)   Wt 120 lb 6 oz (54.6 kg)   LMP 11/10/1996   SpO2 96%   BMI 20.99 kg/m   Wt Readings from Last 3 Encounters:  08/11/24 120 lb 6 oz (54.6 kg)  03/25/24 123 lb 4 oz (55.9 kg)  11/27/23 124 lb 8 oz (56.5 kg)      Physical Exam Constitutional:      General: She is not in acute distress.    Appearance: She is well-developed. She is not ill-appearing or toxic-appearing.  HENT:     Head: Normocephalic.     Right Ear: Hearing, ear canal and external ear normal. A middle ear effusion is present. Tympanic membrane is not erythematous, retracted or bulging.     Left Ear:  Hearing, ear canal and external ear normal. A middle ear effusion is present. Tympanic membrane is not erythematous, retracted or bulging.     Nose: Mucosal edema and rhinorrhea present.     Right Sinus: No maxillary sinus tenderness or frontal sinus tenderness.     Left Sinus: No maxillary sinus tenderness or frontal sinus tenderness.     Mouth/Throat:     Pharynx: Uvula midline.  Eyes:     General: Lids are normal. Lids are everted, no foreign bodies appreciated.     Conjunctiva/sclera: Conjunctivae normal.     Pupils: Pupils are equal, round, and reactive to light.  Neck:     Thyroid : No thyroid  mass or thyromegaly.     Vascular: No carotid bruit.     Trachea: Trachea normal.  Cardiovascular:     Rate and Rhythm: Normal rate and regular rhythm.     Pulses: Normal pulses.     Heart sounds: Normal heart sounds, S1 normal and S2 normal. No murmur heard.    No friction rub. No gallop.  Pulmonary:     Effort: Pulmonary effort is normal. No tachypnea or respiratory distress.     Breath sounds: Normal breath sounds. No decreased breath sounds, wheezing, rhonchi or rales.  Musculoskeletal:     Cervical back: Normal range of motion and neck supple.  Skin:    General: Skin is warm and dry.     Findings: No rash.  Neurological:     Mental Status: She is alert.  Psychiatric:        Mood and Affect: Mood is not anxious or depressed.        Speech: Speech normal.        Behavior: Behavior normal. Behavior is cooperative.        Judgment: Judgment normal.       Results for orders placed or performed in visit on 03/25/24  POCT Urinalysis Dipstick (Automated)   Collection Time: 03/25/24 11:30 AM  Result Value Ref Range   Color, UA Yellow    Clarity, UA Clear    Glucose, UA Negative Negative   Bilirubin, UA Negative    Ketones, UA Negative    Spec Grav, UA <=1.005 (A) 1.010 - 1.025   Blood, UA Negative    pH, UA 7.0 5.0 - 8.0   Protein, UA Negative Negative   Urobilinogen, UA 0.2  0.2 or 1.0 E.U./dL   Nitrite, UA Negative  Leukocytes, UA Negative Negative    Assessment and Plan  Vomiting and diarrhea -     POC COVID-19 BinaxNow -     POC Influenza A&B(BINAX/QUICKVUE)  Acute viral syndrome Assessment & Plan: Acute, negative COVID and flu test in office.  Likely symptoms secondary to other viral illness. Encouraged patient to gradually increase p.o. intake of water or Gatorade.  Continue rest and symptomatic care.  Return and ER precautions provided. Cleared to return to work once nausea and vomiting resolved as patient afebrile.     No follow-ups on file.   Greig Ring, MD

## 2024-11-25 ENCOUNTER — Other Ambulatory Visit: Payer: Self-pay | Admitting: Family Medicine

## 2024-11-25 DIAGNOSIS — E782 Mixed hyperlipidemia: Secondary | ICD-10-CM

## 2024-11-29 ENCOUNTER — Ambulatory Visit
Admission: RE | Admit: 2024-11-29 | Discharge: 2024-11-29 | Disposition: A | Source: Ambulatory Visit | Attending: Family Medicine | Admitting: Family Medicine

## 2024-11-29 ENCOUNTER — Encounter: Payer: Self-pay | Admitting: Family Medicine

## 2024-11-29 ENCOUNTER — Ambulatory Visit: Payer: Self-pay | Admitting: Family Medicine

## 2024-11-29 ENCOUNTER — Ambulatory Visit: Admitting: Family Medicine

## 2024-11-29 VITALS — BP 114/70 | HR 64 | Temp 97.1°F | Ht 63.25 in | Wt 124.4 lb

## 2024-11-29 DIAGNOSIS — M25552 Pain in left hip: Secondary | ICD-10-CM | POA: Diagnosis not present

## 2024-11-29 DIAGNOSIS — Z23 Encounter for immunization: Secondary | ICD-10-CM | POA: Diagnosis not present

## 2024-11-29 DIAGNOSIS — E782 Mixed hyperlipidemia: Secondary | ICD-10-CM

## 2024-11-29 DIAGNOSIS — D72819 Decreased white blood cell count, unspecified: Secondary | ICD-10-CM | POA: Diagnosis not present

## 2024-11-29 DIAGNOSIS — Z0000 Encounter for general adult medical examination without abnormal findings: Secondary | ICD-10-CM

## 2024-11-29 DIAGNOSIS — M8580 Other specified disorders of bone density and structure, unspecified site: Secondary | ICD-10-CM | POA: Diagnosis not present

## 2024-11-29 DIAGNOSIS — Z862 Personal history of diseases of the blood and blood-forming organs and certain disorders involving the immune mechanism: Secondary | ICD-10-CM

## 2024-11-29 DIAGNOSIS — F5104 Psychophysiologic insomnia: Secondary | ICD-10-CM | POA: Diagnosis not present

## 2024-11-29 DIAGNOSIS — R739 Hyperglycemia, unspecified: Secondary | ICD-10-CM | POA: Diagnosis not present

## 2024-11-29 DIAGNOSIS — M5442 Lumbago with sciatica, left side: Secondary | ICD-10-CM | POA: Diagnosis not present

## 2024-11-29 DIAGNOSIS — F5101 Primary insomnia: Secondary | ICD-10-CM | POA: Insufficient documentation

## 2024-11-29 LAB — LIPID PANEL
Cholesterol: 184 mg/dL (ref 28–200)
HDL: 80 mg/dL
LDL Cholesterol: 93 mg/dL (ref 10–99)
NonHDL: 104.38
Total CHOL/HDL Ratio: 2
Triglycerides: 55 mg/dL (ref 10.0–149.0)
VLDL: 11 mg/dL (ref 0.0–40.0)

## 2024-11-29 LAB — COMPREHENSIVE METABOLIC PANEL WITH GFR
ALT: 14 U/L (ref 3–35)
AST: 19 U/L (ref 5–37)
Albumin: 4.7 g/dL (ref 3.5–5.2)
Alkaline Phosphatase: 71 U/L (ref 39–117)
BUN: 15 mg/dL (ref 6–23)
CO2: 26 meq/L (ref 19–32)
Calcium: 9.5 mg/dL (ref 8.4–10.5)
Chloride: 106 meq/L (ref 96–112)
Creatinine, Ser: 0.87 mg/dL (ref 0.40–1.20)
GFR: 70.09 mL/min
Glucose, Bld: 94 mg/dL (ref 70–99)
Potassium: 3.5 meq/L (ref 3.5–5.1)
Sodium: 140 meq/L (ref 135–145)
Total Bilirubin: 0.5 mg/dL (ref 0.2–1.2)
Total Protein: 7.6 g/dL (ref 6.0–8.3)

## 2024-11-29 LAB — CBC WITH DIFFERENTIAL/PLATELET
Basophils Absolute: 0 K/uL (ref 0.0–0.1)
Basophils Relative: 0.8 % (ref 0.0–3.0)
Eosinophils Absolute: 0.1 K/uL (ref 0.0–0.7)
Eosinophils Relative: 1.3 % (ref 0.0–5.0)
HCT: 42.6 % (ref 36.0–46.0)
Hemoglobin: 14.1 g/dL (ref 12.0–15.0)
Lymphocytes Relative: 32.4 % (ref 12.0–46.0)
Lymphs Abs: 1.5 K/uL (ref 0.7–4.0)
MCHC: 33.1 g/dL (ref 30.0–36.0)
MCV: 96 fl (ref 78.0–100.0)
Monocytes Absolute: 0.4 K/uL (ref 0.1–1.0)
Monocytes Relative: 9.2 % (ref 3.0–12.0)
Neutro Abs: 2.6 K/uL (ref 1.4–7.7)
Neutrophils Relative %: 56.3 % (ref 43.0–77.0)
Platelets: 136 K/uL — ABNORMAL LOW (ref 150.0–400.0)
RBC: 4.44 Mil/uL (ref 3.87–5.11)
RDW: 13.3 % (ref 11.5–15.5)
WBC: 4.6 K/uL (ref 4.0–10.5)

## 2024-11-29 LAB — HEMOGLOBIN A1C: Hgb A1c MFr Bld: 5.6 % (ref 4.6–6.5)

## 2024-11-29 MED ORDER — MELOXICAM 15 MG PO TABS: 15.0000 mg | ORAL_TABLET | Freq: Every day | ORAL | 0 refills | Status: AC

## 2024-11-29 MED ORDER — ATORVASTATIN CALCIUM 10 MG PO TABS: 10.0000 mg | ORAL_TABLET | Freq: Every day | ORAL | 3 refills | Status: AC

## 2024-11-29 MED ORDER — ZOLPIDEM TARTRATE 10 MG PO TABS: 10.0000 mg | ORAL_TABLET | Freq: Every evening | ORAL | 0 refills | Status: AC | PRN

## 2024-11-29 NOTE — Assessment & Plan Note (Signed)
"   Resolved  in past several years. "

## 2024-11-29 NOTE — Assessment & Plan Note (Signed)
 Die for re-eval, chronic.  Continue current medication.   Atorvastatin 10 mg daily

## 2024-11-29 NOTE — Assessment & Plan Note (Signed)
Chronic, well-controlled on Ambien 10 mg p.o. nightly..usually using 3 times a week, using a small amount.

## 2024-11-29 NOTE — Assessment & Plan Note (Signed)
 osteopenia -1.8 04/2023  Recommend weight bearing exercise, calcium in diet and vit D supplement 400 IU 1-2 times daily.

## 2024-11-29 NOTE — Assessment & Plan Note (Signed)
 Acute, good range of motion Possible musculoskeletal strain versus hip joint issue.  Evaluate with plain x-ray of left hip. Treat with NSAIDs and home physical therapy.  Follow-up if not improving as expected.

## 2024-11-29 NOTE — Progress Notes (Signed)
 "   Patient ID: Darlene Spencer, female    DOB: 03-09-1961, 64 y.o.   MRN: [id number]  This visit was conducted in person.  BP 114/70   Pulse 64   Temp (!) 97.1 F (36.2 C) (Temporal)   Ht 5' 3.25 (1.607 m)   Wt 124 lb 6 oz (56.4 kg)   LMP 11/10/1996   SpO2 99%   BMI 21.86 kg/m    CC:  Chief Complaint  Patient presents with   Annual Exam    Subjective:   HPI: Darlene Spencer is a 64 y.o. female presenting on 11/29/2024 for Annual Exam  The patient presents for, complete physical and review of chronic health problems. He/She also has the following acute concerns today:  Left low back pain.. radiates to left buttock and left thigh.  Left leg weak,heavy.  No swelling.  Ongoing x 3 months.  Left toes numb at time.   No change with  exercise... worse with lying down at night.  No falls proceeding.  Minimal meloxicam  x 2 days.   BP Readings from Last 3 Encounters:  11/29/24 114/70  08/11/24 90/62  03/25/24 110/74   Wt Readings from Last 3 Encounters:  11/29/24 124 lb 6 oz (56.4 kg)  08/11/24 120 lb 6 oz (54.6 kg)  03/25/24 123 lb 4 oz (55.9 kg)    Elevated Cholesterol: Due for reevaluation on atorvastatin  10 mg p.o. daily Lab Results  Component Value Date   CHOL 191 11/27/2023   HDL 76.10 11/27/2023   LDLCALC 105 (H) 11/27/2023   LDLDIRECT 139.1 05/09/2013   TRIG 46.0 11/27/2023   CHOLHDL 3 11/27/2023  Using medications without problems: Muscle aches:  Diet compliance: Exercise: walking daily Other complaints: Family history of premature coronary artery disease  Migraine: On Topamax 100 mg p.o. daily for prophylaxis Followed by neurology,  [facility]. . Chronic insomnia: Well-controlled on Ambien  10 mg p.o. nightly prn once in a while.   History of thrombocytopenia/leukopenia:  stable  5 months ago at hospital for COVID  Relevant past medical, surgical, family and social history reviewed and updated as indicated. Interim medical history since  our last visit reviewed. Allergies and medications reviewed and updated. Outpatient Medications Prior to Visit  Medication Sig Dispense Refill   Calcium  Carbonate-Vitamin D  600-400 MG-UNIT tablet Take 1 tablet by mouth daily at 6 PM.     FIBER PO Take by mouth. Fiber Well Sugar -Free Gummies-Take 2 daily     fish oil-omega-3 fatty acids 1000 MG capsule Take 1 g by mouth daily.     mometasone  (ELOCON ) 0.1 % cream Apply  twice daily to affected areas on hands for rash/itching as needed only. 45 g 1   Multiple Vitamin (MULTIVITAMIN) tablet Take 1 tablet by mouth. Women's MVI-Take 2 chewables daily     topiramate (TOPAMAX) 100 MG tablet Take 100 mg by mouth daily at 6 PM.     valACYclovir  (VALTREX ) 1000 MG tablet Take 2 pills at first sign of symptoms, repeat in 12 hours for 1 day dose. Repeat PRN flares. 30 tablet 2   atorvastatin  (LIPITOR) 10 MG tablet TAKE 1 TABLET BY MOUTH EVERY DAY 90 tablet 0   meloxicam  (MOBIC ) 15 MG tablet Take 1 tablet (15 mg total) by mouth daily. 15 tablet 0   zolpidem  (AMBIEN ) 10 MG tablet TAKE 1 TABLET (10 MG TOTAL) BY MOUTH AT BEDTIME AS NEEDED. FOR SLEEP 30 tablet 0   Facility-Administered Medications Prior to Visit  Medication Dose  Route Frequency Provider Last Rate Last Admin   0.9 %  sodium chloride  infusion  500 mL Intravenous Continuous Avram Lupita BRAVO, MD         Per HPI unless specifically indicated in ROS section below Review of Systems  Constitutional:  Negative for fatigue and fever.  HENT:  Negative for congestion.   Eyes:  Negative for pain.  Respiratory:  Negative for cough and shortness of breath.   Cardiovascular:  Negative for chest pain, palpitations and leg swelling.  Gastrointestinal:  Negative for abdominal pain.  Genitourinary:  Negative for dysuria and vaginal bleeding.  Musculoskeletal:  Negative for back pain.  Neurological:  Negative for syncope, light-headedness and headaches.  Psychiatric/Behavioral:  Negative for dysphoric mood.     Objective:  BP 114/70   Pulse 64   Temp (!) 97.1 F (36.2 C) (Temporal)   Ht 5' 3.25 (1.607 m)   Wt 124 lb 6 oz (56.4 kg)   LMP 11/10/1996   SpO2 99%   BMI 21.86 kg/m   Wt Readings from Last 3 Encounters:  11/29/24 124 lb 6 oz (56.4 kg)  08/11/24 120 lb 6 oz (54.6 kg)  03/25/24 123 lb 4 oz (55.9 kg)      Wt Readings from Last 3 Encounters:  11/29/24 124 lb 6 oz (56.4 kg)  08/11/24 120 lb 6 oz (54.6 kg)  03/25/24 123 lb 4 oz (55.9 kg)    Physical Exam Constitutional:      General: She is not in acute distress.    Appearance: Normal appearance. She is well-developed. She is not ill-appearing or toxic-appearing.  HENT:     Head: Normocephalic.     Right Ear: Hearing, tympanic membrane, ear canal and external ear normal. Tympanic membrane is not erythematous, retracted or bulging.     Left Ear: Hearing, tympanic membrane, ear canal and external ear normal. Tympanic membrane is not erythematous, retracted or bulging.     Nose: No mucosal edema or rhinorrhea.     Right Sinus: No maxillary sinus tenderness or frontal sinus tenderness.     Left Sinus: No maxillary sinus tenderness or frontal sinus tenderness.     Mouth/Throat:     Pharynx: Uvula midline.  Eyes:     General: Lids are normal. Lids are everted, no foreign bodies appreciated.     Conjunctiva/sclera: Conjunctivae normal.     Pupils: Pupils are equal, round, and reactive to light.  Neck:     Thyroid : No thyroid  mass or thyromegaly.     Vascular: No carotid bruit.     Trachea: Trachea normal.  Cardiovascular:     Rate and Rhythm: Normal rate and regular rhythm.     Pulses: Normal pulses.          Popliteal pulses are 2+ on the right side and 2+ on the left side.       Dorsalis pedis pulses are 2+ on the right side and 2+ on the left side.     Heart sounds: Normal heart sounds, S1 normal and S2 normal. No murmur heard.    No friction rub. No gallop.  Pulmonary:     Effort: Pulmonary effort is normal. No  tachypnea or respiratory distress.     Breath sounds: Normal breath sounds. No decreased breath sounds, wheezing, rhonchi or rales.  Abdominal:     General: Bowel sounds are normal.     Palpations: Abdomen is soft.     Tenderness: There is no abdominal tenderness.  Musculoskeletal:  Cervical back: Normal, normal range of motion and neck supple.     Thoracic back: Normal.     Lumbar back: Tenderness present. No spasms or bony tenderness. Normal range of motion. Negative right straight leg raise test and negative left straight leg raise test.     Left hip: Tenderness present. No bony tenderness. Normal range of motion.     Right lower leg: No edema.     Left lower leg: No edema.     Comments:  TTP in left anterior hip  Skin:    General: Skin is warm and dry.     Findings: No rash.  Neurological:     Mental Status: She is alert.  Psychiatric:        Mood and Affect: Mood is not anxious or depressed.        Speech: Speech normal.        Behavior: Behavior normal. Behavior is cooperative.        Thought Content: Thought content normal.        Judgment: Judgment normal.       Results for orders placed or performed in visit on 08/11/24  POC COVID-19   Collection Time: 08/11/24  2:29 PM  Result Value Ref Range   SARS Coronavirus 2 Ag Negative Negative  POC Influenza A&B (Binax test)   Collection Time: 08/11/24  2:29 PM  Result Value Ref Range   Influenza A, POC Negative Negative   Influenza B, POC Negative Negative    Assessment and Planr5  The patient's preventative maintenance and recommended screening tests for an annual wellness exam were reviewed in full today. Brought up to date unless services declined.  Counselled on the importance of diet, exercise, and its role in overall health and mortality. The patient's FH and SH was reviewed, including their home life, tobacco status, and drug and alcohol status.   Vaccines: Tdap uptodate, shingrix series done, declined COVID  and flu,  Given Prevnar 20 Pap/DVE:  TAH for endometriosis, no pap indicated. Mammo:   nml 05/2024, repeat yearly Bone Density: osteopenia -1.8 04/2023 Colon: 2018, repeat in 10 years.  Smoking Status:  none ETOH/ drug use: none/none  Hep C:  done    Routine general medical examination at a health care facility  Mixed hyperlipidemia Assessment & Plan: Die for re-eval, chronic.  Continue current medication.   Atorvastatin  10 mg daily  Orders: -     Comprehensive metabolic panel with GFR -     Lipid panel -     Atorvastatin  Calcium ; Take 1 tablet (10 mg total) by mouth daily.  Dispense: 90 tablet; Refill: 3  Leukopenia, unspecified type Assessment & Plan:  Resolved  in past several years.   History of thrombocytopenia Assessment & Plan:   [place], Due for re-eval.   Orders: -     CBC with Differential/Platelet  Osteopenia, unspecified location Assessment & Plan: osteopenia -1.8 04/2023  Recommend weight bearing exercise, calcium  in diet and vit D supplement 400 IU 1-2 times daily.    Hyperglycemia Assessment & Plan:  Due for re-eval. Lab Results  Component Value Date   HGBA1C 5.9 11/27/2023     Orders: -     Hemoglobin A1c  Need for pneumococcal 20-valent conjugate vaccination -     Pneumococcal conjugate vaccine 20-valent  Acute left-sided low back pain with left-sided sciatica Assessment & Plan: Acute but ongoing for 3 months.  No preceding injury. Good range of motion of back but tenderness in left  low back with radiation of pain to the left lower leg associated with numbness and subjective weakness.  No objective weakness.  No red flags.  Will evaluate with plain x-ray. Try home physical therapy and heat as well as 2-week course of meloxicam  daily. Offered prednisone taper but she would prefer to try the meloxicam .  If not improving consider prednisone.  Orders: -     DG Lumbar Spine Complete; Future  Acute pain of left hip Assessment &  Plan: Acute, good range of motion Possible musculoskeletal strain versus hip joint issue.  Evaluate with plain x-ray of left hip. Treat with NSAIDs and home physical therapy.  Follow-up if not improving as expected.  Orders: -     DG HIP UNILAT W OR W/O PELVIS 2-3 VIEWS LEFT; Future  Chronic insomnia Assessment & Plan: Chronic, well-controlled on Ambien  10 mg p.o. nightly..usually using 3 times a week, using a small amount.    Orders: -     Zolpidem  Tartrate; Take 1 tablet (10 mg total) by mouth at bedtime as needed. for sleep  Dispense: 30 tablet; Refill: 0  Other orders -     Meloxicam ; Take 1 tablet (15 mg total) by mouth daily.  Dispense: 30 tablet; Refill: 0      No follow-ups on file.   Greig Ring, MD  "

## 2024-11-29 NOTE — Assessment & Plan Note (Signed)
"   Due for re-eval. Lab Results  Component Value Date   HGBA1C 5.9 11/27/2023    "

## 2024-11-29 NOTE — Assessment & Plan Note (Signed)
 Acute but ongoing for 3 months.  No preceding injury. Good range of motion of back but tenderness in left low back with radiation of pain to the left lower leg associated with numbness and subjective weakness.  No objective weakness.  No red flags.  Will evaluate with plain x-ray. Try home physical therapy and heat as well as 2-week course of meloxicam  daily. Offered prednisone taper but she would prefer to try the meloxicam .  If not improving consider prednisone.

## 2024-11-29 NOTE — Assessment & Plan Note (Signed)
"    [place], Due for re-eval.  "
